# Patient Record
Sex: Female | Born: 1935
Health system: Southern US, Community
[De-identification: ages and names within clinical notes are randomized; demographics above are authoritative.]

## PROBLEM LIST (undated history)

## (undated) DIAGNOSIS — M25569 Pain in unspecified knee: Secondary | ICD-10-CM

## (undated) DIAGNOSIS — K921 Melena: Secondary | ICD-10-CM

## (undated) DIAGNOSIS — R05 Cough: Secondary | ICD-10-CM

## (undated) DIAGNOSIS — I8390 Asymptomatic varicose veins of unspecified lower extremity: Secondary | ICD-10-CM

## (undated) DIAGNOSIS — M549 Dorsalgia, unspecified: Secondary | ICD-10-CM

## (undated) DIAGNOSIS — S32020A Wedge compression fracture of second lumbar vertebra, initial encounter for closed fracture: Secondary | ICD-10-CM

## (undated) DIAGNOSIS — R0602 Shortness of breath: Secondary | ICD-10-CM

## (undated) DIAGNOSIS — I8310 Varicose veins of unspecified lower extremity with inflammation: Secondary | ICD-10-CM

## (undated) DIAGNOSIS — I679 Cerebrovascular disease, unspecified: Secondary | ICD-10-CM

## (undated) DIAGNOSIS — G319 Degenerative disease of nervous system, unspecified: Secondary | ICD-10-CM

## (undated) DIAGNOSIS — R251 Tremor, unspecified: Secondary | ICD-10-CM

## (undated) DIAGNOSIS — R413 Other amnesia: Secondary | ICD-10-CM

## (undated) DIAGNOSIS — R079 Chest pain, unspecified: Secondary | ICD-10-CM

## (undated) DIAGNOSIS — G2581 Restless legs syndrome: Secondary | ICD-10-CM

## (undated) DIAGNOSIS — N179 Acute kidney failure, unspecified: Secondary | ICD-10-CM

## (undated) DIAGNOSIS — S2341XA Sprain of ribs, initial encounter: Secondary | ICD-10-CM

## (undated) DIAGNOSIS — M25519 Pain in unspecified shoulder: Secondary | ICD-10-CM

## (undated) DIAGNOSIS — E559 Vitamin D deficiency, unspecified: Secondary | ICD-10-CM

## (undated) DIAGNOSIS — M25512 Pain in left shoulder: Secondary | ICD-10-CM

## (undated) DIAGNOSIS — R55 Syncope and collapse: Secondary | ICD-10-CM

## (undated) DIAGNOSIS — M545 Low back pain: Secondary | ICD-10-CM

## (undated) DIAGNOSIS — M25559 Pain in unspecified hip: Secondary | ICD-10-CM

## (undated) DIAGNOSIS — N8111 Cystocele, midline: Secondary | ICD-10-CM

## (undated) DIAGNOSIS — R5383 Other fatigue: Secondary | ICD-10-CM

## (undated) DIAGNOSIS — E669 Obesity, unspecified: Secondary | ICD-10-CM

## (undated) DIAGNOSIS — R1031 Right lower quadrant pain: Secondary | ICD-10-CM

## (undated) DIAGNOSIS — R918 Other nonspecific abnormal finding of lung field: Secondary | ICD-10-CM

## (undated) DIAGNOSIS — F32A Depression, unspecified: Secondary | ICD-10-CM

## (undated) DIAGNOSIS — N390 Urinary tract infection, site not specified: Secondary | ICD-10-CM

## (undated) DIAGNOSIS — E785 Hyperlipidemia, unspecified: Secondary | ICD-10-CM

## (undated) DIAGNOSIS — N1330 Unspecified hydronephrosis: Secondary | ICD-10-CM

## (undated) DIAGNOSIS — R3915 Urgency of urination: Secondary | ICD-10-CM

## (undated) DIAGNOSIS — M542 Cervicalgia: Secondary | ICD-10-CM

## (undated) DIAGNOSIS — R159 Full incontinence of feces: Secondary | ICD-10-CM

## (undated) DIAGNOSIS — Z86711 Personal history of pulmonary embolism: Secondary | ICD-10-CM

## (undated) DIAGNOSIS — F419 Anxiety disorder, unspecified: Secondary | ICD-10-CM

## (undated) DIAGNOSIS — F411 Generalized anxiety disorder: Secondary | ICD-10-CM

## (undated) DIAGNOSIS — K602 Anal fissure, unspecified: Secondary | ICD-10-CM

## (undated) DIAGNOSIS — H9190 Unspecified hearing loss, unspecified ear: Secondary | ICD-10-CM

## (undated) DIAGNOSIS — R351 Nocturia: Secondary | ICD-10-CM

## (undated) DIAGNOSIS — N183 Chronic kidney disease, stage 3 (moderate): Secondary | ICD-10-CM

## (undated) DIAGNOSIS — E1129 Type 2 diabetes mellitus with other diabetic kidney complication: Secondary | ICD-10-CM

## (undated) DIAGNOSIS — R0601 Orthopnea: Secondary | ICD-10-CM

## (undated) DIAGNOSIS — M810 Age-related osteoporosis without current pathological fracture: Secondary | ICD-10-CM

## (undated) DIAGNOSIS — F329 Major depressive disorder, single episode, unspecified: Secondary | ICD-10-CM

## (undated) DIAGNOSIS — R5381 Other malaise: Secondary | ICD-10-CM

## (undated) DIAGNOSIS — F423 Hoarding disorder: Secondary | ICD-10-CM

## (undated) DIAGNOSIS — F909 Attention-deficit hyperactivity disorder, unspecified type: Secondary | ICD-10-CM

## (undated) DIAGNOSIS — R1314 Dysphagia, pharyngoesophageal phase: Secondary | ICD-10-CM

## (undated) DIAGNOSIS — R109 Unspecified abdominal pain: Secondary | ICD-10-CM

## (undated) DIAGNOSIS — M25579 Pain in unspecified ankle and joints of unspecified foot: Secondary | ICD-10-CM

## (undated) DIAGNOSIS — G609 Hereditary and idiopathic neuropathy, unspecified: Secondary | ICD-10-CM

## (undated) DIAGNOSIS — K5900 Constipation, unspecified: Secondary | ICD-10-CM

## (undated) DIAGNOSIS — M6281 Muscle weakness (generalized): Secondary | ICD-10-CM

## (undated) DIAGNOSIS — K5730 Diverticulosis of large intestine without perforation or abscess without bleeding: Secondary | ICD-10-CM

## (undated) DIAGNOSIS — I10 Essential (primary) hypertension: Secondary | ICD-10-CM

## (undated) DIAGNOSIS — J189 Pneumonia, unspecified organism: Secondary | ICD-10-CM

## (undated) DIAGNOSIS — N83209 Unspecified ovarian cyst, unspecified side: Secondary | ICD-10-CM

## (undated) DIAGNOSIS — H9319 Tinnitus, unspecified ear: Secondary | ICD-10-CM

## (undated) DIAGNOSIS — M25551 Pain in right hip: Secondary | ICD-10-CM

## (undated) DIAGNOSIS — Z9181 History of falling: Secondary | ICD-10-CM

## (undated) DIAGNOSIS — M5430 Sciatica, unspecified side: Secondary | ICD-10-CM

## (undated) DIAGNOSIS — R269 Unspecified abnormalities of gait and mobility: Secondary | ICD-10-CM

## (undated) DIAGNOSIS — K210 Gastro-esophageal reflux disease with esophagitis: Secondary | ICD-10-CM

## (undated) DIAGNOSIS — Z86718 Personal history of other venous thrombosis and embolism: Secondary | ICD-10-CM

## (undated) DIAGNOSIS — J449 Chronic obstructive pulmonary disease, unspecified: Secondary | ICD-10-CM

## (undated) HISTORY — DX: Personal history of pulmonary embolism: Z86.711

## (undated) HISTORY — DX: Cerebrovascular disease, unspecified: I67.9

## (undated) HISTORY — DX: Sciatica, unspecified side: M54.30

## (undated) HISTORY — DX: Other fatigue: R53.83

## (undated) HISTORY — DX: Urinary tract infection, site not specified: N39.0

## (undated) HISTORY — DX: Hyperlipidemia, unspecified: E78.5

## (undated) HISTORY — PX: APPENDECTOMY: SHX54

## (undated) HISTORY — DX: Cervicalgia: M54.2

## (undated) HISTORY — DX: Chest pain, unspecified: R07.9

## (undated) HISTORY — DX: Hoarding disorder: F42.3

## (undated) HISTORY — DX: Obesity, unspecified: E66.9

## (undated) HISTORY — DX: Pain in unspecified hip: M25.559

## (undated) HISTORY — DX: Other nonspecific abnormal finding of lung field: R91.8

## (undated) HISTORY — DX: Unspecified abdominal pain: R10.9

## (undated) HISTORY — DX: Attention-deficit hyperactivity disorder, unspecified type: F90.9

## (undated) HISTORY — DX: Wedge compression fracture of second lumbar vertebra, initial encounter for closed fracture: S32.020A

## (undated) HISTORY — DX: Gastro-esophageal reflux disease with esophagitis: K21.0

## (undated) HISTORY — DX: Dorsalgia, unspecified: M54.9

## (undated) HISTORY — DX: Chronic obstructive pulmonary disease, unspecified: J44.9

## (undated) HISTORY — DX: Generalized anxiety disorder: F41.1

## (undated) HISTORY — PX: STAPEDECTOMY: SHX2435

## (undated) HISTORY — DX: Other amnesia: R41.3

## (undated) HISTORY — DX: Restless legs syndrome: G25.81

## (undated) HISTORY — DX: Anxiety disorder, unspecified: F41.9

## (undated) HISTORY — DX: Chronic kidney disease, stage 3 (moderate): N18.3

## (undated) HISTORY — DX: Vitamin D deficiency, unspecified: E55.9

## (undated) HISTORY — DX: Tremor, unspecified: R25.1

## (undated) HISTORY — DX: Acute kidney failure, unspecified: N17.9

## (undated) HISTORY — DX: Depression, unspecified: F32.A

## (undated) HISTORY — DX: Other malaise: R53.81

## (undated) HISTORY — DX: Full incontinence of feces: R15.9

## (undated) HISTORY — DX: Unspecified abnormalities of gait and mobility: R26.9

## (undated) HISTORY — DX: Melena: K92.1

## (undated) HISTORY — DX: Shortness of breath: R06.02

## (undated) HISTORY — DX: Muscle weakness (generalized): M62.81

## (undated) HISTORY — DX: Unspecified ovarian cyst, unspecified side: N83.209

## (undated) HISTORY — DX: Cough: R05

## (undated) HISTORY — DX: Diverticulosis of large intestine without perforation or abscess without bleeding: K57.30

## (undated) HISTORY — DX: Age-related osteoporosis without current pathological fracture: M81.0

## (undated) HISTORY — DX: Pain in unspecified knee: M25.569

## (undated) HISTORY — DX: Pain in unspecified ankle and joints of unspecified foot: M25.579

## (undated) HISTORY — DX: Personal history of other venous thrombosis and embolism: Z86.718

## (undated) HISTORY — DX: Unspecified hydronephrosis: N13.30

## (undated) HISTORY — DX: Asymptomatic varicose veins of unspecified lower extremity: I83.90

## (undated) HISTORY — DX: Pain in right hip: M25.551

## (undated) HISTORY — DX: Dysphagia, pharyngoesophageal phase: R13.14

## (undated) HISTORY — DX: Constipation, unspecified: K59.00

## (undated) HISTORY — PX: TONSILLECTOMY AND ADENOIDECTOMY: SUR1326

## (undated) HISTORY — DX: History of falling: Z91.81

## (undated) HISTORY — DX: Anal fissure, unspecified: K60.2

## (undated) HISTORY — DX: Tinnitus, unspecified ear: H93.19

## (undated) HISTORY — DX: Pain in left shoulder: M25.512

## (undated) HISTORY — DX: Type 2 diabetes mellitus with other diabetic kidney complication: E11.29

## (undated) HISTORY — DX: Nocturia: R35.1

## (undated) HISTORY — DX: Degenerative disease of nervous system, unspecified: G31.9

## (undated) HISTORY — DX: Pain in unspecified shoulder: M25.519

## (undated) HISTORY — DX: Cystocele, midline: N81.11

## (undated) HISTORY — DX: Major depressive disorder, single episode, unspecified: F32.9

## (undated) HISTORY — DX: Hereditary and idiopathic neuropathy, unspecified: G60.9

## (undated) HISTORY — DX: Sprain of ribs, initial encounter: S23.41XA

## (undated) HISTORY — DX: Pneumonia, unspecified organism: J18.9

## (undated) HISTORY — DX: Varicose veins of unspecified lower extremity with inflammation: I83.10

## (undated) HISTORY — DX: Low back pain: M54.5

## (undated) HISTORY — DX: Right lower quadrant pain: R10.31

## (undated) HISTORY — DX: Urgency of urination: R39.15

## (undated) HISTORY — DX: Syncope and collapse: R55

## (undated) HISTORY — DX: Orthopnea: R06.01

---

## 1986-03-06 HISTORY — PX: EYE SURGERY: SHX253

## 1998-01-21 ENCOUNTER — Ambulatory Visit (HOSPITAL_COMMUNITY): Admission: RE | Admit: 1998-01-21 | Discharge: 1998-01-21 | Payer: Self-pay | Admitting: Internal Medicine

## 1998-02-10 ENCOUNTER — Ambulatory Visit (HOSPITAL_COMMUNITY): Admission: RE | Admit: 1998-02-10 | Discharge: 1998-02-10 | Payer: Self-pay

## 1998-12-29 ENCOUNTER — Ambulatory Visit (HOSPITAL_COMMUNITY): Admission: RE | Admit: 1998-12-29 | Discharge: 1998-12-29 | Payer: Self-pay | Admitting: Internal Medicine

## 1998-12-29 ENCOUNTER — Encounter: Payer: Self-pay | Admitting: Internal Medicine

## 1999-02-17 ENCOUNTER — Encounter: Payer: Self-pay | Admitting: Internal Medicine

## 1999-02-17 ENCOUNTER — Ambulatory Visit (HOSPITAL_COMMUNITY): Admission: RE | Admit: 1999-02-17 | Discharge: 1999-02-17 | Payer: Self-pay | Admitting: Internal Medicine

## 1999-12-30 ENCOUNTER — Other Ambulatory Visit: Admission: RE | Admit: 1999-12-30 | Discharge: 1999-12-30 | Payer: Self-pay | Admitting: Internal Medicine

## 2001-02-20 DIAGNOSIS — M545 Low back pain, unspecified: Secondary | ICD-10-CM

## 2001-02-20 HISTORY — DX: Low back pain, unspecified: M54.50

## 2001-04-03 DIAGNOSIS — M5430 Sciatica, unspecified side: Secondary | ICD-10-CM

## 2001-04-03 HISTORY — DX: Sciatica, unspecified side: M54.30

## 2001-05-23 ENCOUNTER — Encounter: Payer: Self-pay | Admitting: Specialist

## 2001-05-23 ENCOUNTER — Encounter: Admission: RE | Admit: 2001-05-23 | Discharge: 2001-05-23 | Payer: Self-pay | Admitting: Specialist

## 2001-07-02 ENCOUNTER — Encounter: Payer: Self-pay | Admitting: Specialist

## 2001-07-02 ENCOUNTER — Encounter: Admission: RE | Admit: 2001-07-02 | Discharge: 2001-07-02 | Payer: Self-pay | Admitting: Specialist

## 2001-07-16 ENCOUNTER — Encounter: Admission: RE | Admit: 2001-07-16 | Discharge: 2001-07-16 | Payer: Self-pay | Admitting: Specialist

## 2001-07-16 ENCOUNTER — Encounter: Payer: Self-pay | Admitting: Specialist

## 2001-12-11 DIAGNOSIS — M25569 Pain in unspecified knee: Secondary | ICD-10-CM

## 2001-12-11 HISTORY — DX: Pain in unspecified knee: M25.569

## 2002-12-11 ENCOUNTER — Encounter: Admission: RE | Admit: 2002-12-11 | Discharge: 2002-12-11 | Payer: Self-pay | Admitting: Specialist

## 2002-12-11 ENCOUNTER — Encounter: Payer: Self-pay | Admitting: Specialist

## 2002-12-25 ENCOUNTER — Encounter: Admission: RE | Admit: 2002-12-25 | Discharge: 2002-12-25 | Payer: Self-pay | Admitting: Specialist

## 2002-12-25 ENCOUNTER — Encounter: Payer: Self-pay | Admitting: Specialist

## 2003-01-20 ENCOUNTER — Encounter: Admission: RE | Admit: 2003-01-20 | Discharge: 2003-01-20 | Payer: Self-pay | Admitting: Specialist

## 2003-01-23 DIAGNOSIS — M810 Age-related osteoporosis without current pathological fracture: Secondary | ICD-10-CM

## 2003-01-23 HISTORY — DX: Age-related osteoporosis without current pathological fracture: M81.0

## 2004-01-20 DIAGNOSIS — M542 Cervicalgia: Secondary | ICD-10-CM

## 2004-01-20 HISTORY — DX: Cervicalgia: M54.2

## 2004-09-03 HISTORY — PX: FRACTURE SURGERY: SHX138

## 2004-09-13 ENCOUNTER — Ambulatory Visit (HOSPITAL_BASED_OUTPATIENT_CLINIC_OR_DEPARTMENT_OTHER): Admission: RE | Admit: 2004-09-13 | Discharge: 2004-09-14 | Payer: Self-pay | Admitting: Orthopedic Surgery

## 2004-09-13 ENCOUNTER — Ambulatory Visit (HOSPITAL_COMMUNITY): Admission: RE | Admit: 2004-09-13 | Discharge: 2004-09-13 | Payer: Self-pay | Admitting: Orthopedic Surgery

## 2005-07-07 DIAGNOSIS — R351 Nocturia: Secondary | ICD-10-CM

## 2005-07-07 HISTORY — DX: Nocturia: R35.1

## 2006-01-16 DIAGNOSIS — G2581 Restless legs syndrome: Secondary | ICD-10-CM

## 2006-01-16 HISTORY — DX: Restless legs syndrome: G25.81

## 2006-04-25 DIAGNOSIS — I8310 Varicose veins of unspecified lower extremity with inflammation: Secondary | ICD-10-CM

## 2006-04-25 HISTORY — DX: Varicose veins of unspecified lower extremity with inflammation: I83.10

## 2007-07-19 DIAGNOSIS — R079 Chest pain, unspecified: Secondary | ICD-10-CM

## 2007-07-19 DIAGNOSIS — E559 Vitamin D deficiency, unspecified: Secondary | ICD-10-CM

## 2007-07-19 HISTORY — DX: Vitamin D deficiency, unspecified: E55.9

## 2007-07-19 HISTORY — DX: Chest pain, unspecified: R07.9

## 2007-10-18 DIAGNOSIS — K2100 Gastro-esophageal reflux disease with esophagitis, without bleeding: Secondary | ICD-10-CM

## 2007-10-18 HISTORY — DX: Gastro-esophageal reflux disease with esophagitis, without bleeding: K21.00

## 2008-07-16 DIAGNOSIS — N1330 Unspecified hydronephrosis: Secondary | ICD-10-CM

## 2008-07-16 DIAGNOSIS — E669 Obesity, unspecified: Secondary | ICD-10-CM

## 2008-07-16 HISTORY — DX: Obesity, unspecified: E66.9

## 2008-07-16 HISTORY — DX: Unspecified hydronephrosis: N13.30

## 2008-09-22 ENCOUNTER — Encounter: Admission: RE | Admit: 2008-09-22 | Discharge: 2008-09-22 | Payer: Self-pay | Admitting: Chiropractic Medicine

## 2009-02-15 DIAGNOSIS — R5383 Other fatigue: Secondary | ICD-10-CM

## 2009-02-15 DIAGNOSIS — R5381 Other malaise: Secondary | ICD-10-CM

## 2009-02-15 HISTORY — DX: Other fatigue: R53.83

## 2009-02-15 HISTORY — DX: Other malaise: R53.81

## 2009-07-29 DIAGNOSIS — M25559 Pain in unspecified hip: Secondary | ICD-10-CM

## 2009-07-29 DIAGNOSIS — Z9181 History of falling: Secondary | ICD-10-CM

## 2009-07-29 HISTORY — DX: History of falling: Z91.81

## 2009-07-29 HISTORY — DX: Pain in unspecified hip: M25.559

## 2009-07-30 ENCOUNTER — Encounter: Admission: RE | Admit: 2009-07-30 | Discharge: 2009-07-30 | Payer: Self-pay | Admitting: Internal Medicine

## 2009-10-25 ENCOUNTER — Encounter: Admission: RE | Admit: 2009-10-25 | Discharge: 2009-10-25 | Payer: Self-pay | Admitting: Specialist

## 2009-11-10 ENCOUNTER — Encounter: Admission: RE | Admit: 2009-11-10 | Discharge: 2009-11-10 | Payer: Self-pay | Admitting: Specialist

## 2009-11-15 DIAGNOSIS — M25519 Pain in unspecified shoulder: Secondary | ICD-10-CM

## 2009-11-15 HISTORY — DX: Pain in unspecified shoulder: M25.519

## 2009-12-01 ENCOUNTER — Encounter: Admission: RE | Admit: 2009-12-01 | Discharge: 2009-12-01 | Payer: Self-pay | Admitting: Gastroenterology

## 2009-12-09 ENCOUNTER — Encounter: Admission: RE | Admit: 2009-12-09 | Discharge: 2009-12-09 | Payer: Self-pay | Admitting: Gastroenterology

## 2009-12-10 DIAGNOSIS — N179 Acute kidney failure, unspecified: Secondary | ICD-10-CM

## 2009-12-10 HISTORY — DX: Acute kidney failure, unspecified: N17.9

## 2009-12-14 ENCOUNTER — Encounter: Admission: RE | Admit: 2009-12-14 | Discharge: 2009-12-14 | Payer: Self-pay | Admitting: Internal Medicine

## 2009-12-15 DIAGNOSIS — N183 Chronic kidney disease, stage 3 unspecified: Secondary | ICD-10-CM

## 2009-12-15 HISTORY — DX: Chronic kidney disease, stage 3 unspecified: N18.30

## 2010-01-05 DIAGNOSIS — R0602 Shortness of breath: Secondary | ICD-10-CM

## 2010-01-05 HISTORY — DX: Shortness of breath: R06.02

## 2010-01-06 ENCOUNTER — Encounter: Admission: RE | Admit: 2010-01-06 | Discharge: 2010-01-06 | Payer: Self-pay | Admitting: Gastroenterology

## 2010-02-14 DIAGNOSIS — F411 Generalized anxiety disorder: Secondary | ICD-10-CM

## 2010-02-14 HISTORY — DX: Generalized anxiety disorder: F41.1

## 2010-03-23 ENCOUNTER — Encounter
Admission: RE | Admit: 2010-03-23 | Discharge: 2010-03-23 | Payer: Self-pay | Source: Home / Self Care | Attending: Specialist | Admitting: Specialist

## 2010-05-02 ENCOUNTER — Other Ambulatory Visit: Payer: Self-pay | Admitting: Internal Medicine

## 2010-05-02 ENCOUNTER — Ambulatory Visit
Admission: RE | Admit: 2010-05-02 | Discharge: 2010-05-02 | Disposition: A | Discharge status: Home / Self Care | Payer: Medicare Other | Source: Ambulatory Visit | Attending: Internal Medicine | Admitting: Internal Medicine

## 2010-05-02 ENCOUNTER — Ambulatory Visit
Admission: RE | Admit: 2010-05-02 | Discharge: 2010-05-02 | Disposition: A | Discharge status: Home / Self Care | Payer: Self-pay | Source: Ambulatory Visit | Attending: Internal Medicine | Admitting: Internal Medicine

## 2010-05-02 ENCOUNTER — Ambulatory Visit: Admission: RE | Admit: 2010-05-02 | Payer: Medicare Other | Source: Ambulatory Visit

## 2010-05-02 DIAGNOSIS — K37 Unspecified appendicitis: Secondary | ICD-10-CM

## 2010-05-02 DIAGNOSIS — R1031 Right lower quadrant pain: Secondary | ICD-10-CM

## 2010-05-02 HISTORY — DX: Right lower quadrant pain: R10.31

## 2010-05-04 DIAGNOSIS — J189 Pneumonia, unspecified organism: Secondary | ICD-10-CM

## 2010-05-04 DIAGNOSIS — N83209 Unspecified ovarian cyst, unspecified side: Secondary | ICD-10-CM

## 2010-05-04 HISTORY — DX: Pneumonia, unspecified organism: J18.9

## 2010-05-04 HISTORY — DX: Unspecified ovarian cyst, unspecified side: N83.209

## 2010-07-04 DIAGNOSIS — H9319 Tinnitus, unspecified ear: Secondary | ICD-10-CM

## 2010-07-04 HISTORY — DX: Tinnitus, unspecified ear: H93.19

## 2010-07-22 NOTE — Op Note (Signed)
NAMEMAHI, ZABRISKIE                ACCOUNT NO.:  [id number]   MEDICAL RECORD NO.:  [id number]          PATIENT TYPE:  AMB   LOCATION:  DSC                          FACILITY:  [facility]   PHYSICIAN:  Katy Fitch. Sypher, M.D. DATE OF BIRTH:  09-Oct-1921   DATE OF PROCEDURE:  09/13/2004  DATE OF DISCHARGE:                                 OPERATIVE REPORT   PREOPERATIVE DIAGNOSIS:  Severely comminuted displaced fracture of the right  distal radius with loss of length and more than 45 degrees dorsal  angulation.   POSTOPERATIVE DIAGNOSIS:  Severely comminuted displaced fracture of the  right distal radius with loss of length and more than 45 degrees dorsal  angulation.   OPERATION:  Open reduction internal fixation of right radius utilizing seven  peg DVR plate system with abundant cancellus allograft to buttress the  distal radial endplate.   OPERATING SURGEON:  Katy Fitch. Sypher, M.D.   ASSISTANT:  Marveen Reeks Dasnoit, P.A.-C.   ANESTHESIA:  Infraclavicular block by supplemented by IV sedation,  supervising anesthesiologist Dr. Isidor Holts.   ESTIMATED BLOOD LOSS:  Minimal.   COMPLICATIONS:  None apparent.   TOURNIQUET:  260 mmHg later lowered to 230 mmHg due to declining systolic  hypertension.   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  On September 10, 2004 she fell  sustaining a severely comminuted fracture of right distal radius.   She was initially assessed at [facility] where she was noted  on x-ray to have a profoundly comminuted fracture of the right distal  radius.   The [facility] contacted [facility] and made  arrangements for follow-up with an acute clinic physician there.   Reportedly Dr. Lequita Halt evaluated the films and requested hand surgery  consult due to the severely comminuted nature of the fracture.   Christine Pierce was seen in consultation on  September 12, 2004 at the [facility]  [place] where her complex fracture predicament was assessed and  recommendations provided.   We recommended proceeding with the open reduction internal fixation of her  fracture utilizing a 7-peg DVR plate system and bone graft.   After informed consent, she is brought to the operating room at this time.   PROCEDURE:  Yuma Blucher is brought to the operating room and placed in  supine position on the operating table. Following placement of an  infraclavicular block by Dr. Gelene Mink in the holding area, anesthesia was  complete in the right upper extremity.   Christine Pierce was transferred to room one and placed in supine position on the  operating table. Following sedation, the right arm was prepped with Betadine  soap solution, sterilely draped. A pneumatic tourniquet was applied to the  proximal brachium.   Following exsanguination of the arm with an Esmarch bandage, the arterial  tourniquet on the proximal brachium was initially inflated to 260 mmHg due  to systolic hypertension. During the procedure this was lowered to 230 mmHg  as her pressure gradually settled.   Procedure  commenced with a DVR incision paralleling the path of the flexor  carpi radialis.   The flexor pollicis longus was approached by retraction of the flexor carpi  radialis and incision of the floor of its compartment. The flexor pollicis  longus was retracted in an ulnar direction, followed by elevation of the  pronator quadratus. The fracture site was severely comminuted dorsally.   After periosteal elevation, radially and dorsally, the proximal shaft was  rotated 90 degrees out of phase and a Freer and fine forceps used to remove  all clot and debris.   There was profound comminution of the dorsal cortex.   Approximately 15 cc of cancellus allograft was meticulously packed beneath  the joint surface to support the articular surface and to make up for the   comminution of the dorsal cortex. This required some careful planning and  application of the graft.   We subsequently rotated the metaphysis back into anatomic position and  realigned the volar cortex. Additional bone graft was used to buttress the  radial styloid.   A seven peg DVR plate system was then applied with standard technique  utilizing the gliding hole for proper plate placement, followed by x-ray  control to place seven threaded and smooth pegs capturing the distal  metaphyseal and radial styloid fragments.   An anatomic reduction of the fracture was achieved.   The ulna was stable and the distal radioulnar joint was noted to be stable  after the fracture was reduced.   After placement of the pegs, the cancellus screws were placed with x-ray  control.   The fracture site was then irrigated. Hemostasis achieved followed by repair  the pronator quadratus to cover the distal plate with mattress suture of 0  Vicryl followed by repair of the skin with subdermal suture of 2-0 Vicryl  and repair of the wound with intradermal 3-0 Prolene and Steri-Strips.   There were no apparent complications.   Christine Pierce tolerated surgery and anesthesia well. She was transferred to  recovery with stable vital signs.       RVS/MEDQ  D:  09/13/2004  T:  09/13/2004  Job:  [id number]

## 2010-08-15 DIAGNOSIS — S2341XA Sprain of ribs, initial encounter: Secondary | ICD-10-CM

## 2010-08-15 HISTORY — DX: Sprain of ribs, initial encounter: S23.41XA

## 2010-12-27 DIAGNOSIS — M25579 Pain in unspecified ankle and joints of unspecified foot: Secondary | ICD-10-CM

## 2010-12-27 HISTORY — DX: Pain in unspecified ankle and joints of unspecified foot: M25.579

## 2011-03-14 DIAGNOSIS — M25579 Pain in unspecified ankle and joints of unspecified foot: Secondary | ICD-10-CM | POA: Diagnosis not present

## 2011-03-14 DIAGNOSIS — M1710 Unilateral primary osteoarthritis, unspecified knee: Secondary | ICD-10-CM | POA: Diagnosis not present

## 2011-03-20 DIAGNOSIS — N390 Urinary tract infection, site not specified: Secondary | ICD-10-CM | POA: Diagnosis not present

## 2011-03-20 DIAGNOSIS — I120 Hypertensive chronic kidney disease with stage 5 chronic kidney disease or end stage renal disease: Secondary | ICD-10-CM | POA: Diagnosis not present

## 2011-03-21 DIAGNOSIS — M1710 Unilateral primary osteoarthritis, unspecified knee: Secondary | ICD-10-CM | POA: Diagnosis not present

## 2011-03-28 DIAGNOSIS — M25579 Pain in unspecified ankle and joints of unspecified foot: Secondary | ICD-10-CM | POA: Diagnosis not present

## 2011-03-29 DIAGNOSIS — R55 Syncope and collapse: Secondary | ICD-10-CM

## 2011-03-29 DIAGNOSIS — I10 Essential (primary) hypertension: Secondary | ICD-10-CM | POA: Diagnosis not present

## 2011-03-29 HISTORY — DX: Syncope and collapse: R55

## 2011-03-31 DIAGNOSIS — M25579 Pain in unspecified ankle and joints of unspecified foot: Secondary | ICD-10-CM | POA: Diagnosis not present

## 2011-04-04 DIAGNOSIS — M25579 Pain in unspecified ankle and joints of unspecified foot: Secondary | ICD-10-CM | POA: Diagnosis not present

## 2011-04-07 DIAGNOSIS — M25579 Pain in unspecified ankle and joints of unspecified foot: Secondary | ICD-10-CM | POA: Diagnosis not present

## 2011-04-11 DIAGNOSIS — M25579 Pain in unspecified ankle and joints of unspecified foot: Secondary | ICD-10-CM | POA: Diagnosis not present

## 2011-04-14 DIAGNOSIS — M25579 Pain in unspecified ankle and joints of unspecified foot: Secondary | ICD-10-CM | POA: Diagnosis not present

## 2011-04-17 DIAGNOSIS — M25579 Pain in unspecified ankle and joints of unspecified foot: Secondary | ICD-10-CM | POA: Diagnosis not present

## 2011-04-18 DIAGNOSIS — M25579 Pain in unspecified ankle and joints of unspecified foot: Secondary | ICD-10-CM | POA: Diagnosis not present

## 2011-04-19 DIAGNOSIS — R55 Syncope and collapse: Secondary | ICD-10-CM | POA: Diagnosis not present

## 2011-04-25 DIAGNOSIS — M48061 Spinal stenosis, lumbar region without neurogenic claudication: Secondary | ICD-10-CM | POA: Diagnosis not present

## 2011-05-09 DIAGNOSIS — M48061 Spinal stenosis, lumbar region without neurogenic claudication: Secondary | ICD-10-CM | POA: Diagnosis not present

## 2011-05-10 DIAGNOSIS — M79609 Pain in unspecified limb: Secondary | ICD-10-CM | POA: Diagnosis not present

## 2011-05-10 DIAGNOSIS — B351 Tinea unguium: Secondary | ICD-10-CM | POA: Diagnosis not present

## 2011-05-16 DIAGNOSIS — M25579 Pain in unspecified ankle and joints of unspecified foot: Secondary | ICD-10-CM | POA: Diagnosis not present

## 2011-06-14 DIAGNOSIS — Z1231 Encounter for screening mammogram for malignant neoplasm of breast: Secondary | ICD-10-CM | POA: Diagnosis not present

## 2011-06-27 DIAGNOSIS — M25579 Pain in unspecified ankle and joints of unspecified foot: Secondary | ICD-10-CM | POA: Diagnosis not present

## 2011-08-01 DIAGNOSIS — I10 Essential (primary) hypertension: Secondary | ICD-10-CM | POA: Diagnosis not present

## 2011-08-01 DIAGNOSIS — R7989 Other specified abnormal findings of blood chemistry: Secondary | ICD-10-CM | POA: Diagnosis not present

## 2011-08-01 DIAGNOSIS — E785 Hyperlipidemia, unspecified: Secondary | ICD-10-CM | POA: Diagnosis not present

## 2011-08-01 DIAGNOSIS — E119 Type 2 diabetes mellitus without complications: Secondary | ICD-10-CM | POA: Diagnosis not present

## 2011-08-01 DIAGNOSIS — E559 Vitamin D deficiency, unspecified: Secondary | ICD-10-CM | POA: Diagnosis not present

## 2011-08-01 DIAGNOSIS — Z79899 Other long term (current) drug therapy: Secondary | ICD-10-CM | POA: Diagnosis not present

## 2011-08-09 DIAGNOSIS — M79609 Pain in unspecified limb: Secondary | ICD-10-CM | POA: Diagnosis not present

## 2011-08-09 DIAGNOSIS — B351 Tinea unguium: Secondary | ICD-10-CM | POA: Diagnosis not present

## 2011-08-17 DIAGNOSIS — N2581 Secondary hyperparathyroidism of renal origin: Secondary | ICD-10-CM | POA: Diagnosis not present

## 2011-08-17 DIAGNOSIS — I10 Essential (primary) hypertension: Secondary | ICD-10-CM | POA: Diagnosis not present

## 2011-08-17 DIAGNOSIS — D649 Anemia, unspecified: Secondary | ICD-10-CM | POA: Diagnosis not present

## 2011-08-17 DIAGNOSIS — I129 Hypertensive chronic kidney disease with stage 1 through stage 4 chronic kidney disease, or unspecified chronic kidney disease: Secondary | ICD-10-CM | POA: Diagnosis not present

## 2011-08-17 DIAGNOSIS — N390 Urinary tract infection, site not specified: Secondary | ICD-10-CM | POA: Diagnosis not present

## 2011-08-22 DIAGNOSIS — Z79899 Other long term (current) drug therapy: Secondary | ICD-10-CM | POA: Diagnosis not present

## 2011-08-22 DIAGNOSIS — E119 Type 2 diabetes mellitus without complications: Secondary | ICD-10-CM | POA: Diagnosis not present

## 2011-08-22 DIAGNOSIS — I10 Essential (primary) hypertension: Secondary | ICD-10-CM | POA: Diagnosis not present

## 2011-08-22 DIAGNOSIS — M48061 Spinal stenosis, lumbar region without neurogenic claudication: Secondary | ICD-10-CM | POA: Diagnosis not present

## 2011-08-22 DIAGNOSIS — N390 Urinary tract infection, site not specified: Secondary | ICD-10-CM | POA: Diagnosis not present

## 2011-08-22 DIAGNOSIS — E559 Vitamin D deficiency, unspecified: Secondary | ICD-10-CM | POA: Diagnosis not present

## 2011-08-22 DIAGNOSIS — E785 Hyperlipidemia, unspecified: Secondary | ICD-10-CM | POA: Diagnosis not present

## 2011-09-11 DIAGNOSIS — N390 Urinary tract infection, site not specified: Secondary | ICD-10-CM | POA: Diagnosis not present

## 2011-09-11 DIAGNOSIS — M6281 Muscle weakness (generalized): Secondary | ICD-10-CM | POA: Diagnosis not present

## 2011-09-11 DIAGNOSIS — R0601 Orthopnea: Secondary | ICD-10-CM | POA: Diagnosis not present

## 2011-09-11 DIAGNOSIS — R0602 Shortness of breath: Secondary | ICD-10-CM | POA: Diagnosis not present

## 2011-09-11 DIAGNOSIS — R5381 Other malaise: Secondary | ICD-10-CM | POA: Diagnosis not present

## 2011-09-11 HISTORY — DX: Urinary tract infection, site not specified: N39.0

## 2011-09-11 HISTORY — DX: Muscle weakness (generalized): M62.81

## 2011-09-18 DIAGNOSIS — Z961 Presence of intraocular lens: Secondary | ICD-10-CM | POA: Diagnosis not present

## 2011-09-18 DIAGNOSIS — H52209 Unspecified astigmatism, unspecified eye: Secondary | ICD-10-CM | POA: Diagnosis not present

## 2011-09-18 DIAGNOSIS — H2640 Unspecified secondary cataract: Secondary | ICD-10-CM | POA: Diagnosis not present

## 2011-09-18 DIAGNOSIS — H43819 Vitreous degeneration, unspecified eye: Secondary | ICD-10-CM | POA: Diagnosis not present

## 2011-09-26 DIAGNOSIS — M48061 Spinal stenosis, lumbar region without neurogenic claudication: Secondary | ICD-10-CM | POA: Diagnosis not present

## 2011-10-16 DIAGNOSIS — R5381 Other malaise: Secondary | ICD-10-CM | POA: Diagnosis not present

## 2011-10-16 DIAGNOSIS — N390 Urinary tract infection, site not specified: Secondary | ICD-10-CM | POA: Diagnosis not present

## 2011-10-16 DIAGNOSIS — E119 Type 2 diabetes mellitus without complications: Secondary | ICD-10-CM | POA: Diagnosis not present

## 2011-10-16 DIAGNOSIS — I10 Essential (primary) hypertension: Secondary | ICD-10-CM | POA: Diagnosis not present

## 2011-10-16 DIAGNOSIS — E785 Hyperlipidemia, unspecified: Secondary | ICD-10-CM | POA: Diagnosis not present

## 2011-10-18 DIAGNOSIS — I10 Essential (primary) hypertension: Secondary | ICD-10-CM | POA: Diagnosis not present

## 2011-10-18 DIAGNOSIS — E119 Type 2 diabetes mellitus without complications: Secondary | ICD-10-CM | POA: Diagnosis not present

## 2011-10-18 DIAGNOSIS — M6281 Muscle weakness (generalized): Secondary | ICD-10-CM | POA: Diagnosis not present

## 2011-10-20 DIAGNOSIS — E785 Hyperlipidemia, unspecified: Secondary | ICD-10-CM

## 2011-10-20 HISTORY — DX: Hyperlipidemia, unspecified: E78.5

## 2011-10-23 DIAGNOSIS — I10 Essential (primary) hypertension: Secondary | ICD-10-CM

## 2011-10-23 HISTORY — DX: Essential (primary) hypertension: I10

## 2011-11-12 DIAGNOSIS — Z23 Encounter for immunization: Secondary | ICD-10-CM | POA: Diagnosis not present

## 2011-11-16 DIAGNOSIS — B351 Tinea unguium: Secondary | ICD-10-CM | POA: Diagnosis not present

## 2011-11-16 DIAGNOSIS — M79609 Pain in unspecified limb: Secondary | ICD-10-CM | POA: Diagnosis not present

## 2011-12-01 DIAGNOSIS — R0601 Orthopnea: Secondary | ICD-10-CM

## 2011-12-01 HISTORY — DX: Orthopnea: R06.01

## 2012-04-01 DIAGNOSIS — B351 Tinea unguium: Secondary | ICD-10-CM | POA: Diagnosis not present

## 2012-04-01 DIAGNOSIS — M79609 Pain in unspecified limb: Secondary | ICD-10-CM | POA: Diagnosis not present

## 2012-04-10 DIAGNOSIS — E119 Type 2 diabetes mellitus without complications: Secondary | ICD-10-CM | POA: Diagnosis not present

## 2012-04-10 DIAGNOSIS — R05 Cough: Secondary | ICD-10-CM | POA: Diagnosis not present

## 2012-04-10 DIAGNOSIS — R059 Cough, unspecified: Secondary | ICD-10-CM

## 2012-04-10 DIAGNOSIS — N8111 Cystocele, midline: Secondary | ICD-10-CM

## 2012-04-10 DIAGNOSIS — N390 Urinary tract infection, site not specified: Secondary | ICD-10-CM | POA: Diagnosis not present

## 2012-04-10 DIAGNOSIS — R3915 Urgency of urination: Secondary | ICD-10-CM

## 2012-04-10 DIAGNOSIS — R159 Full incontinence of feces: Secondary | ICD-10-CM | POA: Diagnosis not present

## 2012-04-10 DIAGNOSIS — E559 Vitamin D deficiency, unspecified: Secondary | ICD-10-CM | POA: Diagnosis not present

## 2012-04-10 HISTORY — DX: Cystocele, midline: N81.11

## 2012-04-10 HISTORY — DX: Cough, unspecified: R05.9

## 2012-04-10 HISTORY — DX: Full incontinence of feces: R15.9

## 2012-04-10 HISTORY — DX: Urgency of urination: R39.15

## 2012-04-11 ENCOUNTER — Other Ambulatory Visit: Payer: Self-pay | Admitting: Internal Medicine

## 2012-04-11 ENCOUNTER — Emergency Department (HOSPITAL_COMMUNITY): Payer: Medicare Other

## 2012-04-11 ENCOUNTER — Encounter (HOSPITAL_COMMUNITY): Payer: Self-pay

## 2012-04-11 ENCOUNTER — Emergency Department (HOSPITAL_COMMUNITY)
Admission: EM | Admit: 2012-04-11 | Discharge: 2012-04-11 | Disposition: A | Discharge status: Home / Self Care | Payer: Medicare Other | Attending: Emergency Medicine | Admitting: Emergency Medicine

## 2012-04-11 DIAGNOSIS — I10 Essential (primary) hypertension: Secondary | ICD-10-CM | POA: Insufficient documentation

## 2012-04-11 DIAGNOSIS — R6889 Other general symptoms and signs: Secondary | ICD-10-CM | POA: Diagnosis not present

## 2012-04-11 DIAGNOSIS — M25559 Pain in unspecified hip: Secondary | ICD-10-CM | POA: Diagnosis not present

## 2012-04-11 DIAGNOSIS — Z7982 Long term (current) use of aspirin: Secondary | ICD-10-CM | POA: Insufficient documentation

## 2012-04-11 DIAGNOSIS — S199XXA Unspecified injury of neck, initial encounter: Secondary | ICD-10-CM | POA: Diagnosis not present

## 2012-04-11 DIAGNOSIS — Z79899 Other long term (current) drug therapy: Secondary | ICD-10-CM | POA: Insufficient documentation

## 2012-04-11 DIAGNOSIS — Y939 Activity, unspecified: Secondary | ICD-10-CM | POA: Insufficient documentation

## 2012-04-11 DIAGNOSIS — M25569 Pain in unspecified knee: Secondary | ICD-10-CM | POA: Diagnosis not present

## 2012-04-11 DIAGNOSIS — E785 Hyperlipidemia, unspecified: Secondary | ICD-10-CM | POA: Diagnosis not present

## 2012-04-11 DIAGNOSIS — R51 Headache: Secondary | ICD-10-CM | POA: Diagnosis not present

## 2012-04-11 DIAGNOSIS — Z8739 Personal history of other diseases of the musculoskeletal system and connective tissue: Secondary | ICD-10-CM | POA: Diagnosis not present

## 2012-04-11 DIAGNOSIS — M542 Cervicalgia: Secondary | ICD-10-CM | POA: Diagnosis not present

## 2012-04-11 DIAGNOSIS — E559 Vitamin D deficiency, unspecified: Secondary | ICD-10-CM | POA: Diagnosis not present

## 2012-04-11 DIAGNOSIS — R079 Chest pain, unspecified: Secondary | ICD-10-CM

## 2012-04-11 DIAGNOSIS — R55 Syncope and collapse: Secondary | ICD-10-CM | POA: Diagnosis not present

## 2012-04-11 DIAGNOSIS — R5381 Other malaise: Secondary | ICD-10-CM | POA: Diagnosis not present

## 2012-04-11 DIAGNOSIS — Y9229 Other specified public building as the place of occurrence of the external cause: Secondary | ICD-10-CM | POA: Insufficient documentation

## 2012-04-11 DIAGNOSIS — W1809XA Striking against other object with subsequent fall, initial encounter: Secondary | ICD-10-CM | POA: Insufficient documentation

## 2012-04-11 DIAGNOSIS — W19XXXA Unspecified fall, initial encounter: Secondary | ICD-10-CM

## 2012-04-11 DIAGNOSIS — E119 Type 2 diabetes mellitus without complications: Secondary | ICD-10-CM | POA: Diagnosis not present

## 2012-04-11 DIAGNOSIS — S1093XA Contusion of unspecified part of neck, initial encounter: Secondary | ICD-10-CM | POA: Diagnosis not present

## 2012-04-11 DIAGNOSIS — S0083XA Contusion of other part of head, initial encounter: Secondary | ICD-10-CM | POA: Diagnosis not present

## 2012-04-11 HISTORY — DX: Essential (primary) hypertension: I10

## 2012-04-11 NOTE — Progress Notes (Signed)
During 04/11/12 ed visit confirmed with pt pcp is arthur green epic updated

## 2012-04-11 NOTE — ED Notes (Signed)
Per EMS pt states at the bank standing in line and went to look around and fell backwards hitting the back of her head. Pt denies LOC, states lt pupil is dilated which is abnormal, ice applied to bump back of pts head. No other complaints noted

## 2012-04-11 NOTE — ED Provider Notes (Signed)
History     CSN: [id number]  Arrival date & time 04/11/12  1411   First MD Initiated Contact with Patient 04/11/12 1513      Chief Complaint  Patient presents with  . Fall    (Consider location/radiation/quality/duration/timing/severity/associated sxs/prior treatment) HPI  Patient states she felt fine today and she ate lunch. After eating lunch she went to the bank and was standing at the tellers window. She states she felt fine however she turned her head to look at her banker's office to her left and the next thing she knew she was on the floor. She denies loss of consciousness. She denies feeling dizziness or having any warning that she was going to go down or pass out. States she has mild neck stiffness, but has intact ROM. She denies numbness or weakness in her extremities. She has pain in her right hip and states she hit the  right side of her head and has a "watermelon" on her head. States the swelling is better.  Pt states she had blood work and xrays this morning at her doctors office.   PCP Dr Gordy Levan Nephrology Dr Deterding Orthopedist Dr Jillyn Hidden  Past Medical History  Diagnosis Date  . Hypertension   . Renal disorder   CRI Chronic back pain  History reviewed. No pertinent past surgical history.  No family history on file.  History  Substance Use Topics  . Smoking status: No  . Smokeless tobacco: Not on file  . Alcohol Use: No  Lives at home Lives alone Widowed Retired Programmer, systems  OB History    Grav Para Term Preterm Abortions TAB SAB Ect Mult Living                  Review of Systems  All other systems reviewed and are negative.    Allergies  Review of patient's allergies indicates no known allergies.  Home Medications   Current Outpatient Rx  Name  Route  Sig  Dispense  Refill  . ASPIRIN EC 81 MG PO TBEC   Oral   Take 81 mg by mouth daily.         Marland Kitchen LOSARTAN POTASSIUM-HCTZ 50-12.5 MG PO TABS   Oral   Take 1 tablet by mouth daily.         Marland Kitchen LOVASTATIN 40 MG PO TABS   Oral   Take 40 mg by mouth daily.           BP 132/87  Pulse 76  Temp 97.9 F (36.6 C) (Oral)  Resp 16  SpO2 95%  Vital signs normal    Physical Exam  Nursing note and vitals reviewed. Constitutional: She is oriented to person, place, and time. She appears well-developed and well-nourished.  Non-toxic appearance. She does not appear ill. No distress.  HENT:  Head: Normocephalic.  Right Ear: External ear normal.  Left Ear: External ear normal.  Nose: Nose normal. No mucosal edema or rhinorrhea.  Mouth/Throat: Oropharynx is clear and moist and mucous membranes are normal. No dental abscesses or uvula swelling.       Has swelling on the top of her scalp just to the right of midline posteriorly  Eyes: Conjunctivae normal and EOM are normal. Pupils are equal, round, and reactive to light.       Let pupil slightly larger than right but has a cataract  Neck: Normal range of motion and full passive range of motion without pain. Neck supple.  Cardiovascular: Normal rate, regular rhythm and  normal heart sounds.  Exam reveals no gallop and no friction rub.   No murmur heard. Pulmonary/Chest: Effort normal and breath sounds normal. No respiratory distress. She has no wheezes. She has no rhonchi. She has no rales. She exhibits no tenderness and no crepitus.  Abdominal: Soft. Normal appearance and bowel sounds are normal. She exhibits no distension. There is no tenderness. There is no rebound and no guarding.  Musculoskeletal: Normal range of motion. She exhibits no edema and no tenderness.       Legs:      Moves all extremities well. Tender in the right posterior hip  Neurological: She is alert and oriented to person, place, and time. She has normal strength. No cranial nerve deficit.  Skin: Skin is warm, dry and intact. No rash noted. No erythema. No pallor.  Psychiatric: She has a normal mood and affect. Her speech is normal and behavior is normal. Her mood  appears not anxious.    ED Course  Procedures (including critical care time)  Pt able to ambulate without difficulty. Feels ready to go home. States Dr Darrick Penna has told her the only medication she can take for pain is tylenol    Dg Hip Complete Right  04/11/2012  *RADIOLOGY REPORT*  Clinical Data: Right buttock pain  RIGHT HIP - COMPLETE 2+ VIEW  Comparison: None.  Findings: No acute fracture and no dislocation.  Mild degenerative change of the hip and SI joints.  IUD device projects over the pelvis.  IMPRESSION: No acute bony pathology.   Original Report Authenticated By: Jolaine Click, M.D.    Ct Head Wo Contrast  Ct Cervical Spine Wo Contrast  04/11/2012  *RADIOLOGY REPORT*  Clinical Data:  Status post fall with possible loss of consciousness.  Headache and neck pain.  CT HEAD WITHOUT CONTRAST CT CERVICAL SPINE WITHOUT CONTRAST  Technique:  Multidetector CT imaging of the head and cervical spine was performed following the standard protocol without intravenous contrast.  Multiplanar CT image reconstructions of the cervical spine were also generated.  Comparison:   None  CT HEAD  Findings: There is some cortical atrophy and chronic microvascular ischemic change.  No evidence of acute abnormality including infarct, hemorrhage, mass lesion, mass effect, midline shift or abnormal extra-axial fluid collection is identified.  Scalp contusion near the vertex on the right is identified.  No fracture is seen.  IMPRESSION:  1.  Scalp contusion near the vertex on the right without underlying fracture or acute intracranial abnormality. 2.  Mild appearing atrophy and chronic microvascular ischemic change.  CT CERVICAL SPINE  Findings: No fracture or subluxation of the cervical spine is identified.  The patient has multilevel degenerative disease predominantly involving the facet joints.  Lung apices are clear.  IMPRESSION: No acute finding.  Degenerative disease.   Original Report Authenticated By: Holley Dexter, M.D.      1. Fall   2. Near syncope     Plan discharge  Devoria Albe, MD, FACEP   MDM          Ward Givens, MD 04/11/12 [phone]

## 2012-04-11 NOTE — ED Notes (Signed)
[id number]<GN> Expected date:<BR> Expected time:<BR> Means of arrival:<BR> Comments:<BR> Hold for [employer]

## 2012-04-12 ENCOUNTER — Other Ambulatory Visit: Payer: Medicare Other

## 2012-04-18 ENCOUNTER — Other Ambulatory Visit: Payer: Medicare Other

## 2012-04-23 ENCOUNTER — Other Ambulatory Visit: Payer: Medicare Other

## 2012-04-25 ENCOUNTER — Ambulatory Visit
Admission: RE | Admit: 2012-04-25 | Discharge: 2012-04-25 | Disposition: A | Discharge status: Home / Self Care | Payer: Medicare Other | Source: Ambulatory Visit | Attending: Internal Medicine | Admitting: Internal Medicine

## 2012-04-30 ENCOUNTER — Other Ambulatory Visit (HOSPITAL_COMMUNITY): Payer: Self-pay | Admitting: Internal Medicine

## 2012-05-08 ENCOUNTER — Encounter (HOSPITAL_COMMUNITY): Payer: Medicare Other

## 2012-05-12 DIAGNOSIS — M810 Age-related osteoporosis without current pathological fracture: Secondary | ICD-10-CM | POA: Insufficient documentation

## 2012-05-14 ENCOUNTER — Ambulatory Visit (HOSPITAL_COMMUNITY)
Admission: RE | Admit: 2012-05-14 | Discharge: 2012-05-14 | Disposition: A | Discharge status: Home / Self Care | Payer: Medicare Other | Source: Ambulatory Visit | Attending: Internal Medicine | Admitting: Internal Medicine

## 2012-05-14 DIAGNOSIS — R0602 Shortness of breath: Secondary | ICD-10-CM | POA: Insufficient documentation

## 2012-05-14 MED ORDER — ALBUTEROL SULFATE (5 MG/ML) 0.5% IN NEBU
2.5000 mg | INHALATION_SOLUTION | Freq: Once | RESPIRATORY_TRACT | Status: AC
  Administered 2012-05-14: 2.5 mg via RESPIRATORY_TRACT

## 2012-06-07 DIAGNOSIS — D649 Anemia, unspecified: Secondary | ICD-10-CM | POA: Diagnosis not present

## 2012-06-07 DIAGNOSIS — N390 Urinary tract infection, site not specified: Secondary | ICD-10-CM | POA: Diagnosis not present

## 2012-06-07 DIAGNOSIS — E213 Hyperparathyroidism, unspecified: Secondary | ICD-10-CM | POA: Diagnosis not present

## 2012-06-07 DIAGNOSIS — I10 Essential (primary) hypertension: Secondary | ICD-10-CM | POA: Diagnosis not present

## 2012-06-11 ENCOUNTER — Encounter: Payer: Self-pay | Admitting: Internal Medicine

## 2012-06-11 DIAGNOSIS — N390 Urinary tract infection, site not specified: Secondary | ICD-10-CM | POA: Diagnosis not present

## 2012-06-12 ENCOUNTER — Encounter: Payer: Self-pay | Admitting: Internal Medicine

## 2012-06-12 ENCOUNTER — Ambulatory Visit (INDEPENDENT_AMBULATORY_CARE_PROVIDER_SITE_OTHER): Payer: Medicare Other | Admitting: Internal Medicine

## 2012-06-12 ENCOUNTER — Encounter: Payer: Self-pay | Admitting: Geriatric Medicine

## 2012-06-12 VITALS — BP 112/73 | HR 81 | Temp 97.8°F | Resp 18 | Ht 64.0 in | Wt 199.0 lb

## 2012-06-12 DIAGNOSIS — R159 Full incontinence of feces: Secondary | ICD-10-CM

## 2012-06-12 DIAGNOSIS — E669 Obesity, unspecified: Secondary | ICD-10-CM

## 2012-06-12 DIAGNOSIS — E119 Type 2 diabetes mellitus without complications: Secondary | ICD-10-CM

## 2012-06-12 DIAGNOSIS — I10 Essential (primary) hypertension: Secondary | ICD-10-CM | POA: Diagnosis not present

## 2012-06-12 DIAGNOSIS — R0602 Shortness of breath: Secondary | ICD-10-CM

## 2012-06-12 DIAGNOSIS — M542 Cervicalgia: Secondary | ICD-10-CM | POA: Diagnosis not present

## 2012-06-12 DIAGNOSIS — R109 Unspecified abdominal pain: Secondary | ICD-10-CM

## 2012-06-12 MED ORDER — FLUTICASONE-SALMETEROL 100-50 MCG/DOSE IN AEPB: INHALATION_SPRAY | RESPIRATORY_TRACT | Status: DC

## 2012-06-12 NOTE — Patient Instructions (Signed)
Start the new inhaler.

## 2012-06-12 NOTE — Progress Notes (Signed)
Subjective:     Patient ID: Christine Pierce, female   DOB: 15-Oct-1921, 79 y.o.   MRN: [id number]  HPI Saw Dr. Darrick Pierce last Fri for 6 mo checkup. She was wheezing. He recommended Zyrtec. She gave him a urine specimen for culture.  Saw Dr. Simonne Pierce for pain in the left hip at the greater trochanter. He Gave her 5mg  Prednisone Dose Pak. She has not used it.  Patient Active Problem List  Diagnosis  . Cervicalgia: not doing any exeercises  . Obesity, unspecified: Not following any specific diet   . Unspecified essential hypertension: Using medications appropriately; symptoms controlled   . Type II or unspecified type diabetes mellitus without mention of complication, not stated as uncontrolled: Not following a diet   . Abdominal  pain, other specified site  . Full incontinence of feces  . Shortness of breath: Had PFT to further evaluate persistent complaints of dyspnea. Showed diffusion only 49%. Lung volumes normal. Moderate airflow limitation that improved with bronchodilator.  Philomena Doheny CT chest: Wants to reschedule CT chest scan in Aug 2013.Pain in the left side at the lower rib margin is still present. Prior scan showed a questionable lesion in the left lower lobe.     Review of Systems  Constitutional: Positive for fatigue. Negative for fever, chills, diaphoresis, activity change, appetite change and unexpected weight change.  HENT: Positive for hearing loss.   Eyes: Negative.   Respiratory: Positive for cough and shortness of breath.   Cardiovascular: Negative.   Gastrointestinal:       Occasional fecal incontinence.  Endocrine:       Elevated blood sugars. Currently on "dietary control". She is not following her diet closely.  Genitourinary: Positive for urgency and frequency. Negative for dysuria, decreased urine volume, vaginal bleeding, vaginal discharge, enuresis, genital sores, vaginal pain and pelvic pain.  Musculoskeletal:       Generalized weakness  Allergic/Immunologic:  Negative.   Hematological: Negative.   Psychiatric/Behavioral: Negative.        Objective:   Physical Exam  Constitutional: She is oriented to person, place, and time. No distress.  Overweight  HENT:  Head: Atraumatic.  Partial deafness. Uses hearing aids.  Eyes:  Wears prescription lenses.  Neck: No JVD present. No tracheal deviation present. No thyromegaly present.  Some tenderness with movement laterally to either side.  Cardiovascular: Normal rate, regular rhythm, normal heart sounds and intact distal pulses.  Exam reveals no friction rub.   No murmur heard. Pulmonary/Chest: No respiratory distress. She has no wheezes. She has no rales. She exhibits tenderness.  Tender left flank area lower rib margins laterally and slightly anteriorly.  Abdominal: Soft. Bowel sounds are normal. She exhibits no distension. There is no tenderness. There is no rebound and no guarding.  Musculoskeletal: Normal range of motion. She exhibits no edema and no tenderness.  Lymphadenopathy:    She has no cervical adenopathy.  Neurological: She is alert and oriented to person, place, and time. No cranial nerve deficit. She exhibits normal muscle tone. Coordination normal.  Skin: No rash noted. She is not diaphoretic. No erythema. No pallor.  Psychiatric: She has a normal mood and affect. Her behavior is normal. Judgment and thought content normal.       Assessment:     786.05 dyspnea: add Advair. Because of abnormal oxygen diffusion studies, I recommended that she not go to fail planned trip. I feel the risk of having respiratory decompensation is too high. 723.1 neck pain: Demonstrated stretching exercises  278.00 obesity: Again discussed the importance of losing weight 401.9 hypertension: Under good control 250.00 diabetes mellitus: Under satisfactory control 789.09-like discomfort: Etiology still undetermined 793.19 abnormal CT scan of the chest: Schedule followup CT chest August 2014.    Plan:      Add Advair. Schedule CT chest in Aug 2014.

## 2012-06-13 ENCOUNTER — Telehealth: Payer: Self-pay | Admitting: *Deleted

## 2012-06-13 NOTE — Telephone Encounter (Signed)
error 

## 2012-06-17 DIAGNOSIS — I10 Essential (primary) hypertension: Secondary | ICD-10-CM | POA: Insufficient documentation

## 2012-06-17 DIAGNOSIS — E669 Obesity, unspecified: Secondary | ICD-10-CM | POA: Insufficient documentation

## 2012-06-17 DIAGNOSIS — R159 Full incontinence of feces: Secondary | ICD-10-CM | POA: Insufficient documentation

## 2012-06-17 DIAGNOSIS — M542 Cervicalgia: Secondary | ICD-10-CM | POA: Insufficient documentation

## 2012-06-17 DIAGNOSIS — R1032 Left lower quadrant pain: Secondary | ICD-10-CM | POA: Insufficient documentation

## 2012-06-17 DIAGNOSIS — R0602 Shortness of breath: Secondary | ICD-10-CM | POA: Insufficient documentation

## 2012-06-19 DIAGNOSIS — Z1231 Encounter for screening mammogram for malignant neoplasm of breast: Secondary | ICD-10-CM | POA: Diagnosis not present

## 2012-07-01 DIAGNOSIS — M79609 Pain in unspecified limb: Secondary | ICD-10-CM | POA: Diagnosis not present

## 2012-07-01 DIAGNOSIS — B351 Tinea unguium: Secondary | ICD-10-CM | POA: Diagnosis not present

## 2012-07-02 ENCOUNTER — Encounter: Payer: Self-pay | Admitting: Internal Medicine

## 2012-08-02 DIAGNOSIS — N390 Urinary tract infection, site not specified: Secondary | ICD-10-CM | POA: Diagnosis not present

## 2012-08-07 ENCOUNTER — Ambulatory Visit: Payer: Self-pay | Admitting: Internal Medicine

## 2012-08-07 ENCOUNTER — Encounter: Payer: Self-pay | Admitting: Geriatric Medicine

## 2012-08-30 ENCOUNTER — Encounter: Payer: Self-pay | Admitting: *Deleted

## 2012-09-04 ENCOUNTER — Ambulatory Visit (INDEPENDENT_AMBULATORY_CARE_PROVIDER_SITE_OTHER): Payer: Medicare Other | Admitting: Internal Medicine

## 2012-09-04 ENCOUNTER — Encounter: Payer: Self-pay | Admitting: Internal Medicine

## 2012-09-04 VITALS — BP 152/80 | HR 87 | Temp 97.9°F | Resp 18 | Ht 64.0 in | Wt 202.2 lb

## 2012-09-04 DIAGNOSIS — N390 Urinary tract infection, site not specified: Secondary | ICD-10-CM

## 2012-09-04 DIAGNOSIS — E119 Type 2 diabetes mellitus without complications: Secondary | ICD-10-CM | POA: Diagnosis not present

## 2012-09-04 DIAGNOSIS — R5381 Other malaise: Secondary | ICD-10-CM | POA: Diagnosis not present

## 2012-09-04 DIAGNOSIS — R918 Other nonspecific abnormal finding of lung field: Secondary | ICD-10-CM

## 2012-09-04 DIAGNOSIS — E669 Obesity, unspecified: Secondary | ICD-10-CM

## 2012-09-04 DIAGNOSIS — R0602 Shortness of breath: Secondary | ICD-10-CM

## 2012-09-04 DIAGNOSIS — M542 Cervicalgia: Secondary | ICD-10-CM | POA: Diagnosis not present

## 2012-09-04 DIAGNOSIS — R5383 Other fatigue: Secondary | ICD-10-CM

## 2012-09-04 DIAGNOSIS — I10 Essential (primary) hypertension: Secondary | ICD-10-CM

## 2012-09-04 HISTORY — DX: Urinary tract infection, site not specified: N39.0

## 2012-09-04 HISTORY — DX: Other nonspecific abnormal finding of lung field: R91.8

## 2012-09-04 HISTORY — DX: Other fatigue: R53.83

## 2012-09-04 NOTE — Progress Notes (Signed)
Subjective:    Patient ID: Christine Pierce, female    DOB: 06-Oct-1921, 79 y.o.   MRN: [id number]  HPI Other malaise and fatigue : This continues to be the major complaint infection. Etiology has not been established.  Obesity, unspecified: Unchanged. She asked for a caloric reduction diet and asked for advice about a Mediterranean diet.  Type II or unspecified type diabetes mellitus without mention of complication, not stated as uncontrolled: Controlled  Hypertension: Controlled  Shortness of breath: This may be a little bit better since being on Advair. She is not sure. She would like to continue to stay on this medication for a while longer.  Abnormal CT scan of lung: Abnormal linear findings in the left lung. Followup CT scan was suggested by radiologist in February 2014.  Urinary tract infection, site not specified: Recent urinary tract infection in April 2014 treated by Dr. Joycelyn Rua. Followup urine specimen was done about 2 weeks ago. She has not heard about the results yet.  Patient has had a somewhat troublesome pain in the right temple recently.  She complains of a change in her voice. She describes it as hoarseness.     Current Outpatient Prescriptions on File Prior to Visit  Medication Sig Dispense Refill  . acetaminophen (TYLENOL) 500 MG tablet Take 500 mg by mouth every 6 (six) hours as needed for pain. Take 1 tablet as needed for pain.      Marland Kitchen aspirin EC 81 MG tablet Take 81 mg by mouth daily. Take 1 tablet daily to prevent heart attack, stroke and blood clot.      . cetirizine (ZYRTEC) 5 MG tablet Take 5 mg by mouth daily. For allergies      . Fluticasone-Salmeterol (ADVAIR) 100-50 MCG/DOSE AEPB Inhale once every 12 hours to help breathing  1 each  3  . losartan-hydrochlorothiazide (HYZAAR) 50-12.5 MG per tablet Take 1 tablet by mouth daily. Take one tablet daily for blood pressure.      . lovastatin (MEVACOR) 40 MG tablet Take 40 mg by mouth daily. Take 1 tablet daily for  blood pressure.      . Multiple Vitamins-Minerals (MULTIVITAMIN WITH MINERALS) tablet Take 1 tablet by mouth daily. Take 1 tablet daily for vitamin supplement.      . nitroGLYCERIN (NITROSTAT) 0.4 MG SL tablet Place 0.4 mg under the tongue every 5 (five) minutes as needed for chest pain.      Marland Kitchen triamcinolone cream (KENALOG) 0.1 % Apply 1 application topically 2 (two) times daily. Apply daily to crusty lesions.         Review of Systems  Constitutional: Positive for fatigue. Negative for fever, chills, diaphoresis, activity change, appetite change and unexpected weight change.  HENT: Positive for hearing loss.   Eyes: Negative.   Respiratory: Positive for cough and shortness of breath.        Complains of a hoarse voice for the last few months. There is no pain.  Cardiovascular: Negative.   Gastrointestinal:       Occasional fecal incontinence.  Endocrine:       Elevated blood sugars. Currently on "dietary control". She is not following her diet closely.  Genitourinary: Positive for urgency and frequency. Negative for dysuria, decreased urine volume, vaginal bleeding, vaginal discharge, enuresis, genital sores, vaginal pain and pelvic pain.  Musculoskeletal:       Generalized weakness  Allergic/Immunologic: Negative.   Hematological: Negative.   Psychiatric/Behavioral: Negative.        Objective:BP 152/80  Pulse  87  Temp(Src) 97.9 F (36.6 C) (Oral)  Resp 18  Ht 5\' 4"  (1.626 m)  Wt 202 lb 3.2 oz (91.717 kg)  BMI 34.69 kg/m2  SpO2 96%    Physical Exam  Constitutional: She is oriented to person, place, and time. No distress.  Overweight  HENT:  Head: Atraumatic.  Partial deafness. Uses hearing aids.  Eyes:  Wears prescription lenses.  Neck: No JVD present. No tracheal deviation present. No thyromegaly present.  Some tenderness with movement laterally to either side.  Cardiovascular: Normal rate, regular rhythm, normal heart sounds and intact distal pulses.  Exam reveals no  friction rub.   No murmur heard. Pulmonary/Chest: No respiratory distress. She has no wheezes. She has no rales. She exhibits tenderness.  Tender left flank area lower rib margins laterally and slightly anteriorly. Voice is slightly hoarse.  Abdominal: Soft. Bowel sounds are normal. She exhibits no distension. There is no tenderness. There is no rebound and no guarding.  Musculoskeletal: Normal range of motion. She exhibits no edema and no tenderness.  Lymphadenopathy:    She has no cervical adenopathy.  Neurological: She is alert and oriented to person, place, and time. No cranial nerve deficit. She exhibits normal muscle tone. Coordination normal.  Skin: No rash noted. She is not diaphoretic. No erythema. No pallor.  Psychiatric: She has a normal mood and affect. Her behavior is normal. Judgment and thought content normal.    Radiology results 2642 CT scan of chest: Irregular linear opacity within the superior segment of the left lower lobe. This may represent scarring, but due to the irregular contours, followup CT is recommended in 4-6 months to evaluate stability.    Assessment & Plan:  Other malaise and fatigue: Not able to establish an etiology for this. It could have something to do with her shortness of breath.   - Plan: CBC With differential/Platelet, TSH  Cervicalgia: Chronic neck pains  Obesity, unspecified: Patient was given a 1000-calorie diet plan. We printed a [facility] article about the Mediterranean diet. I advised her that she could combine the 2.  Type II or unspecified type diabetes mellitus without mention of complication, not stated as uncontrolled   - Plan: Comprehensive metabolic panel, Hemoglobin A1c  Hypertension: Controlled  Shortness of breath: Possible improvement. Continue Advair.  Abnormal CT scan of lung: Further followup is warranted   - Plan: CT Chest Wo Contrast  Urinary tract infection, site not specified:   - Plan: Urinalysis with Reflex  Microscopic prior to her next office visit

## 2012-09-04 NOTE — Patient Instructions (Addendum)
continue current medication. Use diet as explained.

## 2012-09-10 ENCOUNTER — Ambulatory Visit
Admission: RE | Admit: 2012-09-10 | Discharge: 2012-09-10 | Disposition: A | Discharge status: Home / Self Care | Payer: Medicare Other | Source: Ambulatory Visit | Attending: Internal Medicine | Admitting: Internal Medicine

## 2012-09-10 DIAGNOSIS — J984 Other disorders of lung: Secondary | ICD-10-CM | POA: Diagnosis not present

## 2012-09-10 DIAGNOSIS — R918 Other nonspecific abnormal finding of lung field: Secondary | ICD-10-CM

## 2012-09-12 NOTE — Progress Notes (Signed)
Patient aware.

## 2012-09-19 DIAGNOSIS — H3530 Unspecified macular degeneration: Secondary | ICD-10-CM | POA: Diagnosis not present

## 2012-09-19 DIAGNOSIS — H2640 Unspecified secondary cataract: Secondary | ICD-10-CM | POA: Diagnosis not present

## 2012-09-19 DIAGNOSIS — H43819 Vitreous degeneration, unspecified eye: Secondary | ICD-10-CM | POA: Diagnosis not present

## 2012-09-24 DIAGNOSIS — H26499 Other secondary cataract, unspecified eye: Secondary | ICD-10-CM | POA: Diagnosis not present

## 2012-09-24 DIAGNOSIS — H2640 Unspecified secondary cataract: Secondary | ICD-10-CM | POA: Diagnosis not present

## 2012-09-30 DIAGNOSIS — M79609 Pain in unspecified limb: Secondary | ICD-10-CM | POA: Diagnosis not present

## 2012-09-30 DIAGNOSIS — B351 Tinea unguium: Secondary | ICD-10-CM | POA: Diagnosis not present

## 2012-10-22 ENCOUNTER — Encounter: Payer: Self-pay | Admitting: Internal Medicine

## 2012-10-22 ENCOUNTER — Ambulatory Visit (INDEPENDENT_AMBULATORY_CARE_PROVIDER_SITE_OTHER): Payer: Medicare Other | Admitting: Internal Medicine

## 2012-10-22 ENCOUNTER — Telehealth: Payer: Self-pay | Admitting: *Deleted

## 2012-10-22 VITALS — BP 140/100 | HR 70 | Temp 98.0°F | Resp 18

## 2012-10-22 DIAGNOSIS — M25559 Pain in unspecified hip: Secondary | ICD-10-CM | POA: Diagnosis not present

## 2012-10-22 DIAGNOSIS — M25551 Pain in right hip: Secondary | ICD-10-CM

## 2012-10-22 HISTORY — DX: Pain in unspecified hip: M25.559

## 2012-10-22 MED ORDER — TRAMADOL HCL 50 MG PO TABS: ORAL_TABLET | ORAL | Status: DC

## 2012-10-22 NOTE — Telephone Encounter (Signed)
Can you prescribe something for Urinary Urgency? Patient states it is a problem. And also should she invest in a walker? Please advise.

## 2012-10-22 NOTE — Progress Notes (Signed)
Subjective:    Patient ID: Christine Pierce, female    DOB: [birth year], 79 y.o.   MRN: [id number]  HPI Right hip pain for 3 days. No falls or unusual movement. Has sciatic pain down the back of the leg. Leg seems weak. Using a cane. Worst pain is in the right ischial area. She had a normal x-ray of this area 04/11/2012.  Her orthopedist is Dr. Jene Every.  Current Outpatient Prescriptions on File Prior to Visit  Medication Sig Dispense Refill  . acetaminophen (TYLENOL) 500 MG tablet Take 500 mg by mouth every 6 (six) hours as needed for pain. Take 1 tablet as needed for pain.      Marland Kitchen aspirin EC 81 MG tablet Take 81 mg by mouth daily. Take 1 tablet daily to prevent heart attack, stroke and blood clot.      . cetirizine (ZYRTEC) 5 MG tablet Take 5 mg by mouth daily. For allergies      . Fluticasone-Salmeterol (ADVAIR) 100-50 MCG/DOSE AEPB Inhale once every 12 hours to help breathing  1 each  3  . losartan-hydrochlorothiazide (HYZAAR) 50-12.5 MG per tablet Take 1 tablet by mouth daily. Take one tablet daily for blood pressure.      . Multiple Vitamins-Minerals (MULTIVITAMIN WITH MINERALS) tablet Take 1 tablet by mouth daily. Take 1 tablet daily for vitamin supplement.      . nitroGLYCERIN (NITROSTAT) 0.4 MG SL tablet Place 0.4 mg under the tongue every 5 (five) minutes as needed for chest pain.      Marland Kitchen triamcinolone cream (KENALOG) 0.1 % Apply 1 application topically 2 (two) times daily. Apply daily to crusty lesions.       No current facility-administered medications on file prior to visit.    Review of Systems  Constitutional: Positive for fatigue. Negative for fever, chills, diaphoresis, activity change, appetite change and unexpected weight change.  HENT: Positive for hearing loss.   Eyes: Negative.   Respiratory: Positive for cough and shortness of breath.        Complains of a hoarse voice for the last few months. There is no pain.  Cardiovascular: Negative.   Gastrointestinal:   Occasional fecal incontinence.  Endocrine:       Elevated blood sugars. Currently on "dietary control". She is not following her diet closely.  Genitourinary: Positive for urgency and frequency. Negative for dysuria, decreased urine volume, vaginal bleeding, vaginal discharge, enuresis, genital sores, vaginal pain and pelvic pain.  Musculoskeletal:       Generalized weakness: Pain in the right hip area which has become quite intense.  Allergic/Immunologic: Negative.   Hematological: Negative.   Psychiatric/Behavioral: Negative.        Objective:BP 140/100  Pulse 70  Temp(Src) 98 F (36.7 C) (Oral)  Resp 18  SpO2 99%    Physical Exam  Constitutional: She is oriented to person, place, and time. No distress.  Overweight  HENT:  Head: Atraumatic.  Partial deafness. Uses hearing aids.  Eyes:  Wears prescription lenses.  Neck: No JVD present. No tracheal deviation present. No thyromegaly present.  Some tenderness with movement laterally to either side.  Cardiovascular: Normal rate, regular rhythm, normal heart sounds and intact distal pulses.  Exam reveals no friction rub.   No murmur heard. Pulmonary/Chest: No respiratory distress. She has no wheezes. She has no rales. She exhibits tenderness.  Tender left flank area lower rib margins laterally and slightly anteriorly. Voice is slightly hoarse.  Abdominal: Soft. Bowel sounds are normal. She exhibits no  distension. There is no tenderness. There is no rebound and no guarding.  Musculoskeletal: Normal range of motion. She exhibits no edema and no tenderness.  Tender the greater trochanter and right groin. She is having great difficulty moving through the hip area when she tries to walk. She is hobbled by this decrease in her stride length.  Lymphadenopathy:    She has no cervical adenopathy.  Neurological: She is alert and oriented to person, place, and time. No cranial nerve deficit. She exhibits normal muscle tone. Coordination  normal.  Skin: No rash noted. She is not diaphoretic. No erythema. No pallor.  Psychiatric: She has a normal mood and affect. Her behavior is normal. Judgment and thought content normal.       Assessment & Plan:  Pain in hip, right: Acute problem that seems to be getting worse over the last several days   - Plan: MR Hip Right Wo Contrast, MR Pelvis Wo Contrast, traMADol (ULTRAM) 50 MG tablet

## 2012-10-22 NOTE — Patient Instructions (Signed)
Continue current medications. I will call Dr Deterding.

## 2012-10-24 ENCOUNTER — Telehealth: Payer: Self-pay | Admitting: Geriatric Medicine

## 2012-10-24 DIAGNOSIS — M25551 Pain in right hip: Secondary | ICD-10-CM

## 2012-10-24 DIAGNOSIS — R531 Weakness: Secondary | ICD-10-CM

## 2012-10-24 DIAGNOSIS — M79609 Pain in unspecified limb: Secondary | ICD-10-CM

## 2012-10-24 MED ORDER — SOLIFENACIN SUCCINATE 5 MG PO TABS: 5.0000 mg | ORAL_TABLET | Freq: Every day | ORAL | Status: DC

## 2012-10-24 NOTE — Telephone Encounter (Signed)
Left message for patient to call me back. 

## 2012-10-24 NOTE — Telephone Encounter (Signed)
I think a walker would be helpful to her she moves around.

## 2012-10-24 NOTE — Telephone Encounter (Signed)
Please call patient. I was able to reach Dr. Darrick Penna today. He says filling the prescription for tramadol for her pain is fine. Also you can send a prescription for VESIcare 5 mg tablets, dispense 30, take one tablet daily for urinary frequency.

## 2012-10-24 NOTE — Telephone Encounter (Signed)
Patient wants to know if you will prescribe something for urinary frequency. Please advise, thanks.

## 2012-10-24 NOTE — Telephone Encounter (Signed)
[facility] called and said that they are going to have to cancel the appointment on 10/25/12 for an MRI of the right hip and pelvis unless we provide them with information about a stapedectomy that the patient had in the 80's. I spoke with patient. It was done in [place]. I don't see anything in our records about that procedure. The patient is calling [facility] to see if she can provide them with the necessary information.

## 2012-10-25 NOTE — Telephone Encounter (Signed)
Please put in orders

## 2012-10-25 NOTE — Telephone Encounter (Signed)
Called [facility] and scheduled an appointment for CT hip and pelvis

## 2012-10-25 NOTE — Telephone Encounter (Signed)
Christine Pierce called and said that both her procedures have been cancelled due to the fact that they do not have the information about her stapedectomy. It was done in the 50's in [place]. She wants to know if there are any other tests or procedures that can be done to help figure out a way to get her more mobile. She says she will take an injection if need be. Please advise, thanks.

## 2012-10-25 NOTE — Addendum Note (Signed)
Addended by: Melina Modena F on: 10/25/2012 04:26 PM   Modules accepted: Orders

## 2012-10-25 NOTE — Telephone Encounter (Signed)
Set up an appointment at [facility]) Appt:  10/29/2012 @ 1:45 LMOM for patient to return call.

## 2012-10-25 NOTE — Telephone Encounter (Signed)
See if we can get  CT scan of the hip and pelvis

## 2012-10-26 ENCOUNTER — Telehealth: Payer: Self-pay | Admitting: Internal Medicine

## 2012-10-26 NOTE — Telephone Encounter (Signed)
Patient called to report lost script for tramadol 50mg  q4h. Called into [facility]

## 2012-10-27 ENCOUNTER — Other Ambulatory Visit: Payer: Medicare Other

## 2012-10-27 ENCOUNTER — Inpatient Hospital Stay
Admission: RE | Admit: 2012-10-27 | Discharge: 2012-10-27 | Disposition: A | Discharge status: Home / Self Care | Payer: Medicare Other | Source: Ambulatory Visit | Attending: Internal Medicine | Admitting: Internal Medicine

## 2012-10-29 ENCOUNTER — Ambulatory Visit
Admission: RE | Admit: 2012-10-29 | Discharge: 2012-10-29 | Disposition: A | Discharge status: Home / Self Care | Payer: Medicare Other | Source: Ambulatory Visit | Attending: Internal Medicine | Admitting: Internal Medicine

## 2012-10-29 ENCOUNTER — Other Ambulatory Visit: Payer: Self-pay | Admitting: *Deleted

## 2012-10-29 DIAGNOSIS — M79609 Pain in unspecified limb: Secondary | ICD-10-CM

## 2012-10-29 DIAGNOSIS — M25559 Pain in unspecified hip: Secondary | ICD-10-CM | POA: Diagnosis not present

## 2012-10-29 DIAGNOSIS — R531 Weakness: Secondary | ICD-10-CM

## 2012-10-29 DIAGNOSIS — R52 Pain, unspecified: Secondary | ICD-10-CM

## 2012-10-29 DIAGNOSIS — M1710 Unilateral primary osteoarthritis, unspecified knee: Secondary | ICD-10-CM | POA: Diagnosis not present

## 2012-10-29 DIAGNOSIS — M25551 Pain in right hip: Secondary | ICD-10-CM

## 2012-10-29 NOTE — Telephone Encounter (Signed)
Patient Notified and got a walker

## 2012-10-31 ENCOUNTER — Telehealth: Payer: Self-pay | Admitting: *Deleted

## 2012-10-31 DIAGNOSIS — M25559 Pain in unspecified hip: Secondary | ICD-10-CM

## 2012-10-31 NOTE — Telephone Encounter (Signed)
Spoke with patient and she stated that she didn't want to call 911 for her chest pains because she will die before they ever get there. She stated that she took a nap and when she got back up she felt better and she was fine. Told patient with her symptoms she should go ahead and call them and she stated no.  Told her about her CT of the Pelvis and that Dr. Chilton Si wanted her to have an Orthopedic appointment set up to be evaluated for her pain and she said she uses [facility] Dr. Jene Every. I called their office and appointment was made for:  [facility]--Dr. Jene Every             #: [phone]                        Appt:  11/01/2012 @ 10:15am Patient Notified and she stated she was going to call someone to take her.

## 2012-10-31 NOTE — Telephone Encounter (Signed)
Orthopedic appointment order placed

## 2012-10-31 NOTE — Addendum Note (Signed)
Addended by: Melina Modena F on: 10/31/2012 02:47 PM   Modules accepted: Orders

## 2012-10-31 NOTE — Telephone Encounter (Signed)
Have tried calling patient for the past 2 days and it rings busy. Patient called this morning and stated that she has been having chest pains since last night with left arm pain and if I call back to leave a message if she wasn't available. Tried calling patient and got the voicemail. Left patient a message that she needed to go to the Urgent Care if she was having chest pains with arm pain. Also told her i needed her to call me back to make sure she got my message and if she was ok. Also told her that Dr. Chilton Si wanted to refer her to an Orthopedic Dr. And wanted to know if she has ever been to one. Waiting for patient's call.Marland KitchenMarland Kitchen

## 2012-11-01 DIAGNOSIS — M48061 Spinal stenosis, lumbar region without neurogenic claudication: Secondary | ICD-10-CM | POA: Diagnosis not present

## 2012-11-06 ENCOUNTER — Other Ambulatory Visit: Payer: Self-pay | Admitting: Specialist

## 2012-11-06 DIAGNOSIS — M48061 Spinal stenosis, lumbar region without neurogenic claudication: Secondary | ICD-10-CM

## 2012-11-08 ENCOUNTER — Telehealth: Payer: Self-pay | Admitting: *Deleted

## 2012-11-08 NOTE — Telephone Encounter (Signed)
Angie with [facility] called and stated that the patient needs an order for PT/OT if we could refer her. Also stated that patient was going to have a back injection next week for back pain. Stated that patient's neighbor called DSS on patient due to patient not eating and Home Nurse stated that this was due because patient could not walk to the refrigerator.   I filled out a face to face for [facility] and had Dr. Renato Gails sign and faxed to them.

## 2012-11-09 DIAGNOSIS — E119 Type 2 diabetes mellitus without complications: Secondary | ICD-10-CM | POA: Diagnosis not present

## 2012-11-09 DIAGNOSIS — Z9181 History of falling: Secondary | ICD-10-CM | POA: Diagnosis not present

## 2012-11-09 DIAGNOSIS — M6281 Muscle weakness (generalized): Secondary | ICD-10-CM | POA: Diagnosis not present

## 2012-11-09 DIAGNOSIS — M542 Cervicalgia: Secondary | ICD-10-CM | POA: Diagnosis not present

## 2012-11-09 DIAGNOSIS — I10 Essential (primary) hypertension: Secondary | ICD-10-CM | POA: Diagnosis not present

## 2012-11-09 DIAGNOSIS — Z87440 Personal history of urinary (tract) infections: Secondary | ICD-10-CM | POA: Diagnosis not present

## 2012-11-09 DIAGNOSIS — L909 Atrophic disorder of skin, unspecified: Secondary | ICD-10-CM | POA: Diagnosis not present

## 2012-11-09 DIAGNOSIS — R262 Difficulty in walking, not elsewhere classified: Secondary | ICD-10-CM

## 2012-11-09 DIAGNOSIS — L919 Hypertrophic disorder of the skin, unspecified: Secondary | ICD-10-CM

## 2012-11-09 DIAGNOSIS — M25559 Pain in unspecified hip: Secondary | ICD-10-CM | POA: Diagnosis not present

## 2012-11-11 DIAGNOSIS — R262 Difficulty in walking, not elsewhere classified: Secondary | ICD-10-CM | POA: Diagnosis not present

## 2012-11-11 DIAGNOSIS — E119 Type 2 diabetes mellitus without complications: Secondary | ICD-10-CM | POA: Diagnosis not present

## 2012-11-11 DIAGNOSIS — M542 Cervicalgia: Secondary | ICD-10-CM | POA: Diagnosis not present

## 2012-11-11 DIAGNOSIS — M6281 Muscle weakness (generalized): Secondary | ICD-10-CM | POA: Diagnosis not present

## 2012-11-11 DIAGNOSIS — M25559 Pain in unspecified hip: Secondary | ICD-10-CM | POA: Diagnosis not present

## 2012-11-11 DIAGNOSIS — I10 Essential (primary) hypertension: Secondary | ICD-10-CM | POA: Diagnosis not present

## 2012-11-12 DIAGNOSIS — E119 Type 2 diabetes mellitus without complications: Secondary | ICD-10-CM | POA: Diagnosis not present

## 2012-11-12 DIAGNOSIS — M542 Cervicalgia: Secondary | ICD-10-CM | POA: Diagnosis not present

## 2012-11-12 DIAGNOSIS — R262 Difficulty in walking, not elsewhere classified: Secondary | ICD-10-CM | POA: Diagnosis not present

## 2012-11-12 DIAGNOSIS — M6281 Muscle weakness (generalized): Secondary | ICD-10-CM | POA: Diagnosis not present

## 2012-11-12 DIAGNOSIS — I10 Essential (primary) hypertension: Secondary | ICD-10-CM | POA: Diagnosis not present

## 2012-11-12 DIAGNOSIS — M25559 Pain in unspecified hip: Secondary | ICD-10-CM | POA: Diagnosis not present

## 2012-11-13 ENCOUNTER — Ambulatory Visit
Admission: RE | Admit: 2012-11-13 | Discharge: 2012-11-13 | Disposition: A | Discharge status: Home / Self Care | Payer: Medicare Other | Source: Ambulatory Visit | Attending: Specialist | Admitting: Specialist

## 2012-11-13 VITALS — BP 174/93 | HR 77

## 2012-11-13 DIAGNOSIS — M47817 Spondylosis without myelopathy or radiculopathy, lumbosacral region: Secondary | ICD-10-CM | POA: Diagnosis not present

## 2012-11-13 DIAGNOSIS — M48061 Spinal stenosis, lumbar region without neurogenic claudication: Secondary | ICD-10-CM

## 2012-11-13 DIAGNOSIS — M545 Low back pain: Secondary | ICD-10-CM | POA: Diagnosis not present

## 2012-11-13 MED ORDER — IOHEXOL 180 MG/ML  SOLN
1.0000 mL | Freq: Once | INTRAMUSCULAR | Status: AC | PRN
  Administered 2012-11-13: 1 mL via EPIDURAL

## 2012-11-13 MED ORDER — METHYLPREDNISOLONE ACETATE 40 MG/ML INJ SUSP (RADIOLOG
120.0000 mg | Freq: Once | INTRAMUSCULAR | Status: AC
  Administered 2012-11-13: 120 mg via EPIDURAL

## 2012-11-14 ENCOUNTER — Other Ambulatory Visit: Payer: Self-pay | Admitting: Specialist

## 2012-11-14 DIAGNOSIS — I10 Essential (primary) hypertension: Secondary | ICD-10-CM | POA: Diagnosis not present

## 2012-11-14 DIAGNOSIS — E119 Type 2 diabetes mellitus without complications: Secondary | ICD-10-CM | POA: Diagnosis not present

## 2012-11-14 DIAGNOSIS — R262 Difficulty in walking, not elsewhere classified: Secondary | ICD-10-CM | POA: Diagnosis not present

## 2012-11-14 DIAGNOSIS — M25559 Pain in unspecified hip: Secondary | ICD-10-CM | POA: Diagnosis not present

## 2012-11-14 DIAGNOSIS — M6281 Muscle weakness (generalized): Secondary | ICD-10-CM | POA: Diagnosis not present

## 2012-11-14 DIAGNOSIS — M4800 Spinal stenosis, site unspecified: Secondary | ICD-10-CM

## 2012-11-14 DIAGNOSIS — M542 Cervicalgia: Secondary | ICD-10-CM | POA: Diagnosis not present

## 2012-11-15 ENCOUNTER — Telehealth: Payer: Self-pay

## 2012-11-15 DIAGNOSIS — M6281 Muscle weakness (generalized): Secondary | ICD-10-CM | POA: Diagnosis not present

## 2012-11-15 DIAGNOSIS — I10 Essential (primary) hypertension: Secondary | ICD-10-CM | POA: Diagnosis not present

## 2012-11-15 DIAGNOSIS — R262 Difficulty in walking, not elsewhere classified: Secondary | ICD-10-CM | POA: Diagnosis not present

## 2012-11-15 DIAGNOSIS — M542 Cervicalgia: Secondary | ICD-10-CM | POA: Diagnosis not present

## 2012-11-15 DIAGNOSIS — E119 Type 2 diabetes mellitus without complications: Secondary | ICD-10-CM | POA: Diagnosis not present

## 2012-11-15 DIAGNOSIS — M25559 Pain in unspecified hip: Secondary | ICD-10-CM | POA: Diagnosis not present

## 2012-11-15 MED ORDER — ROLLER WALKER MISC: Status: DC

## 2012-11-15 NOTE — Telephone Encounter (Signed)
Message left on triage voicemail: Please fax order for rolling walker to [phone]

## 2012-11-18 ENCOUNTER — Other Ambulatory Visit: Payer: Self-pay | Admitting: *Deleted

## 2012-11-18 DIAGNOSIS — M6281 Muscle weakness (generalized): Secondary | ICD-10-CM | POA: Diagnosis not present

## 2012-11-18 DIAGNOSIS — R262 Difficulty in walking, not elsewhere classified: Secondary | ICD-10-CM | POA: Diagnosis not present

## 2012-11-18 DIAGNOSIS — M542 Cervicalgia: Secondary | ICD-10-CM | POA: Diagnosis not present

## 2012-11-18 DIAGNOSIS — I10 Essential (primary) hypertension: Secondary | ICD-10-CM | POA: Diagnosis not present

## 2012-11-18 DIAGNOSIS — E119 Type 2 diabetes mellitus without complications: Secondary | ICD-10-CM | POA: Diagnosis not present

## 2012-11-18 DIAGNOSIS — M25559 Pain in unspecified hip: Secondary | ICD-10-CM | POA: Diagnosis not present

## 2012-11-18 MED ORDER — ROLLER WALKER MISC: Status: DC

## 2012-11-19 DIAGNOSIS — M25559 Pain in unspecified hip: Secondary | ICD-10-CM | POA: Diagnosis not present

## 2012-11-19 DIAGNOSIS — M6281 Muscle weakness (generalized): Secondary | ICD-10-CM | POA: Diagnosis not present

## 2012-11-19 DIAGNOSIS — I10 Essential (primary) hypertension: Secondary | ICD-10-CM | POA: Diagnosis not present

## 2012-11-19 DIAGNOSIS — R262 Difficulty in walking, not elsewhere classified: Secondary | ICD-10-CM | POA: Diagnosis not present

## 2012-11-19 DIAGNOSIS — M542 Cervicalgia: Secondary | ICD-10-CM | POA: Diagnosis not present

## 2012-11-19 DIAGNOSIS — E119 Type 2 diabetes mellitus without complications: Secondary | ICD-10-CM | POA: Diagnosis not present

## 2012-11-21 DIAGNOSIS — R262 Difficulty in walking, not elsewhere classified: Secondary | ICD-10-CM | POA: Diagnosis not present

## 2012-11-21 DIAGNOSIS — M542 Cervicalgia: Secondary | ICD-10-CM | POA: Diagnosis not present

## 2012-11-21 DIAGNOSIS — E119 Type 2 diabetes mellitus without complications: Secondary | ICD-10-CM | POA: Diagnosis not present

## 2012-11-21 DIAGNOSIS — M6281 Muscle weakness (generalized): Secondary | ICD-10-CM | POA: Diagnosis not present

## 2012-11-21 DIAGNOSIS — I10 Essential (primary) hypertension: Secondary | ICD-10-CM | POA: Diagnosis not present

## 2012-11-21 DIAGNOSIS — M25559 Pain in unspecified hip: Secondary | ICD-10-CM | POA: Diagnosis not present

## 2012-11-26 ENCOUNTER — Telehealth: Payer: Self-pay

## 2012-11-26 DIAGNOSIS — R262 Difficulty in walking, not elsewhere classified: Secondary | ICD-10-CM | POA: Diagnosis not present

## 2012-11-26 DIAGNOSIS — M542 Cervicalgia: Secondary | ICD-10-CM | POA: Diagnosis not present

## 2012-11-26 DIAGNOSIS — M6281 Muscle weakness (generalized): Secondary | ICD-10-CM | POA: Diagnosis not present

## 2012-11-26 DIAGNOSIS — E119 Type 2 diabetes mellitus without complications: Secondary | ICD-10-CM | POA: Diagnosis not present

## 2012-11-26 DIAGNOSIS — I10 Essential (primary) hypertension: Secondary | ICD-10-CM | POA: Diagnosis not present

## 2012-11-26 DIAGNOSIS — M25559 Pain in unspecified hip: Secondary | ICD-10-CM | POA: Diagnosis not present

## 2012-11-26 NOTE — Telephone Encounter (Signed)
I called Buzz and gave future orders listed in the system. Buzz indicated he will draw labs and fax results to Dr.Green

## 2012-11-29 DIAGNOSIS — M542 Cervicalgia: Secondary | ICD-10-CM | POA: Diagnosis not present

## 2012-11-29 DIAGNOSIS — R262 Difficulty in walking, not elsewhere classified: Secondary | ICD-10-CM | POA: Diagnosis not present

## 2012-11-29 DIAGNOSIS — M6281 Muscle weakness (generalized): Secondary | ICD-10-CM | POA: Diagnosis not present

## 2012-11-29 DIAGNOSIS — I10 Essential (primary) hypertension: Secondary | ICD-10-CM | POA: Diagnosis not present

## 2012-11-29 DIAGNOSIS — M25559 Pain in unspecified hip: Secondary | ICD-10-CM | POA: Diagnosis not present

## 2012-11-29 DIAGNOSIS — E119 Type 2 diabetes mellitus without complications: Secondary | ICD-10-CM | POA: Diagnosis not present

## 2012-12-02 ENCOUNTER — Other Ambulatory Visit: Payer: Medicare Other

## 2012-12-03 DIAGNOSIS — E119 Type 2 diabetes mellitus without complications: Secondary | ICD-10-CM | POA: Diagnosis not present

## 2012-12-03 DIAGNOSIS — R262 Difficulty in walking, not elsewhere classified: Secondary | ICD-10-CM | POA: Diagnosis not present

## 2012-12-03 DIAGNOSIS — I10 Essential (primary) hypertension: Secondary | ICD-10-CM | POA: Diagnosis not present

## 2012-12-03 DIAGNOSIS — M542 Cervicalgia: Secondary | ICD-10-CM | POA: Diagnosis not present

## 2012-12-03 DIAGNOSIS — M6281 Muscle weakness (generalized): Secondary | ICD-10-CM | POA: Diagnosis not present

## 2012-12-03 DIAGNOSIS — M25559 Pain in unspecified hip: Secondary | ICD-10-CM | POA: Diagnosis not present

## 2012-12-04 ENCOUNTER — Ambulatory Visit: Payer: Medicare Other | Admitting: Internal Medicine

## 2012-12-05 DIAGNOSIS — E119 Type 2 diabetes mellitus without complications: Secondary | ICD-10-CM | POA: Diagnosis not present

## 2012-12-05 DIAGNOSIS — I10 Essential (primary) hypertension: Secondary | ICD-10-CM | POA: Diagnosis not present

## 2012-12-05 DIAGNOSIS — M6281 Muscle weakness (generalized): Secondary | ICD-10-CM | POA: Diagnosis not present

## 2012-12-05 DIAGNOSIS — M542 Cervicalgia: Secondary | ICD-10-CM | POA: Diagnosis not present

## 2012-12-05 DIAGNOSIS — M25559 Pain in unspecified hip: Secondary | ICD-10-CM | POA: Diagnosis not present

## 2012-12-05 DIAGNOSIS — R262 Difficulty in walking, not elsewhere classified: Secondary | ICD-10-CM | POA: Diagnosis not present

## 2012-12-06 ENCOUNTER — Ambulatory Visit
Admission: RE | Admit: 2012-12-06 | Discharge: 2012-12-06 | Disposition: A | Discharge status: Home / Self Care | Payer: Medicare Other | Source: Ambulatory Visit | Attending: Specialist | Admitting: Specialist

## 2012-12-06 DIAGNOSIS — M4800 Spinal stenosis, site unspecified: Secondary | ICD-10-CM

## 2012-12-06 DIAGNOSIS — M47817 Spondylosis without myelopathy or radiculopathy, lumbosacral region: Secondary | ICD-10-CM | POA: Diagnosis not present

## 2012-12-06 MED ORDER — METHYLPREDNISOLONE ACETATE 40 MG/ML INJ SUSP (RADIOLOG
120.0000 mg | Freq: Once | INTRAMUSCULAR | Status: AC
  Administered 2012-12-06: 120 mg via EPIDURAL

## 2012-12-06 MED ORDER — IOHEXOL 180 MG/ML  SOLN
1.0000 mL | Freq: Once | INTRAMUSCULAR | Status: AC | PRN
  Administered 2012-12-06: 1 mL via EPIDURAL

## 2012-12-06 NOTE — Progress Notes (Signed)
Pt doing well post injection., pushed her out in w/c to the car.

## 2012-12-10 ENCOUNTER — Other Ambulatory Visit: Payer: Self-pay | Admitting: Internal Medicine

## 2012-12-10 DIAGNOSIS — Z23 Encounter for immunization: Secondary | ICD-10-CM | POA: Diagnosis not present

## 2012-12-11 DIAGNOSIS — R5381 Other malaise: Secondary | ICD-10-CM | POA: Diagnosis not present

## 2012-12-11 DIAGNOSIS — E119 Type 2 diabetes mellitus without complications: Secondary | ICD-10-CM | POA: Diagnosis not present

## 2012-12-11 DIAGNOSIS — R262 Difficulty in walking, not elsewhere classified: Secondary | ICD-10-CM | POA: Diagnosis not present

## 2012-12-11 DIAGNOSIS — M542 Cervicalgia: Secondary | ICD-10-CM | POA: Diagnosis not present

## 2012-12-11 DIAGNOSIS — M6281 Muscle weakness (generalized): Secondary | ICD-10-CM | POA: Diagnosis not present

## 2012-12-11 DIAGNOSIS — I10 Essential (primary) hypertension: Secondary | ICD-10-CM | POA: Diagnosis not present

## 2012-12-11 DIAGNOSIS — M25559 Pain in unspecified hip: Secondary | ICD-10-CM | POA: Diagnosis not present

## 2012-12-11 DIAGNOSIS — N390 Urinary tract infection, site not specified: Secondary | ICD-10-CM | POA: Diagnosis not present

## 2012-12-17 DIAGNOSIS — R269 Unspecified abnormalities of gait and mobility: Secondary | ICD-10-CM

## 2012-12-17 HISTORY — DX: Unspecified abnormalities of gait and mobility: R26.9

## 2012-12-18 DIAGNOSIS — E119 Type 2 diabetes mellitus without complications: Secondary | ICD-10-CM | POA: Diagnosis not present

## 2012-12-18 DIAGNOSIS — M542 Cervicalgia: Secondary | ICD-10-CM | POA: Diagnosis not present

## 2012-12-18 DIAGNOSIS — M25559 Pain in unspecified hip: Secondary | ICD-10-CM | POA: Diagnosis not present

## 2012-12-18 DIAGNOSIS — I10 Essential (primary) hypertension: Secondary | ICD-10-CM | POA: Diagnosis not present

## 2012-12-18 DIAGNOSIS — R262 Difficulty in walking, not elsewhere classified: Secondary | ICD-10-CM | POA: Diagnosis not present

## 2012-12-18 DIAGNOSIS — M6281 Muscle weakness (generalized): Secondary | ICD-10-CM | POA: Diagnosis not present

## 2012-12-19 ENCOUNTER — Ambulatory Visit: Payer: Medicare Other | Admitting: Internal Medicine

## 2012-12-25 ENCOUNTER — Ambulatory Visit
Admission: RE | Admit: 2012-12-25 | Discharge: 2012-12-25 | Disposition: A | Discharge status: Home / Self Care | Payer: Medicare Other | Source: Ambulatory Visit | Attending: Internal Medicine | Admitting: Internal Medicine

## 2012-12-25 ENCOUNTER — Encounter: Payer: Self-pay | Admitting: Internal Medicine

## 2012-12-25 ENCOUNTER — Ambulatory Visit (INDEPENDENT_AMBULATORY_CARE_PROVIDER_SITE_OTHER): Payer: Medicare Other | Admitting: Internal Medicine

## 2012-12-25 VITALS — BP 128/74 | HR 83 | Temp 96.1°F | Resp 14 | Ht 64.5 in | Wt 183.0 lb

## 2012-12-25 DIAGNOSIS — R109 Unspecified abdominal pain: Secondary | ICD-10-CM

## 2012-12-25 DIAGNOSIS — M25551 Pain in right hip: Secondary | ICD-10-CM

## 2012-12-25 DIAGNOSIS — E119 Type 2 diabetes mellitus without complications: Secondary | ICD-10-CM

## 2012-12-25 DIAGNOSIS — M25559 Pain in unspecified hip: Secondary | ICD-10-CM | POA: Diagnosis not present

## 2012-12-25 DIAGNOSIS — R9389 Abnormal findings on diagnostic imaging of other specified body structures: Secondary | ICD-10-CM | POA: Diagnosis not present

## 2012-12-25 DIAGNOSIS — I10 Essential (primary) hypertension: Secondary | ICD-10-CM | POA: Diagnosis not present

## 2012-12-25 DIAGNOSIS — Z111 Encounter for screening for respiratory tuberculosis: Secondary | ICD-10-CM

## 2012-12-25 NOTE — Patient Instructions (Signed)
Continue current medication. Get chest x-ray.

## 2012-12-25 NOTE — Progress Notes (Signed)
Subjective:    Patient ID: Christine Pierce, female    DOB: August 21, 1921, 79 y.o.   MRN: [id number]  Chief Complaint  Patient presents with  . Form Completion    Complete FL2 form  . Orders    Xray for ppd screening   . Immunizations    pneumonia vaccine. ? if ok to get    HPI Headaches. Frontal. Started in last few weeks when she began to look in to [facility]. She is thinking about [facility] on [place]. She has some NH insurance. She cannot wash clothes because she can't get up and down stairs. She cannot do home chores. She can make a simple meal. She is concerned about her balance. She needs help with tub bath. She can toilet herself. She can dress herself. She can manage her own affairs.  Back, right hip, and right groin are better since she had epidural injections.  Having pain in the back of her left calf. Mild. Does not interfere with walking.  Needs CXR to enter AL. Allergic to PPD.  Current Outpatient Prescriptions on File Prior to Visit  Medication Sig Dispense Refill  . aspirin EC 81 MG tablet Take 81 mg by mouth daily. Take 1 tablet daily to prevent heart attack, stroke and blood clot.      . Fluticasone-Salmeterol (ADVAIR) 100-50 MCG/DOSE AEPB Inhale once every 12 hours to help breathing  1 each  3  . losartan-hydrochlorothiazide (HYZAAR) 50-12.5 MG per tablet Take 1 tablet by mouth daily. Take one tablet daily for blood pressure.      . lovastatin (MEVACOR) 40 MG tablet TAKE 1 TABLET DAILY TO LOWER CHOLESTEROL  30 tablet  2  . Misc. Devices (ROLLER WALKER) MISC Rolling Walker with seat and brakes.  1 each  0  . Multiple Vitamins-Minerals (MULTIVITAMIN WITH MINERALS) tablet Take 1 tablet by mouth daily. Take 1 tablet daily for vitamin supplement.      . nitroGLYCERIN (NITROSTAT) 0.4 MG SL tablet Place 0.4 mg under the tongue every 5 (five) minutes as needed for chest pain.      Marland Kitchen solifenacin (VESICARE) 5 MG tablet Take 1 tablet (5 mg total) by mouth daily.  30  tablet  3  . traMADol (ULTRAM) 50 MG tablet One every 4 hours if needed for pain  50 tablet  4  . triamcinolone cream (KENALOG) 0.1 % Apply 1 application topically 2 (two) times daily. Apply daily to crusty lesions.       No current facility-administered medications on file prior to visit.    Review of Systems  Constitutional: Positive for fatigue. Negative for fever, chills, diaphoresis, activity change, appetite change and unexpected weight change.  HENT: Positive for hearing loss.   Eyes: Negative.   Respiratory: Positive for cough and shortness of breath.        Complains of a hoarse voice for the last few months. There is no pain.  Cardiovascular: Negative.   Gastrointestinal:       Occasional fecal incontinence.  Endocrine:       Elevated blood sugars. Currently on "dietary control". She is not following her diet closely.  Genitourinary: Positive for urgency and frequency. Negative for dysuria, decreased urine volume, vaginal bleeding, vaginal discharge, enuresis, genital sores, vaginal pain and pelvic pain.  Musculoskeletal:       Generalized weakness: Pain in the right hip area which has become quite intense.  Skin: Negative.   Allergic/Immunologic: Negative.   Hematological: Negative.   Psychiatric/Behavioral: Negative.  Objective:BP 128/74  Pulse 83  Temp(Src) 96.1 F (35.6 C) (Oral)  Resp 14  Ht 5' 4.5" (1.638 m)  Wt 183 lb (83.008 kg)  BMI 30.94 kg/m2  SpO2 94%    Physical Exam  Constitutional: She is oriented to person, place, and time. No distress.  Overweight  HENT:  Head: Atraumatic.  Partial deafness. Uses hearing aids.  Eyes:  Wears prescription lenses.  Neck: No JVD present. No tracheal deviation present. No thyromegaly present.  Some tenderness with movement laterally to either side.  Cardiovascular: Normal rate, regular rhythm, normal heart sounds and intact distal pulses.  Exam reveals no friction rub.   No murmur heard. Pulmonary/Chest:  No respiratory distress. She has no wheezes. She has no rales. She exhibits tenderness.  Tender left flank area lower rib margins laterally and slightly anteriorly. Voice is slightly hoarse.  Abdominal: Soft. Bowel sounds are normal. She exhibits no distension. There is no tenderness. There is no rebound and no guarding.  Musculoskeletal: Normal range of motion. She exhibits no edema and no tenderness.  Tender the greater trochanter and right groin. She is having great difficulty moving through the hip area when she tries to walk. She is hobbled by this decrease in her stride length.  Lymphadenopathy:    She has no cervical adenopathy.  Neurological: She is alert and oriented to person, place, and time. No cranial nerve deficit. She exhibits normal muscle tone. Coordination normal.  Skin: No rash noted. She is not diaphoretic. No erythema. No pallor.  Psychiatric: She has a normal mood and affect. Her behavior is normal. Judgment and thought content normal.     12/12/12 CMP: NL except BUN 54, Creat 1.33  CBC: normal  TSH 2.259  A1c 6.1  UA nl 12/25/12 CXR: no active cardiopulmonary disease     Assessment & Plan:  Pain in hip joint, right: still uncomfortable, but improved since I last saw her.  Type II or unspecified type diabetes mellitus without mention of complication: controlled  Abdominal  pain, other specified site: improved  Hypertension: controlled  Screening for tuberculosis: radiology necessary due to hx of reaction to PPD in the past   - Plan: DG Chest 2 View   FL-[facility] completed [facility]

## 2012-12-27 DIAGNOSIS — H16109 Unspecified superficial keratitis, unspecified eye: Secondary | ICD-10-CM | POA: Diagnosis not present

## 2012-12-27 DIAGNOSIS — H04129 Dry eye syndrome of unspecified lacrimal gland: Secondary | ICD-10-CM | POA: Diagnosis not present

## 2012-12-27 DIAGNOSIS — H5710 Ocular pain, unspecified eye: Secondary | ICD-10-CM | POA: Diagnosis not present

## 2012-12-30 ENCOUNTER — Ambulatory Visit: Payer: Medicare Other | Admitting: Podiatry

## 2012-12-30 DIAGNOSIS — Z111 Encounter for screening for respiratory tuberculosis: Secondary | ICD-10-CM | POA: Insufficient documentation

## 2012-12-31 ENCOUNTER — Telehealth: Payer: Self-pay | Admitting: *Deleted

## 2012-12-31 ENCOUNTER — Other Ambulatory Visit: Payer: Self-pay | Admitting: *Deleted

## 2012-12-31 MED ORDER — ALBUTEROL SULFATE HFA 108 (90 BASE) MCG/ACT IN AERS: 2.0000 | INHALATION_SPRAY | Freq: Four times a day (QID) | RESPIRATORY_TRACT | Status: DC | PRN

## 2012-12-31 NOTE — Telephone Encounter (Signed)
Patient called and stated that her breathing is no better and wants to know if her inhaler should be changed to something different. States when she gets active it gets worse.

## 2012-12-31 NOTE — Telephone Encounter (Signed)
Add albuterol HFA inhaler (1) 2 puffs every 6 hours if needed to help breathing

## 2013-01-02 ENCOUNTER — Telehealth: Payer: Self-pay

## 2013-01-02 NOTE — Telephone Encounter (Signed)
Left message on VM with Dr.Green's response. Patient to call next week if symptoms persist or progress and at that time we can consider referral

## 2013-01-02 NOTE — Telephone Encounter (Signed)
Message left on triage voicemail:  1.) Patient was given rx for albuterol, patient now questions if she is to continue with Adviar?  2.) Patient would like to know Dr.Green's thoughts on her seeing a lung specialist?  Dr.Green please advise

## 2013-01-02 NOTE — Telephone Encounter (Signed)
Try albuterol. It should be added to the Advair if she is dyspneic. If this does not help, I would be glad to refer to pulmonologist next week.

## 2013-01-06 NOTE — Telephone Encounter (Signed)
Patient Notified and called in medication

## 2013-01-17 ENCOUNTER — Encounter: Payer: Self-pay | Admitting: Internal Medicine

## 2013-01-22 DIAGNOSIS — I129 Hypertensive chronic kidney disease with stage 1 through stage 4 chronic kidney disease, or unspecified chronic kidney disease: Secondary | ICD-10-CM | POA: Diagnosis not present

## 2013-01-22 DIAGNOSIS — N2581 Secondary hyperparathyroidism of renal origin: Secondary | ICD-10-CM | POA: Diagnosis not present

## 2013-01-22 DIAGNOSIS — N189 Chronic kidney disease, unspecified: Secondary | ICD-10-CM | POA: Diagnosis not present

## 2013-01-22 DIAGNOSIS — N390 Urinary tract infection, site not specified: Secondary | ICD-10-CM | POA: Diagnosis not present

## 2013-01-22 DIAGNOSIS — D649 Anemia, unspecified: Secondary | ICD-10-CM | POA: Diagnosis not present

## 2013-01-22 DIAGNOSIS — M1990 Unspecified osteoarthritis, unspecified site: Secondary | ICD-10-CM | POA: Diagnosis not present

## 2013-02-18 ENCOUNTER — Other Ambulatory Visit: Payer: Self-pay | Admitting: Specialist

## 2013-02-18 DIAGNOSIS — M549 Dorsalgia, unspecified: Secondary | ICD-10-CM

## 2013-02-21 ENCOUNTER — Ambulatory Visit
Admission: RE | Admit: 2013-02-21 | Discharge: 2013-02-21 | Disposition: A | Discharge status: Home / Self Care | Payer: Medicare Other | Source: Ambulatory Visit | Attending: Specialist | Admitting: Specialist

## 2013-02-21 VITALS — BP 167/83 | HR 66

## 2013-02-21 DIAGNOSIS — M48061 Spinal stenosis, lumbar region without neurogenic claudication: Secondary | ICD-10-CM | POA: Diagnosis not present

## 2013-02-21 DIAGNOSIS — M47817 Spondylosis without myelopathy or radiculopathy, lumbosacral region: Secondary | ICD-10-CM | POA: Diagnosis not present

## 2013-02-21 DIAGNOSIS — M549 Dorsalgia, unspecified: Secondary | ICD-10-CM

## 2013-02-21 MED ORDER — METHYLPREDNISOLONE ACETATE 40 MG/ML INJ SUSP (RADIOLOG
120.0000 mg | Freq: Once | INTRAMUSCULAR | Status: AC
  Administered 2013-02-21: 120 mg via EPIDURAL

## 2013-02-21 MED ORDER — IOHEXOL 180 MG/ML  SOLN
1.0000 mL | Freq: Once | INTRAMUSCULAR | Status: AC | PRN
  Administered 2013-02-21: 1 mL via EPIDURAL

## 2013-02-21 NOTE — Progress Notes (Signed)
Discharge instructions explained to [employer] (friend; soon-to-be Health Care POA) per patient's request.  A copy of the discharge instructions will accompany patient at discharge.  jkl

## 2013-02-25 DIAGNOSIS — R279 Unspecified lack of coordination: Secondary | ICD-10-CM | POA: Diagnosis not present

## 2013-02-25 DIAGNOSIS — M47817 Spondylosis without myelopathy or radiculopathy, lumbosacral region: Secondary | ICD-10-CM | POA: Diagnosis not present

## 2013-02-25 DIAGNOSIS — R262 Difficulty in walking, not elsewhere classified: Secondary | ICD-10-CM | POA: Diagnosis not present

## 2013-02-25 DIAGNOSIS — R0609 Other forms of dyspnea: Secondary | ICD-10-CM | POA: Diagnosis not present

## 2013-02-25 DIAGNOSIS — M6281 Muscle weakness (generalized): Secondary | ICD-10-CM | POA: Diagnosis not present

## 2013-02-25 DIAGNOSIS — E119 Type 2 diabetes mellitus without complications: Secondary | ICD-10-CM | POA: Diagnosis not present

## 2013-02-25 DIAGNOSIS — M545 Low back pain: Secondary | ICD-10-CM | POA: Diagnosis not present

## 2013-02-26 DIAGNOSIS — M6281 Muscle weakness (generalized): Secondary | ICD-10-CM | POA: Diagnosis not present

## 2013-02-26 DIAGNOSIS — M47817 Spondylosis without myelopathy or radiculopathy, lumbosacral region: Secondary | ICD-10-CM | POA: Diagnosis not present

## 2013-02-26 DIAGNOSIS — R262 Difficulty in walking, not elsewhere classified: Secondary | ICD-10-CM | POA: Diagnosis not present

## 2013-02-26 DIAGNOSIS — R0609 Other forms of dyspnea: Secondary | ICD-10-CM | POA: Diagnosis not present

## 2013-02-26 DIAGNOSIS — R279 Unspecified lack of coordination: Secondary | ICD-10-CM | POA: Diagnosis not present

## 2013-02-26 DIAGNOSIS — M545 Low back pain: Secondary | ICD-10-CM | POA: Diagnosis not present

## 2013-02-28 DIAGNOSIS — M545 Low back pain: Secondary | ICD-10-CM | POA: Diagnosis not present

## 2013-02-28 DIAGNOSIS — R0609 Other forms of dyspnea: Secondary | ICD-10-CM | POA: Diagnosis not present

## 2013-02-28 DIAGNOSIS — R262 Difficulty in walking, not elsewhere classified: Secondary | ICD-10-CM | POA: Diagnosis not present

## 2013-02-28 DIAGNOSIS — M6281 Muscle weakness (generalized): Secondary | ICD-10-CM | POA: Diagnosis not present

## 2013-02-28 DIAGNOSIS — R279 Unspecified lack of coordination: Secondary | ICD-10-CM | POA: Diagnosis not present

## 2013-02-28 DIAGNOSIS — M47817 Spondylosis without myelopathy or radiculopathy, lumbosacral region: Secondary | ICD-10-CM | POA: Diagnosis not present

## 2013-03-03 DIAGNOSIS — M47817 Spondylosis without myelopathy or radiculopathy, lumbosacral region: Secondary | ICD-10-CM | POA: Diagnosis not present

## 2013-03-03 DIAGNOSIS — R262 Difficulty in walking, not elsewhere classified: Secondary | ICD-10-CM | POA: Diagnosis not present

## 2013-03-03 DIAGNOSIS — M545 Low back pain: Secondary | ICD-10-CM | POA: Diagnosis not present

## 2013-03-03 DIAGNOSIS — M6281 Muscle weakness (generalized): Secondary | ICD-10-CM | POA: Diagnosis not present

## 2013-03-03 DIAGNOSIS — R0609 Other forms of dyspnea: Secondary | ICD-10-CM | POA: Diagnosis not present

## 2013-03-03 DIAGNOSIS — R279 Unspecified lack of coordination: Secondary | ICD-10-CM | POA: Diagnosis not present

## 2013-03-04 DIAGNOSIS — R0609 Other forms of dyspnea: Secondary | ICD-10-CM | POA: Diagnosis not present

## 2013-03-04 DIAGNOSIS — M6281 Muscle weakness (generalized): Secondary | ICD-10-CM | POA: Diagnosis not present

## 2013-03-04 DIAGNOSIS — R262 Difficulty in walking, not elsewhere classified: Secondary | ICD-10-CM | POA: Diagnosis not present

## 2013-03-04 DIAGNOSIS — M47817 Spondylosis without myelopathy or radiculopathy, lumbosacral region: Secondary | ICD-10-CM | POA: Diagnosis not present

## 2013-03-04 DIAGNOSIS — M545 Low back pain: Secondary | ICD-10-CM | POA: Diagnosis not present

## 2013-03-04 DIAGNOSIS — R279 Unspecified lack of coordination: Secondary | ICD-10-CM | POA: Diagnosis not present

## 2013-03-05 DIAGNOSIS — M545 Low back pain: Secondary | ICD-10-CM | POA: Diagnosis not present

## 2013-03-05 DIAGNOSIS — R0609 Other forms of dyspnea: Secondary | ICD-10-CM | POA: Diagnosis not present

## 2013-03-05 DIAGNOSIS — M47817 Spondylosis without myelopathy or radiculopathy, lumbosacral region: Secondary | ICD-10-CM | POA: Diagnosis not present

## 2013-03-05 DIAGNOSIS — R262 Difficulty in walking, not elsewhere classified: Secondary | ICD-10-CM | POA: Diagnosis not present

## 2013-03-05 DIAGNOSIS — M6281 Muscle weakness (generalized): Secondary | ICD-10-CM | POA: Diagnosis not present

## 2013-03-05 DIAGNOSIS — R279 Unspecified lack of coordination: Secondary | ICD-10-CM | POA: Diagnosis not present

## 2013-03-07 DIAGNOSIS — R279 Unspecified lack of coordination: Secondary | ICD-10-CM | POA: Diagnosis not present

## 2013-03-07 DIAGNOSIS — R262 Difficulty in walking, not elsewhere classified: Secondary | ICD-10-CM | POA: Diagnosis not present

## 2013-03-07 DIAGNOSIS — M6281 Muscle weakness (generalized): Secondary | ICD-10-CM | POA: Diagnosis not present

## 2013-03-07 DIAGNOSIS — M47817 Spondylosis without myelopathy or radiculopathy, lumbosacral region: Secondary | ICD-10-CM | POA: Diagnosis not present

## 2013-03-07 DIAGNOSIS — R0609 Other forms of dyspnea: Secondary | ICD-10-CM | POA: Diagnosis not present

## 2013-03-07 DIAGNOSIS — R41841 Cognitive communication deficit: Secondary | ICD-10-CM | POA: Diagnosis not present

## 2013-03-07 DIAGNOSIS — R1310 Dysphagia, unspecified: Secondary | ICD-10-CM | POA: Diagnosis not present

## 2013-03-07 DIAGNOSIS — M545 Low back pain, unspecified: Secondary | ICD-10-CM | POA: Diagnosis not present

## 2013-03-07 DIAGNOSIS — E119 Type 2 diabetes mellitus without complications: Secondary | ICD-10-CM | POA: Diagnosis not present

## 2013-03-10 DIAGNOSIS — M545 Low back pain, unspecified: Secondary | ICD-10-CM | POA: Diagnosis not present

## 2013-03-10 DIAGNOSIS — M6281 Muscle weakness (generalized): Secondary | ICD-10-CM | POA: Diagnosis not present

## 2013-03-10 DIAGNOSIS — R1310 Dysphagia, unspecified: Secondary | ICD-10-CM | POA: Diagnosis not present

## 2013-03-10 DIAGNOSIS — R279 Unspecified lack of coordination: Secondary | ICD-10-CM | POA: Diagnosis not present

## 2013-03-10 DIAGNOSIS — R41841 Cognitive communication deficit: Secondary | ICD-10-CM | POA: Diagnosis not present

## 2013-03-10 DIAGNOSIS — R262 Difficulty in walking, not elsewhere classified: Secondary | ICD-10-CM | POA: Diagnosis not present

## 2013-03-11 DIAGNOSIS — M545 Low back pain, unspecified: Secondary | ICD-10-CM | POA: Diagnosis not present

## 2013-03-11 DIAGNOSIS — R262 Difficulty in walking, not elsewhere classified: Secondary | ICD-10-CM | POA: Diagnosis not present

## 2013-03-11 DIAGNOSIS — M6281 Muscle weakness (generalized): Secondary | ICD-10-CM | POA: Diagnosis not present

## 2013-03-11 DIAGNOSIS — R279 Unspecified lack of coordination: Secondary | ICD-10-CM | POA: Diagnosis not present

## 2013-03-11 DIAGNOSIS — R41841 Cognitive communication deficit: Secondary | ICD-10-CM | POA: Diagnosis not present

## 2013-03-11 DIAGNOSIS — R1310 Dysphagia, unspecified: Secondary | ICD-10-CM | POA: Diagnosis not present

## 2013-03-12 DIAGNOSIS — R1310 Dysphagia, unspecified: Secondary | ICD-10-CM | POA: Diagnosis not present

## 2013-03-12 DIAGNOSIS — R262 Difficulty in walking, not elsewhere classified: Secondary | ICD-10-CM | POA: Diagnosis not present

## 2013-03-12 DIAGNOSIS — R41841 Cognitive communication deficit: Secondary | ICD-10-CM | POA: Diagnosis not present

## 2013-03-12 DIAGNOSIS — M545 Low back pain, unspecified: Secondary | ICD-10-CM | POA: Diagnosis not present

## 2013-03-12 DIAGNOSIS — M6281 Muscle weakness (generalized): Secondary | ICD-10-CM | POA: Diagnosis not present

## 2013-03-12 DIAGNOSIS — R279 Unspecified lack of coordination: Secondary | ICD-10-CM | POA: Diagnosis not present

## 2013-03-13 DIAGNOSIS — R1310 Dysphagia, unspecified: Secondary | ICD-10-CM | POA: Diagnosis not present

## 2013-03-13 DIAGNOSIS — M6281 Muscle weakness (generalized): Secondary | ICD-10-CM | POA: Diagnosis not present

## 2013-03-13 DIAGNOSIS — R262 Difficulty in walking, not elsewhere classified: Secondary | ICD-10-CM | POA: Diagnosis not present

## 2013-03-13 DIAGNOSIS — M545 Low back pain, unspecified: Secondary | ICD-10-CM | POA: Diagnosis not present

## 2013-03-13 DIAGNOSIS — R41841 Cognitive communication deficit: Secondary | ICD-10-CM | POA: Diagnosis not present

## 2013-03-13 DIAGNOSIS — R279 Unspecified lack of coordination: Secondary | ICD-10-CM | POA: Diagnosis not present

## 2013-03-14 DIAGNOSIS — R279 Unspecified lack of coordination: Secondary | ICD-10-CM | POA: Diagnosis not present

## 2013-03-14 DIAGNOSIS — M545 Low back pain, unspecified: Secondary | ICD-10-CM | POA: Diagnosis not present

## 2013-03-14 DIAGNOSIS — R41841 Cognitive communication deficit: Secondary | ICD-10-CM | POA: Diagnosis not present

## 2013-03-14 DIAGNOSIS — R1310 Dysphagia, unspecified: Secondary | ICD-10-CM | POA: Diagnosis not present

## 2013-03-14 DIAGNOSIS — R262 Difficulty in walking, not elsewhere classified: Secondary | ICD-10-CM | POA: Diagnosis not present

## 2013-03-14 DIAGNOSIS — M6281 Muscle weakness (generalized): Secondary | ICD-10-CM | POA: Diagnosis not present

## 2013-03-17 DIAGNOSIS — R262 Difficulty in walking, not elsewhere classified: Secondary | ICD-10-CM | POA: Diagnosis not present

## 2013-03-17 DIAGNOSIS — M6281 Muscle weakness (generalized): Secondary | ICD-10-CM | POA: Diagnosis not present

## 2013-03-17 DIAGNOSIS — M545 Low back pain, unspecified: Secondary | ICD-10-CM | POA: Diagnosis not present

## 2013-03-17 DIAGNOSIS — R41841 Cognitive communication deficit: Secondary | ICD-10-CM | POA: Diagnosis not present

## 2013-03-17 DIAGNOSIS — R279 Unspecified lack of coordination: Secondary | ICD-10-CM | POA: Diagnosis not present

## 2013-03-17 DIAGNOSIS — R1310 Dysphagia, unspecified: Secondary | ICD-10-CM | POA: Diagnosis not present

## 2013-03-18 DIAGNOSIS — R262 Difficulty in walking, not elsewhere classified: Secondary | ICD-10-CM | POA: Diagnosis not present

## 2013-03-18 DIAGNOSIS — R279 Unspecified lack of coordination: Secondary | ICD-10-CM | POA: Diagnosis not present

## 2013-03-18 DIAGNOSIS — R1310 Dysphagia, unspecified: Secondary | ICD-10-CM | POA: Diagnosis not present

## 2013-03-18 DIAGNOSIS — R41841 Cognitive communication deficit: Secondary | ICD-10-CM | POA: Diagnosis not present

## 2013-03-18 DIAGNOSIS — M6281 Muscle weakness (generalized): Secondary | ICD-10-CM | POA: Diagnosis not present

## 2013-03-18 DIAGNOSIS — M545 Low back pain, unspecified: Secondary | ICD-10-CM | POA: Diagnosis not present

## 2013-03-19 DIAGNOSIS — M545 Low back pain, unspecified: Secondary | ICD-10-CM | POA: Diagnosis not present

## 2013-03-19 DIAGNOSIS — R279 Unspecified lack of coordination: Secondary | ICD-10-CM | POA: Diagnosis not present

## 2013-03-19 DIAGNOSIS — R41841 Cognitive communication deficit: Secondary | ICD-10-CM | POA: Diagnosis not present

## 2013-03-19 DIAGNOSIS — M6281 Muscle weakness (generalized): Secondary | ICD-10-CM | POA: Diagnosis not present

## 2013-03-19 DIAGNOSIS — R262 Difficulty in walking, not elsewhere classified: Secondary | ICD-10-CM | POA: Diagnosis not present

## 2013-03-19 DIAGNOSIS — R1310 Dysphagia, unspecified: Secondary | ICD-10-CM | POA: Diagnosis not present

## 2013-03-20 DIAGNOSIS — R1310 Dysphagia, unspecified: Secondary | ICD-10-CM | POA: Diagnosis not present

## 2013-03-20 DIAGNOSIS — R41841 Cognitive communication deficit: Secondary | ICD-10-CM | POA: Diagnosis not present

## 2013-03-20 DIAGNOSIS — M6281 Muscle weakness (generalized): Secondary | ICD-10-CM | POA: Diagnosis not present

## 2013-03-20 DIAGNOSIS — M545 Low back pain, unspecified: Secondary | ICD-10-CM | POA: Diagnosis not present

## 2013-03-20 DIAGNOSIS — R279 Unspecified lack of coordination: Secondary | ICD-10-CM | POA: Diagnosis not present

## 2013-03-20 DIAGNOSIS — R262 Difficulty in walking, not elsewhere classified: Secondary | ICD-10-CM | POA: Diagnosis not present

## 2013-03-21 ENCOUNTER — Telehealth: Payer: Self-pay | Admitting: *Deleted

## 2013-03-21 DIAGNOSIS — R262 Difficulty in walking, not elsewhere classified: Secondary | ICD-10-CM | POA: Diagnosis not present

## 2013-03-21 DIAGNOSIS — M6281 Muscle weakness (generalized): Secondary | ICD-10-CM | POA: Diagnosis not present

## 2013-03-21 DIAGNOSIS — R41841 Cognitive communication deficit: Secondary | ICD-10-CM | POA: Diagnosis not present

## 2013-03-21 DIAGNOSIS — R1310 Dysphagia, unspecified: Secondary | ICD-10-CM | POA: Diagnosis not present

## 2013-03-21 DIAGNOSIS — M545 Low back pain, unspecified: Secondary | ICD-10-CM | POA: Diagnosis not present

## 2013-03-21 DIAGNOSIS — R279 Unspecified lack of coordination: Secondary | ICD-10-CM | POA: Diagnosis not present

## 2013-03-21 NOTE — Telephone Encounter (Signed)
Spoke to pt's daughter regarding getting a order to be able to an order to be able to take Coricidin HB for her cold symptoms.  Since it is 4:55 on Friday, advised her that it wouldn't be done today and would get a message to Dr Glade LloydPandey.   Is it okay for he to take the Coricidin HB for cold symptoms that has been there for 3 weeks.  She has an appt on 1/21. Please advise. Thanks  Pt's daughter # is [phone]

## 2013-03-24 ENCOUNTER — Other Ambulatory Visit: Payer: Self-pay | Admitting: *Deleted

## 2013-03-24 DIAGNOSIS — R279 Unspecified lack of coordination: Secondary | ICD-10-CM | POA: Diagnosis not present

## 2013-03-24 DIAGNOSIS — R262 Difficulty in walking, not elsewhere classified: Secondary | ICD-10-CM | POA: Diagnosis not present

## 2013-03-24 DIAGNOSIS — R41841 Cognitive communication deficit: Secondary | ICD-10-CM | POA: Diagnosis not present

## 2013-03-24 DIAGNOSIS — M545 Low back pain, unspecified: Secondary | ICD-10-CM | POA: Diagnosis not present

## 2013-03-24 DIAGNOSIS — R1310 Dysphagia, unspecified: Secondary | ICD-10-CM | POA: Diagnosis not present

## 2013-03-24 DIAGNOSIS — M6281 Muscle weakness (generalized): Secondary | ICD-10-CM | POA: Diagnosis not present

## 2013-03-24 NOTE — Telephone Encounter (Signed)
rx for Coricidin HPB [facility] @ [phone].

## 2013-03-24 NOTE — Telephone Encounter (Signed)
It should be ok. pls call coicidin hbp 2 tab every 6 hours as needed until seen in office 03/26/13

## 2013-03-25 DIAGNOSIS — R279 Unspecified lack of coordination: Secondary | ICD-10-CM | POA: Diagnosis not present

## 2013-03-25 DIAGNOSIS — M545 Low back pain, unspecified: Secondary | ICD-10-CM | POA: Diagnosis not present

## 2013-03-25 DIAGNOSIS — R262 Difficulty in walking, not elsewhere classified: Secondary | ICD-10-CM | POA: Diagnosis not present

## 2013-03-25 DIAGNOSIS — R41841 Cognitive communication deficit: Secondary | ICD-10-CM | POA: Diagnosis not present

## 2013-03-25 DIAGNOSIS — R1310 Dysphagia, unspecified: Secondary | ICD-10-CM | POA: Diagnosis not present

## 2013-03-25 DIAGNOSIS — M6281 Muscle weakness (generalized): Secondary | ICD-10-CM | POA: Diagnosis not present

## 2013-03-26 ENCOUNTER — Ambulatory Visit (INDEPENDENT_AMBULATORY_CARE_PROVIDER_SITE_OTHER): Payer: Medicare Other | Admitting: Nurse Practitioner

## 2013-03-26 ENCOUNTER — Encounter: Payer: Self-pay | Admitting: Nurse Practitioner

## 2013-03-26 VITALS — BP 122/76 | HR 74 | Temp 98.1°F | Resp 14 | Wt 191.0 lb

## 2013-03-26 DIAGNOSIS — R279 Unspecified lack of coordination: Secondary | ICD-10-CM | POA: Diagnosis not present

## 2013-03-26 DIAGNOSIS — R5383 Other fatigue: Secondary | ICD-10-CM

## 2013-03-26 DIAGNOSIS — M545 Low back pain, unspecified: Secondary | ICD-10-CM | POA: Diagnosis not present

## 2013-03-26 DIAGNOSIS — J4489 Other specified chronic obstructive pulmonary disease: Secondary | ICD-10-CM

## 2013-03-26 DIAGNOSIS — J449 Chronic obstructive pulmonary disease, unspecified: Secondary | ICD-10-CM | POA: Diagnosis not present

## 2013-03-26 DIAGNOSIS — E119 Type 2 diabetes mellitus without complications: Secondary | ICD-10-CM

## 2013-03-26 DIAGNOSIS — N390 Urinary tract infection, site not specified: Secondary | ICD-10-CM | POA: Diagnosis not present

## 2013-03-26 DIAGNOSIS — R41841 Cognitive communication deficit: Secondary | ICD-10-CM | POA: Diagnosis not present

## 2013-03-26 DIAGNOSIS — M6281 Muscle weakness (generalized): Secondary | ICD-10-CM | POA: Diagnosis not present

## 2013-03-26 DIAGNOSIS — R5381 Other malaise: Secondary | ICD-10-CM | POA: Diagnosis not present

## 2013-03-26 DIAGNOSIS — R1310 Dysphagia, unspecified: Secondary | ICD-10-CM | POA: Diagnosis not present

## 2013-03-26 DIAGNOSIS — R262 Difficulty in walking, not elsewhere classified: Secondary | ICD-10-CM | POA: Diagnosis not present

## 2013-03-26 HISTORY — DX: Chronic obstructive pulmonary disease, unspecified: J44.9

## 2013-03-26 MED ORDER — TIOTROPIUM BROMIDE MONOHYDRATE 18 MCG IN CAPS: 18.0000 ug | ORAL_CAPSULE | Freq: Every day | RESPIRATORY_TRACT | Status: DC

## 2013-03-26 NOTE — Patient Instructions (Signed)
Blood work done today Coricidin HPB to be scheduled for twice daily for 14 days Spiriva inhaler 18 mcg to be inhaled daily Pt can have albuterol with her if she meets facility standards for this  Keep follow up with Dr Chilton SiGreen

## 2013-03-26 NOTE — Progress Notes (Signed)
Patient ID: Christine Pierce, female   DOB: 13-May-1921, 80 y.o.   MRN: [id number]    Allergies  Allergen Reactions  . Macrodantin [Nitrofurantoin Macrocrystal]     Chief Complaint  Patient presents with  . Acute Visit    cough/chest congestion, SOB with exertion, and weakness x 2 weeks  . other    having trouble with getting her PRN medication when it is needed due to not getting her Mucinex for several days as well as not getting any Pro Air but once in the last month.  . other    c/o LT ankle looking different from RT ankle; also fell at Christmas time, now having pain in RT knee , using a stimulator for pain and PT at facility    HPI: Patient is a 80 y.o. female seen in the office today for cough congestion and weakness for 2 weeks; pt is not taking Coricidin HPB because she has to ask for it; needs a order for this to be scheduled; overall cough and congestion is better; no fevers, has shortness of breath and chest congestion has made it worse -would like to have albuterol on hand, it is at the nursing station-- needs something else added for her shortness of breath   Review of Systems:  Review of Systems  Constitutional: Positive for malaise/fatigue. Negative for fever and chills.  HENT: Negative for congestion and sore throat.   Respiratory: Positive for cough and shortness of breath. Negative for sputum production and wheezing.   Cardiovascular: Negative for chest pain and palpitations.  Gastrointestinal: Negative for heartburn, diarrhea and constipation.  Genitourinary: Negative for dysuria.  Neurological: Negative for weakness and headaches.     Past Medical History  Diagnosis Date  . Hypertension 10/23/2011  . Renal disorder   . Shortness of breath 04/30/2012  . Other nonspecific abnormal finding of lung field   . Contact with or exposure to unspecified communicable disease   . Cystocele, midline 04/10/2012  . Cough 04/10/2012  . Full incontinence of feces 04/10/2012    . Urgency of urination 04/10/2012  . Abdominal pain, other specified site   . Orthopnea 12/01/2011  . Type II or unspecified type diabetes mellitus without mention of complication, not stated as uncontrolled 11/16/2009  . Other and unspecified hyperlipidemia 10/20/2011  . Urinary tract infection, site not specified 09/11/2011  . Muscle weakness (generalized) 09/11/2011  . Syncope and collapse 03/29/2011  . Pain in joint, ankle and foot 12/27/2010  . Sprain of ribs 08/15/2010  . Unspecified tinnitus 07/04/2010  . Pneumonia, organism unspecified 05/04/2010  . Diverticulosis of colon (without mention of hemorrhage)   . Other and unspecified ovarian cyst 05/04/2010  . Abdominal pain, right lower quadrant 05/02/2010  . Anxiety state, unspecified 02/14/2010  . Shortness of breath 01/05/2010  . Chronic kidney disease, stage III (moderate) 12/15/09  . Nausea alone 12/13/2009  . Dehydration   . Acute kidney failure, unspecified 12/10/2009  . Pain in joint, shoulder region 11/15/2009  . Other abnormal blood chemistry   . Pain in joint, pelvic region and thigh 07/29/2009  . Personal history of fall 07/29/2009  . Other malaise and fatigue 02/15/2009  . Obesity, unspecified 07/16/2008  . Hydronephrosis 07/16/2008  . Unspecified hereditary and idiopathic peripheral neuropathy   . Reflux esophagitis 10/18/2007  . Unspecified vitamin D deficiency 07/19/2007  . Chest pain, unspecified 07/19/2007  . Varicose veins of lower extremities with inflammation 04/25/2006  . Restless legs syndrome (RLS) 01/16/2006  . Nocturia 07/07/2005  .  Encounter for long-term (current) use of other medications   . Cervicalgia 01/20/2004  . Osteoporosis, unspecified 01/23/2003  . Sciatica 04/03/2001  . Routine general medical examination at a health care facility 11/15/2009  . Pain in joint, lower leg 12/11/2001  . Lumbago 02/20/2001   Past Surgical History  Procedure Laterality Date  . Tonsillectomy and  adenoidectomy    . Appendectomy    . Stapedectomy Bilateral [phone]    middle ear surgery  . Eye surgery Left 1988    cataract w IOL implant, Dr. Vic Ripper  . Fracture surgery Right 09/2004    distal radius/Dr. Teressa Senter   Social History:   reports that she has never smoked. She does not have any smokeless tobacco history on file. She reports that she does not drink alcohol or use illicit drugs.  Family History  Problem Relation Age of Onset  . Cancer Mother     abdomen  . Cancer Father     brain, lung  . Heart disease Son     Medications: Patient's Medications  New Prescriptions   No medications on file  Previous Medications   ACETAMINOPHEN (TYLENOL) 325 MG TABLET    Take 650 mg by mouth every 4 (four) hours as needed for pain.   ALBUTEROL (PROVENTIL HFA;VENTOLIN HFA) 108 (90 BASE) MCG/ACT INHALER    Inhale 2 puffs into the lungs every 6 (six) hours as needed for wheezing.   ASPIRIN EC 81 MG TABLET    Take 81 mg by mouth daily. Take 1 tablet daily to prevent heart attack, stroke and blood clot.   FLUTICASONE-SALMETEROL (ADVAIR) 100-50 MCG/DOSE AEPB    Inhale once every 12 hours to help breathing   LOSARTAN-HYDROCHLOROTHIAZIDE (HYZAAR) 50-12.5 MG PER TABLET    Take 1 tablet by mouth daily. Take one tablet daily for blood pressure.   LOVASTATIN (MEVACOR) 40 MG TABLET    TAKE 1 TABLET DAILY TO LOWER CHOLESTEROL   MISC. DEVICES (ROLLER WALKER) MISC    Rolling Walker with seat and brakes.   MULTIPLE VITAMINS-MINERALS (MULTIVITAMIN WITH MINERALS) TABLET    Take 1 tablet by mouth daily. Take 1 tablet daily for vitamin supplement.   NITROGLYCERIN (NITROSTAT) 0.4 MG SL TABLET    Place 0.4 mg under the tongue every 5 (five) minutes as needed for chest pain.   SOLIFENACIN (VESICARE) 5 MG TABLET    Take 1 tablet (5 mg total) by mouth daily.   TRAMADOL (ULTRAM) 50 MG TABLET    One every 4 hours if needed for pain   TRIAMCINOLONE CREAM (KENALOG) 0.1 %    Apply 1 application topically 2 (two)  times daily. Apply daily to crusty lesions.  Modified Medications   No medications on file  Discontinued Medications   No medications on file     Physical Exam:  Filed Vitals:   03/26/13 1606  BP: 122/76  Pulse: 74  Temp: 98.1 F (36.7 C)  TempSrc: Oral  Resp: 14  Weight: 191 lb (86.637 kg)  SpO2: 96%    GENERAL APPEARANCE: Alert, conversant. Appropriately groomed. No acute distress.  SKIN: No diaphoresis rash, or wounds HEAD: Normocephalic, atraumatic  EYES: Conjunctiva/lids clear. Pupils round, reactive. EOMs intact.  NOSE: No deformity or discharge.  MOUTH/THROAT: Lips w/o lesions. Mouth and throat normal. Tongue moist, w/o lesion.  NECK: No thyroid tenderness, enlargement or nodule  RESPIRATORY: Breathing is even, unlabored. Lung sounds are clear   CARDIOVASCULAR: Heart RRR no murmurs, rubs or gallops. No peripheral edema.  ARTERIAL: radial pulse  2+ VENOUS: has varicosities. GASTROINTESTINAL: Abdomen is soft, non-tender, not distended w/ normal bowel sounds. GENITOURINARY: Bladder non tender, not distended  MUSCULOSKELETAL: No abnormal joints or musculature PSYCHIATRIC: Mood and affect appropriate to situation   Assessment/Plan 1. COPD (chronic obstructive pulmonary disease) -ongoing cough and congestion with worsening shortness of breath -conts advair; and PRN albuterol -Coricidin HPB BID for 14 days - will start tiotropium (SPIRIVA HANDIHALER) 18 MCG inhalation capsule; Place 1 capsule (18 mcg total) into inhaler and inhale daily.  Dispense: 30 capsule; Refill: 3  2. Other malaise and fatigue -ongoing -encouraged proper nutrition; 3 meal a day, good hydration - CBC With differential/Platelet - TSH  3. Type II or unspecified type diabetes mellitus without mention of complication, not stated as uncontrolled - Comprehensive metabolic panel - Hemoglobin A1c

## 2013-03-27 DIAGNOSIS — R279 Unspecified lack of coordination: Secondary | ICD-10-CM | POA: Diagnosis not present

## 2013-03-27 DIAGNOSIS — R1310 Dysphagia, unspecified: Secondary | ICD-10-CM | POA: Diagnosis not present

## 2013-03-27 DIAGNOSIS — M6281 Muscle weakness (generalized): Secondary | ICD-10-CM | POA: Diagnosis not present

## 2013-03-27 DIAGNOSIS — R41841 Cognitive communication deficit: Secondary | ICD-10-CM | POA: Diagnosis not present

## 2013-03-27 DIAGNOSIS — M545 Low back pain, unspecified: Secondary | ICD-10-CM | POA: Diagnosis not present

## 2013-03-27 DIAGNOSIS — R262 Difficulty in walking, not elsewhere classified: Secondary | ICD-10-CM | POA: Diagnosis not present

## 2013-03-27 LAB — CBC WITH DIFFERENTIAL
Basophils Absolute: 0 10*3/uL (ref 0.0–0.2)
Basos: 1 %
EOS ABS: 0.1 10*3/uL (ref 0.0–0.4)
Eos: 2 %
HCT: 34.4 % (ref 34.0–46.6)
Hemoglobin: 11.3 g/dL (ref 11.1–15.9)
IMMATURE GRANS (ABS): 0 10*3/uL (ref 0.0–0.1)
IMMATURE GRANULOCYTES: 0 %
LYMPHS: 26 %
Lymphocytes Absolute: 1.2 10*3/uL (ref 0.7–3.1)
MCH: 30.1 pg (ref 26.6–33.0)
MCHC: 32.8 g/dL (ref 31.5–35.7)
MCV: 92 fL (ref 79–97)
Monocytes Absolute: 0.6 10*3/uL (ref 0.1–0.9)
Monocytes: 14 %
NEUTROS PCT: 57 %
Neutrophils Absolute: 2.5 10*3/uL (ref 1.4–7.0)
PLATELETS: 256 10*3/uL (ref 150–379)
RBC: 3.76 x10E6/uL — ABNORMAL LOW (ref 3.77–5.28)
RDW: 14.6 % (ref 12.3–15.4)
WBC: 4.4 10*3/uL (ref 3.4–10.8)

## 2013-03-27 LAB — COMPREHENSIVE METABOLIC PANEL
ALT: 8 IU/L (ref 0–32)
AST: 17 IU/L (ref 0–40)
Albumin/Globulin Ratio: 2.4 (ref 1.1–2.5)
Albumin: 4.3 g/dL (ref 3.5–4.7)
Alkaline Phosphatase: 73 IU/L (ref 39–117)
BUN/Creatinine Ratio: 22 (ref 11–26)
BUN: 29 mg/dL — ABNORMAL HIGH (ref 8–27)
CALCIUM: 9.8 mg/dL (ref 8.7–10.3)
CHLORIDE: 104 mmol/L (ref 97–108)
CO2: 22 mmol/L (ref 18–29)
Creatinine, Ser: 1.31 mg/dL — ABNORMAL HIGH (ref 0.57–1.00)
GFR calc Af Amer: 42 mL/min/{1.73_m2} — ABNORMAL LOW (ref >59)
GFR calc non Af Amer: 36 mL/min/{1.73_m2} — ABNORMAL LOW (ref >59)
GLUCOSE: 99 mg/dL (ref 65–99)
Globulin, Total: 1.8 g/dL (ref 1.5–4.5)
POTASSIUM: 4.9 mmol/L (ref 3.5–5.2)
SODIUM: 142 mmol/L (ref 134–144)
Total Bilirubin: 0.2 mg/dL (ref 0.0–1.2)
Total Protein: 6.1 g/dL (ref 6.0–8.5)

## 2013-03-27 LAB — HEMOGLOBIN A1C
ESTIMATED AVERAGE GLUCOSE: 126 mg/dL
HEMOGLOBIN A1C: 6 % — AB (ref 4.8–5.6)

## 2013-03-27 LAB — TSH: TSH: 1.15 u[IU]/mL (ref 0.450–4.500)

## 2013-03-28 DIAGNOSIS — M6281 Muscle weakness (generalized): Secondary | ICD-10-CM | POA: Diagnosis not present

## 2013-03-28 DIAGNOSIS — R262 Difficulty in walking, not elsewhere classified: Secondary | ICD-10-CM | POA: Diagnosis not present

## 2013-03-28 DIAGNOSIS — R1310 Dysphagia, unspecified: Secondary | ICD-10-CM | POA: Diagnosis not present

## 2013-03-28 DIAGNOSIS — R279 Unspecified lack of coordination: Secondary | ICD-10-CM | POA: Diagnosis not present

## 2013-03-28 DIAGNOSIS — R41841 Cognitive communication deficit: Secondary | ICD-10-CM | POA: Diagnosis not present

## 2013-03-28 DIAGNOSIS — M545 Low back pain, unspecified: Secondary | ICD-10-CM | POA: Diagnosis not present

## 2013-04-02 DIAGNOSIS — M6281 Muscle weakness (generalized): Secondary | ICD-10-CM | POA: Diagnosis not present

## 2013-04-02 DIAGNOSIS — M545 Low back pain, unspecified: Secondary | ICD-10-CM | POA: Diagnosis not present

## 2013-04-02 DIAGNOSIS — R262 Difficulty in walking, not elsewhere classified: Secondary | ICD-10-CM | POA: Diagnosis not present

## 2013-04-02 DIAGNOSIS — R279 Unspecified lack of coordination: Secondary | ICD-10-CM | POA: Diagnosis not present

## 2013-04-02 DIAGNOSIS — R1310 Dysphagia, unspecified: Secondary | ICD-10-CM | POA: Diagnosis not present

## 2013-04-02 DIAGNOSIS — R41841 Cognitive communication deficit: Secondary | ICD-10-CM | POA: Diagnosis not present

## 2013-04-03 DIAGNOSIS — R279 Unspecified lack of coordination: Secondary | ICD-10-CM | POA: Diagnosis not present

## 2013-04-03 DIAGNOSIS — J449 Chronic obstructive pulmonary disease, unspecified: Secondary | ICD-10-CM | POA: Diagnosis not present

## 2013-04-03 DIAGNOSIS — N318 Other neuromuscular dysfunction of bladder: Secondary | ICD-10-CM | POA: Diagnosis not present

## 2013-04-03 DIAGNOSIS — M545 Low back pain, unspecified: Secondary | ICD-10-CM | POA: Diagnosis not present

## 2013-04-03 DIAGNOSIS — R262 Difficulty in walking, not elsewhere classified: Secondary | ICD-10-CM | POA: Diagnosis not present

## 2013-04-03 DIAGNOSIS — R1310 Dysphagia, unspecified: Secondary | ICD-10-CM | POA: Diagnosis not present

## 2013-04-03 DIAGNOSIS — H6120 Impacted cerumen, unspecified ear: Secondary | ICD-10-CM | POA: Diagnosis not present

## 2013-04-03 DIAGNOSIS — M6281 Muscle weakness (generalized): Secondary | ICD-10-CM | POA: Diagnosis not present

## 2013-04-03 DIAGNOSIS — R41841 Cognitive communication deficit: Secondary | ICD-10-CM | POA: Diagnosis not present

## 2013-04-03 DIAGNOSIS — K219 Gastro-esophageal reflux disease without esophagitis: Secondary | ICD-10-CM | POA: Diagnosis not present

## 2013-04-04 DIAGNOSIS — R279 Unspecified lack of coordination: Secondary | ICD-10-CM | POA: Diagnosis not present

## 2013-04-04 DIAGNOSIS — M6281 Muscle weakness (generalized): Secondary | ICD-10-CM | POA: Diagnosis not present

## 2013-04-04 DIAGNOSIS — M545 Low back pain, unspecified: Secondary | ICD-10-CM | POA: Diagnosis not present

## 2013-04-04 DIAGNOSIS — R41841 Cognitive communication deficit: Secondary | ICD-10-CM | POA: Diagnosis not present

## 2013-04-04 DIAGNOSIS — R262 Difficulty in walking, not elsewhere classified: Secondary | ICD-10-CM | POA: Diagnosis not present

## 2013-04-04 DIAGNOSIS — R1310 Dysphagia, unspecified: Secondary | ICD-10-CM | POA: Diagnosis not present

## 2013-04-07 DIAGNOSIS — M545 Low back pain, unspecified: Secondary | ICD-10-CM | POA: Diagnosis not present

## 2013-04-07 DIAGNOSIS — R262 Difficulty in walking, not elsewhere classified: Secondary | ICD-10-CM | POA: Diagnosis not present

## 2013-04-07 DIAGNOSIS — R1310 Dysphagia, unspecified: Secondary | ICD-10-CM | POA: Diagnosis not present

## 2013-04-07 DIAGNOSIS — R279 Unspecified lack of coordination: Secondary | ICD-10-CM | POA: Diagnosis not present

## 2013-04-07 DIAGNOSIS — M47817 Spondylosis without myelopathy or radiculopathy, lumbosacral region: Secondary | ICD-10-CM | POA: Diagnosis not present

## 2013-04-07 DIAGNOSIS — E119 Type 2 diabetes mellitus without complications: Secondary | ICD-10-CM | POA: Diagnosis not present

## 2013-04-07 DIAGNOSIS — M6281 Muscle weakness (generalized): Secondary | ICD-10-CM | POA: Diagnosis not present

## 2013-04-07 DIAGNOSIS — R41841 Cognitive communication deficit: Secondary | ICD-10-CM | POA: Diagnosis not present

## 2013-04-07 DIAGNOSIS — R0609 Other forms of dyspnea: Secondary | ICD-10-CM | POA: Diagnosis not present

## 2013-04-08 ENCOUNTER — Telehealth: Payer: Self-pay | Admitting: *Deleted

## 2013-04-08 DIAGNOSIS — R41841 Cognitive communication deficit: Secondary | ICD-10-CM | POA: Diagnosis not present

## 2013-04-08 DIAGNOSIS — R1310 Dysphagia, unspecified: Secondary | ICD-10-CM | POA: Diagnosis not present

## 2013-04-08 DIAGNOSIS — R262 Difficulty in walking, not elsewhere classified: Secondary | ICD-10-CM | POA: Diagnosis not present

## 2013-04-08 DIAGNOSIS — M6281 Muscle weakness (generalized): Secondary | ICD-10-CM | POA: Diagnosis not present

## 2013-04-08 DIAGNOSIS — R279 Unspecified lack of coordination: Secondary | ICD-10-CM | POA: Diagnosis not present

## 2013-04-08 DIAGNOSIS — M545 Low back pain, unspecified: Secondary | ICD-10-CM | POA: Diagnosis not present

## 2013-04-08 NOTE — Telephone Encounter (Signed)
Son called and wanted to know the status on the [employer] Forms for AL for his mother. Called the son and no answer---left message for him to return call.

## 2013-04-09 ENCOUNTER — Encounter: Payer: Self-pay | Admitting: Internal Medicine

## 2013-04-09 DIAGNOSIS — R262 Difficulty in walking, not elsewhere classified: Secondary | ICD-10-CM | POA: Diagnosis not present

## 2013-04-09 DIAGNOSIS — R41841 Cognitive communication deficit: Secondary | ICD-10-CM | POA: Diagnosis not present

## 2013-04-09 DIAGNOSIS — M6281 Muscle weakness (generalized): Secondary | ICD-10-CM | POA: Diagnosis not present

## 2013-04-09 DIAGNOSIS — R1310 Dysphagia, unspecified: Secondary | ICD-10-CM | POA: Diagnosis not present

## 2013-04-09 DIAGNOSIS — M545 Low back pain, unspecified: Secondary | ICD-10-CM | POA: Diagnosis not present

## 2013-04-09 DIAGNOSIS — R279 Unspecified lack of coordination: Secondary | ICD-10-CM | POA: Diagnosis not present

## 2013-04-10 ENCOUNTER — Ambulatory Visit (INDEPENDENT_AMBULATORY_CARE_PROVIDER_SITE_OTHER): Payer: Medicare Other | Admitting: Nurse Practitioner

## 2013-04-10 ENCOUNTER — Encounter: Payer: Self-pay | Admitting: Nurse Practitioner

## 2013-04-10 VITALS — BP 118/70 | HR 89 | Temp 97.0°F | Wt 191.0 lb

## 2013-04-10 DIAGNOSIS — N318 Other neuromuscular dysfunction of bladder: Secondary | ICD-10-CM | POA: Diagnosis not present

## 2013-04-10 DIAGNOSIS — K219 Gastro-esophageal reflux disease without esophagitis: Secondary | ICD-10-CM

## 2013-04-10 DIAGNOSIS — R279 Unspecified lack of coordination: Secondary | ICD-10-CM | POA: Diagnosis not present

## 2013-04-10 DIAGNOSIS — N3281 Overactive bladder: Secondary | ICD-10-CM

## 2013-04-10 DIAGNOSIS — J449 Chronic obstructive pulmonary disease, unspecified: Secondary | ICD-10-CM | POA: Diagnosis not present

## 2013-04-10 DIAGNOSIS — R262 Difficulty in walking, not elsewhere classified: Secondary | ICD-10-CM | POA: Diagnosis not present

## 2013-04-10 DIAGNOSIS — H6120 Impacted cerumen, unspecified ear: Secondary | ICD-10-CM | POA: Diagnosis not present

## 2013-04-10 DIAGNOSIS — R1310 Dysphagia, unspecified: Secondary | ICD-10-CM | POA: Diagnosis not present

## 2013-04-10 DIAGNOSIS — R41841 Cognitive communication deficit: Secondary | ICD-10-CM | POA: Diagnosis not present

## 2013-04-10 DIAGNOSIS — M6281 Muscle weakness (generalized): Secondary | ICD-10-CM | POA: Diagnosis not present

## 2013-04-10 DIAGNOSIS — M545 Low back pain, unspecified: Secondary | ICD-10-CM | POA: Diagnosis not present

## 2013-04-10 MED ORDER — ESOMEPRAZOLE MAGNESIUM 20 MG PO CPDR: 20.0000 mg | DELAYED_RELEASE_CAPSULE | Freq: Every day | ORAL | Status: DC

## 2013-04-10 NOTE — Progress Notes (Signed)
Patient ID: Christine Pierce, female   DOB: Jun 12, 1921, 80 y.o.   MRN: [id number]    Allergies  Allergen Reactions  . Macrodantin [Nitrofurantoin Macrocrystal]     Chief Complaint  Patient presents with  . Cerumen Impaction    Patient c/o bilateral ear fullness    HPI: Patient is a 80 y.o. female seen in the office today for ears feeling clogs and fullness; question about cerumen impaction; was getting a high pitched tone and went to see about hearing aids and they looked in her ears and said they were impacted  Also more hoarse; was on nexium with Hx of GERD and questions if this is the cause; reports GERD worse   Has been on vesicare and reports this is not working; still having urinary urgency in the morning; takes medication in the morning  Review of Systems:  Review of Systems  Constitutional: Negative for fever, chills and malaise/fatigue.  HENT: Positive for hearing loss. Negative for ear discharge and ear pain.        Hoarse  Respiratory: Negative for cough and shortness of breath.   Cardiovascular: Negative for chest pain, palpitations and leg swelling.  Gastrointestinal: Positive for heartburn. Negative for abdominal pain, diarrhea and constipation.  Genitourinary: Positive for urgency and frequency. Negative for dysuria.  Neurological: Negative for weakness.     Past Medical History  Diagnosis Date  . Hypertension 10/23/2011  . Renal disorder   . Shortness of breath 04/30/2012  . Other nonspecific abnormal finding of lung field   . Contact with or exposure to unspecified communicable disease   . Cystocele, midline 04/10/2012  . Cough 04/10/2012  . Full incontinence of feces 04/10/2012  . Urgency of urination 04/10/2012  . Abdominal pain, other specified site   . Orthopnea 12/01/2011  . Type II or unspecified type diabetes mellitus without mention of complication, not stated as uncontrolled 11/16/2009  . Other and unspecified hyperlipidemia 10/20/2011  . Urinary  tract infection, site not specified 09/11/2011  . Muscle weakness (generalized) 09/11/2011  . Syncope and collapse 03/29/2011  . Pain in joint, ankle and foot 12/27/2010  . Sprain of ribs 08/15/2010  . Unspecified tinnitus 07/04/2010  . Pneumonia, organism unspecified 05/04/2010  . Diverticulosis of colon (without mention of hemorrhage)   . Other and unspecified ovarian cyst 05/04/2010  . Abdominal pain, right lower quadrant 05/02/2010  . Anxiety state, unspecified 02/14/2010  . Shortness of breath 01/05/2010  . Chronic kidney disease, stage III (moderate) 12/15/09  . Nausea alone 12/13/2009  . Dehydration   . Acute kidney failure, unspecified 12/10/2009  . Pain in joint, shoulder region 11/15/2009  . Other abnormal blood chemistry   . Pain in joint, pelvic region and thigh 07/29/2009  . Personal history of fall 07/29/2009  . Other malaise and fatigue 02/15/2009  . Obesity, unspecified 07/16/2008  . Hydronephrosis 07/16/2008  . Unspecified hereditary and idiopathic peripheral neuropathy   . Reflux esophagitis 10/18/2007  . Unspecified vitamin D deficiency 07/19/2007  . Chest pain, unspecified 07/19/2007  . Varicose veins of lower extremities with inflammation 04/25/2006  . Restless legs syndrome (RLS) 01/16/2006  . Nocturia 07/07/2005  . Encounter for long-term (current) use of other medications   . Cervicalgia 01/20/2004  . Osteoporosis, unspecified 01/23/2003  . Sciatica 04/03/2001  . Routine general medical examination at a health care facility 11/15/2009  . Pain in joint, lower leg 12/11/2001  . Lumbago 02/20/2001   Past Surgical History  Procedure Laterality Date  . Tonsillectomy  and adenoidectomy    . Appendectomy    . Stapedectomy Bilateral [phone]    middle ear surgery  . Eye surgery Left 1988    cataract w IOL implant, Dr. Vic Ripper  . Fracture surgery Right 09/2004    distal radius/Dr. Teressa Senter   Social History:   reports that she has never smoked. She does  not have any smokeless tobacco history on file. She reports that she does not drink alcohol or use illicit drugs.  Family History  Problem Relation Age of Onset  . Cancer Mother     abdomen  . Cancer Father     brain, lung  . Heart disease Son     Medications: Patient's Medications  New Prescriptions   No medications on file  Previous Medications   ACETAMINOPHEN (TYLENOL) 325 MG TABLET    Take 650 mg by mouth every 4 (four) hours as needed for pain.   ALBUTEROL (PROVENTIL HFA;VENTOLIN HFA) 108 (90 BASE) MCG/ACT INHALER    Inhale 2 puffs into the lungs every 6 (six) hours as needed for wheezing.   ASPIRIN EC 81 MG TABLET    Take 81 mg by mouth daily. Take 1 tablet daily to prevent heart attack, stroke and blood clot.   FLUTICASONE-SALMETEROL (ADVAIR) 100-50 MCG/DOSE AEPB    Inhale once every 12 hours to help breathing   LOSARTAN-HYDROCHLOROTHIAZIDE (HYZAAR) 50-12.5 MG PER TABLET    Take 1 tablet by mouth daily. Take one tablet daily for blood pressure.   LOVASTATIN (MEVACOR) 40 MG TABLET    TAKE 1 TABLET DAILY TO LOWER CHOLESTEROL   MISC. DEVICES (ROLLER WALKER) MISC    Rolling Walker with seat and brakes.   MULTIPLE VITAMINS-MINERALS (MULTIVITAMIN WITH MINERALS) TABLET    Take 1 tablet by mouth daily. Take 1 tablet daily for vitamin supplement.   NITROGLYCERIN (NITROSTAT) 0.4 MG SL TABLET    Place 0.4 mg under the tongue every 5 (five) minutes as needed for chest pain.   SOLIFENACIN (VESICARE) 5 MG TABLET    Take 1 tablet (5 mg total) by mouth daily.   TIOTROPIUM (SPIRIVA HANDIHALER) 18 MCG INHALATION CAPSULE    Place 1 capsule (18 mcg total) into inhaler and inhale daily.   TRAMADOL (ULTRAM) 50 MG TABLET    One every 4 hours if needed for pain   TRIAMCINOLONE CREAM (KENALOG) 0.1 %    Apply 1 application topically 2 (two) times daily. Apply daily to crusty lesions.  Modified Medications   No medications on file  Discontinued Medications   No medications on file     Physical  Exam:  Filed Vitals:   04/10/13 1431  BP: 118/70  Pulse: 89  Temp: 97 F (36.1 C)  TempSrc: Oral  Weight: 191 lb (86.637 kg)  SpO2: 96%    Physical Exam  Constitutional: She is well-developed, well-nourished, and in no distress.  HENT:  Head: Normocephalic and atraumatic.  Right Ear: External ear normal.  Left Ear: External ear normal.  Nose: Nose normal.  Mouth/Throat: Oropharynx is clear and moist. No oropharyngeal exudate.  Cerumen impaction bilaterally  Eyes: Conjunctivae and EOM are normal. Pupils are equal, round, and reactive to light.  Neck: Normal range of motion. Neck supple. No thyromegaly present.  Cardiovascular: Normal rate, regular rhythm and normal heart sounds.   Pulmonary/Chest: Effort normal and breath sounds normal. No respiratory distress.  Abdominal: Soft. Bowel sounds are normal. She exhibits no distension.  Musculoskeletal: She exhibits no edema and no tenderness.  Neurological: She  is alert.  Skin: Skin is warm and dry. No erythema.  Psychiatric: Affect normal.     Labs reviewed: Basic Metabolic Panel:  Recent Labs  95/62/1301/21/15 1706  NA 142  K 4.9  CL 104  CO2 22  GLUCOSE 99  BUN 29*  CREATININE 1.31*  CALCIUM 9.8  TSH 1.150   Liver Function Tests:  Recent Labs  03/26/13 1706  AST 17  ALT 8  ALKPHOS 73  BILITOT 0.2  PROT 6.1   No results found for this basename: LIPASE, AMYLASE,  in the last 8760 hours No results found for this basename: AMMONIA,  in the last 8760 hours CBC:  Recent Labs  03/26/13 1706  WBC 4.4  NEUTROABS 2.5  HGB 11.3  HCT 34.4  MCV 92  PLT 256   Lipid Panel: No results found for this basename: CHOL, HDL, LDLCALC, TRIG, CHOLHDL, LDLDIRECT,  in the last 8760 hours TSH:  Recent Labs  03/26/13 1706  TSH 1.150   A1C: Lab Results  Component Value Date   HGBA1C 6.0* 03/26/2013     Assessment/Plan  1. COPD (chronic obstructive pulmonary disease) -improved on spiriva, conts on advair -reports  activity tolerance is better  2. Cerumen impaction -cerumen removed; pt tolerated well   3. GERD (gastroesophageal reflux disease) -with hoariness -will restart - esomeprazole (NEXIUM) 20 MG capsule; Take 1 capsule (20 mg total) by mouth daily at 12 noon.  Dispense: 30 capsule; Refill: 3  4. Overactive bladder -will have facility change the time of vesicare to night time to see if this helps  To keep follow up with Dr Chilton SiGreen

## 2013-04-10 NOTE — Patient Instructions (Signed)
Increase water intake  CHANGE time of Vesicare to bedtime  Will START nexium

## 2013-04-14 DIAGNOSIS — M545 Low back pain, unspecified: Secondary | ICD-10-CM | POA: Diagnosis not present

## 2013-04-14 DIAGNOSIS — R262 Difficulty in walking, not elsewhere classified: Secondary | ICD-10-CM | POA: Diagnosis not present

## 2013-04-14 DIAGNOSIS — R1310 Dysphagia, unspecified: Secondary | ICD-10-CM | POA: Diagnosis not present

## 2013-04-14 DIAGNOSIS — R41841 Cognitive communication deficit: Secondary | ICD-10-CM | POA: Diagnosis not present

## 2013-04-14 DIAGNOSIS — M6281 Muscle weakness (generalized): Secondary | ICD-10-CM | POA: Diagnosis not present

## 2013-04-14 DIAGNOSIS — R279 Unspecified lack of coordination: Secondary | ICD-10-CM | POA: Diagnosis not present

## 2013-04-15 DIAGNOSIS — M545 Low back pain, unspecified: Secondary | ICD-10-CM | POA: Diagnosis not present

## 2013-04-15 DIAGNOSIS — R1310 Dysphagia, unspecified: Secondary | ICD-10-CM | POA: Diagnosis not present

## 2013-04-15 DIAGNOSIS — M6281 Muscle weakness (generalized): Secondary | ICD-10-CM | POA: Diagnosis not present

## 2013-04-15 DIAGNOSIS — R262 Difficulty in walking, not elsewhere classified: Secondary | ICD-10-CM | POA: Diagnosis not present

## 2013-04-15 DIAGNOSIS — R41841 Cognitive communication deficit: Secondary | ICD-10-CM | POA: Diagnosis not present

## 2013-04-15 DIAGNOSIS — R279 Unspecified lack of coordination: Secondary | ICD-10-CM | POA: Diagnosis not present

## 2013-04-17 ENCOUNTER — Ambulatory Visit (INDEPENDENT_AMBULATORY_CARE_PROVIDER_SITE_OTHER): Payer: Medicare Other | Admitting: Podiatry

## 2013-04-17 ENCOUNTER — Encounter: Payer: Self-pay | Admitting: Podiatry

## 2013-04-17 DIAGNOSIS — M6281 Muscle weakness (generalized): Secondary | ICD-10-CM | POA: Diagnosis not present

## 2013-04-17 DIAGNOSIS — R1310 Dysphagia, unspecified: Secondary | ICD-10-CM | POA: Diagnosis not present

## 2013-04-17 DIAGNOSIS — R279 Unspecified lack of coordination: Secondary | ICD-10-CM | POA: Diagnosis not present

## 2013-04-17 DIAGNOSIS — M545 Low back pain, unspecified: Secondary | ICD-10-CM | POA: Diagnosis not present

## 2013-04-17 DIAGNOSIS — R262 Difficulty in walking, not elsewhere classified: Secondary | ICD-10-CM | POA: Diagnosis not present

## 2013-04-17 DIAGNOSIS — R41841 Cognitive communication deficit: Secondary | ICD-10-CM | POA: Diagnosis not present

## 2013-04-17 DIAGNOSIS — M79609 Pain in unspecified limb: Secondary | ICD-10-CM

## 2013-04-17 DIAGNOSIS — B351 Tinea unguium: Secondary | ICD-10-CM | POA: Diagnosis not present

## 2013-04-17 NOTE — Patient Instructions (Signed)
Diabetes and Foot Care Diabetes may cause you to have problems because of poor blood supply (circulation) to your feet and legs. This may cause the skin on your feet to become thinner, break easier, and heal more slowly. Your skin may become dry, and the skin may peel and crack. You may also have nerve damage in your legs and feet causing decreased feeling in them. You may not notice minor injuries to your feet that could lead to infections or more serious problems. Taking care of your feet is one of the most important things you can do for yourself.  HOME CARE INSTRUCTIONS  Wear shoes at all times, even in the house. Do not go barefoot. Bare feet are easily injured.  Check your feet daily for blisters, cuts, and redness. If you cannot see the bottom of your feet, use a mirror or ask someone for help.  Wash your feet with warm water (do not use hot water) and mild soap. Then pat your feet and the areas between your toes until they are completely dry. Do not soak your feet as this can dry your skin.  Apply a moisturizing lotion or petroleum jelly (that does not contain alcohol and is unscented) to the skin on your feet and to dry, brittle toenails. Do not apply lotion between your toes.  Trim your toenails straight across. Do not dig under them or around the cuticle. File the edges of your nails with an emery board or nail file.  Do not cut corns or calluses or try to remove them with medicine.  Wear clean socks or stockings every day. Make sure they are not too tight. Do not wear knee-high stockings since they may decrease blood flow to your legs.  Wear shoes that fit properly and have enough cushioning. To break in new shoes, wear them for just a few hours a day. This prevents you from injuring your feet. Always look in your shoes before you put them on to be sure there are no objects inside.  Do not cross your legs. This may decrease the blood flow to your feet.  If you find a minor scrape,  cut, or break in the skin on your feet, keep it and the skin around it clean and dry. These areas may be cleansed with mild soap and water. Do not cleanse the area with peroxide, alcohol, or iodine.  When you remove an adhesive bandage, be sure not to damage the skin around it.  If you have a wound, look at it several times a day to make sure it is healing.  Do not use heating pads or hot water bottles. They may burn your skin. If you have lost feeling in your feet or legs, you may not know it is happening until it is too late.  Make sure your health care provider performs a complete foot exam at least annually or more often if you have foot problems. Report any cuts, sores, or bruises to your health care provider immediately. SEEK MEDICAL CARE IF:   You have an injury that is not healing.  You have cuts or breaks in the skin.  You have an ingrown nail.  You notice redness on your legs or feet.  You feel burning or tingling in your legs or feet.  You have pain or cramps in your legs and feet.  Your legs or feet are numb.  Your feet always feel cold. SEEK IMMEDIATE MEDICAL CARE IF:   There is increasing redness,   swelling, or pain in or around a wound.  There is a red line that goes up your leg.  Pus is coming from a wound.  You develop a fever or as directed by your health care provider.  You notice a bad smell coming from an ulcer or wound. Document Released: 02/18/2000 Document Revised: 10/23/2012 Document Reviewed: 07/30/2012 [employer] Patient Information 2014 [employer]  

## 2013-04-18 DIAGNOSIS — M545 Low back pain, unspecified: Secondary | ICD-10-CM | POA: Diagnosis not present

## 2013-04-18 DIAGNOSIS — R1310 Dysphagia, unspecified: Secondary | ICD-10-CM | POA: Diagnosis not present

## 2013-04-18 DIAGNOSIS — R41841 Cognitive communication deficit: Secondary | ICD-10-CM | POA: Diagnosis not present

## 2013-04-18 DIAGNOSIS — M6281 Muscle weakness (generalized): Secondary | ICD-10-CM | POA: Diagnosis not present

## 2013-04-18 DIAGNOSIS — R262 Difficulty in walking, not elsewhere classified: Secondary | ICD-10-CM | POA: Diagnosis not present

## 2013-04-18 DIAGNOSIS — R279 Unspecified lack of coordination: Secondary | ICD-10-CM | POA: Diagnosis not present

## 2013-04-18 NOTE — Progress Notes (Signed)
Subjective:     Patient ID: Christine Pierce, female   DOB: 07-05-1921, 80 y.o.   MRN: [id number]  HPI patient presents with painful nailbeds 1-5 both feet that are thick and impossible for her to cut   Review of Systems     Objective:   Physical Exam Neurovascular status unchanged well oriented x3 with nail disease and thickness 1-5 both feet    Assessment:     Mycotic nail infection with pain 1-5 both feet    Plan:     Debridement of painful nail bed 1-5 both feet with no iatrogenic bleeding noted

## 2013-04-21 DIAGNOSIS — R1310 Dysphagia, unspecified: Secondary | ICD-10-CM | POA: Diagnosis not present

## 2013-04-21 DIAGNOSIS — R262 Difficulty in walking, not elsewhere classified: Secondary | ICD-10-CM | POA: Diagnosis not present

## 2013-04-21 DIAGNOSIS — M545 Low back pain, unspecified: Secondary | ICD-10-CM | POA: Diagnosis not present

## 2013-04-21 DIAGNOSIS — M6281 Muscle weakness (generalized): Secondary | ICD-10-CM | POA: Diagnosis not present

## 2013-04-21 DIAGNOSIS — R279 Unspecified lack of coordination: Secondary | ICD-10-CM | POA: Diagnosis not present

## 2013-04-21 DIAGNOSIS — R41841 Cognitive communication deficit: Secondary | ICD-10-CM | POA: Diagnosis not present

## 2013-04-22 ENCOUNTER — Ambulatory Visit: Payer: Medicare Other | Admitting: Internal Medicine

## 2013-04-23 DIAGNOSIS — R279 Unspecified lack of coordination: Secondary | ICD-10-CM | POA: Diagnosis not present

## 2013-04-23 DIAGNOSIS — R262 Difficulty in walking, not elsewhere classified: Secondary | ICD-10-CM | POA: Diagnosis not present

## 2013-04-23 DIAGNOSIS — M545 Low back pain, unspecified: Secondary | ICD-10-CM | POA: Diagnosis not present

## 2013-04-23 DIAGNOSIS — R41841 Cognitive communication deficit: Secondary | ICD-10-CM | POA: Diagnosis not present

## 2013-04-23 DIAGNOSIS — M6281 Muscle weakness (generalized): Secondary | ICD-10-CM | POA: Diagnosis not present

## 2013-04-23 DIAGNOSIS — R1310 Dysphagia, unspecified: Secondary | ICD-10-CM | POA: Diagnosis not present

## 2013-04-24 DIAGNOSIS — M6281 Muscle weakness (generalized): Secondary | ICD-10-CM | POA: Diagnosis not present

## 2013-04-24 DIAGNOSIS — R279 Unspecified lack of coordination: Secondary | ICD-10-CM | POA: Diagnosis not present

## 2013-04-24 DIAGNOSIS — R262 Difficulty in walking, not elsewhere classified: Secondary | ICD-10-CM | POA: Diagnosis not present

## 2013-04-24 DIAGNOSIS — R1310 Dysphagia, unspecified: Secondary | ICD-10-CM | POA: Diagnosis not present

## 2013-04-24 DIAGNOSIS — R41841 Cognitive communication deficit: Secondary | ICD-10-CM | POA: Diagnosis not present

## 2013-04-24 DIAGNOSIS — M545 Low back pain, unspecified: Secondary | ICD-10-CM | POA: Diagnosis not present

## 2013-04-25 ENCOUNTER — Telehealth: Payer: Self-pay | Admitting: *Deleted

## 2013-04-25 DIAGNOSIS — R279 Unspecified lack of coordination: Secondary | ICD-10-CM | POA: Diagnosis not present

## 2013-04-25 DIAGNOSIS — M545 Low back pain, unspecified: Secondary | ICD-10-CM | POA: Diagnosis not present

## 2013-04-25 DIAGNOSIS — R1310 Dysphagia, unspecified: Secondary | ICD-10-CM | POA: Diagnosis not present

## 2013-04-25 DIAGNOSIS — R262 Difficulty in walking, not elsewhere classified: Secondary | ICD-10-CM | POA: Diagnosis not present

## 2013-04-25 DIAGNOSIS — M6281 Muscle weakness (generalized): Secondary | ICD-10-CM | POA: Diagnosis not present

## 2013-04-25 DIAGNOSIS — R41841 Cognitive communication deficit: Secondary | ICD-10-CM | POA: Diagnosis not present

## 2013-04-25 NOTE — Telephone Encounter (Signed)
Patient POA, Mordecai Maesherese, called and stated that she wanted to come in and talk with Dr. Chilton SiGreen before patient's appointment. Called and told her that our protocol is to type up patients concerns and we will give to Dr. Chilton SiGreen before he goes into the room with patient. She agreed and will type up.

## 2013-04-29 DIAGNOSIS — M545 Low back pain, unspecified: Secondary | ICD-10-CM | POA: Diagnosis not present

## 2013-04-29 DIAGNOSIS — R41841 Cognitive communication deficit: Secondary | ICD-10-CM | POA: Diagnosis not present

## 2013-04-29 DIAGNOSIS — R1310 Dysphagia, unspecified: Secondary | ICD-10-CM | POA: Diagnosis not present

## 2013-04-29 DIAGNOSIS — R279 Unspecified lack of coordination: Secondary | ICD-10-CM | POA: Diagnosis not present

## 2013-04-29 DIAGNOSIS — R262 Difficulty in walking, not elsewhere classified: Secondary | ICD-10-CM | POA: Diagnosis not present

## 2013-04-29 DIAGNOSIS — M6281 Muscle weakness (generalized): Secondary | ICD-10-CM | POA: Diagnosis not present

## 2013-04-30 ENCOUNTER — Ambulatory Visit (INDEPENDENT_AMBULATORY_CARE_PROVIDER_SITE_OTHER): Payer: Medicare Other | Admitting: Internal Medicine

## 2013-04-30 ENCOUNTER — Encounter: Payer: Self-pay | Admitting: Internal Medicine

## 2013-04-30 VITALS — BP 160/86 | HR 87 | Temp 97.4°F | Resp 20 | Ht 64.5 in | Wt 193.6 lb

## 2013-04-30 DIAGNOSIS — I10 Essential (primary) hypertension: Secondary | ICD-10-CM

## 2013-04-30 DIAGNOSIS — R413 Other amnesia: Secondary | ICD-10-CM

## 2013-04-30 DIAGNOSIS — M545 Low back pain, unspecified: Secondary | ICD-10-CM | POA: Diagnosis not present

## 2013-04-30 DIAGNOSIS — R279 Unspecified lack of coordination: Secondary | ICD-10-CM | POA: Diagnosis not present

## 2013-04-30 DIAGNOSIS — M25559 Pain in unspecified hip: Secondary | ICD-10-CM

## 2013-04-30 DIAGNOSIS — R0602 Shortness of breath: Secondary | ICD-10-CM

## 2013-04-30 DIAGNOSIS — R262 Difficulty in walking, not elsewhere classified: Secondary | ICD-10-CM | POA: Diagnosis not present

## 2013-04-30 DIAGNOSIS — F429 Obsessive-compulsive disorder, unspecified: Secondary | ICD-10-CM

## 2013-04-30 DIAGNOSIS — R269 Unspecified abnormalities of gait and mobility: Secondary | ICD-10-CM

## 2013-04-30 DIAGNOSIS — J449 Chronic obstructive pulmonary disease, unspecified: Secondary | ICD-10-CM | POA: Diagnosis not present

## 2013-04-30 DIAGNOSIS — R1310 Dysphagia, unspecified: Secondary | ICD-10-CM | POA: Diagnosis not present

## 2013-04-30 DIAGNOSIS — E119 Type 2 diabetes mellitus without complications: Secondary | ICD-10-CM | POA: Diagnosis not present

## 2013-04-30 DIAGNOSIS — M6281 Muscle weakness (generalized): Secondary | ICD-10-CM | POA: Diagnosis not present

## 2013-04-30 DIAGNOSIS — R41841 Cognitive communication deficit: Secondary | ICD-10-CM | POA: Diagnosis not present

## 2013-04-30 DIAGNOSIS — E785 Hyperlipidemia, unspecified: Secondary | ICD-10-CM

## 2013-04-30 DIAGNOSIS — R32 Unspecified urinary incontinence: Secondary | ICD-10-CM

## 2013-04-30 DIAGNOSIS — F423 Hoarding disorder: Secondary | ICD-10-CM

## 2013-04-30 MED ORDER — SOLIFENACIN SUCCINATE 10 MG PO TABS: ORAL_TABLET | ORAL | Status: DC

## 2013-04-30 NOTE — Progress Notes (Signed)
Passed clock drawing 

## 2013-04-30 NOTE — Patient Instructions (Signed)
Continue medication as listed below.
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
## 2013-05-01 DIAGNOSIS — M6281 Muscle weakness (generalized): Secondary | ICD-10-CM | POA: Diagnosis not present

## 2013-05-01 DIAGNOSIS — R1310 Dysphagia, unspecified: Secondary | ICD-10-CM | POA: Diagnosis not present

## 2013-05-01 DIAGNOSIS — M545 Low back pain, unspecified: Secondary | ICD-10-CM | POA: Diagnosis not present

## 2013-05-01 DIAGNOSIS — R262 Difficulty in walking, not elsewhere classified: Secondary | ICD-10-CM | POA: Diagnosis not present

## 2013-05-01 DIAGNOSIS — R41841 Cognitive communication deficit: Secondary | ICD-10-CM | POA: Diagnosis not present

## 2013-05-01 DIAGNOSIS — R279 Unspecified lack of coordination: Secondary | ICD-10-CM | POA: Diagnosis not present

## 2013-05-02 DIAGNOSIS — R279 Unspecified lack of coordination: Secondary | ICD-10-CM | POA: Diagnosis not present

## 2013-05-02 DIAGNOSIS — M545 Low back pain, unspecified: Secondary | ICD-10-CM | POA: Diagnosis not present

## 2013-05-02 DIAGNOSIS — R1310 Dysphagia, unspecified: Secondary | ICD-10-CM | POA: Diagnosis not present

## 2013-05-02 DIAGNOSIS — M6281 Muscle weakness (generalized): Secondary | ICD-10-CM | POA: Diagnosis not present

## 2013-05-02 DIAGNOSIS — R262 Difficulty in walking, not elsewhere classified: Secondary | ICD-10-CM | POA: Diagnosis not present

## 2013-05-02 DIAGNOSIS — R41841 Cognitive communication deficit: Secondary | ICD-10-CM | POA: Diagnosis not present

## 2013-05-07 DIAGNOSIS — R1310 Dysphagia, unspecified: Secondary | ICD-10-CM | POA: Diagnosis not present

## 2013-05-07 DIAGNOSIS — R0609 Other forms of dyspnea: Secondary | ICD-10-CM | POA: Diagnosis not present

## 2013-05-07 DIAGNOSIS — E119 Type 2 diabetes mellitus without complications: Secondary | ICD-10-CM | POA: Diagnosis not present

## 2013-05-07 DIAGNOSIS — R279 Unspecified lack of coordination: Secondary | ICD-10-CM | POA: Diagnosis not present

## 2013-05-07 DIAGNOSIS — M6281 Muscle weakness (generalized): Secondary | ICD-10-CM | POA: Diagnosis not present

## 2013-05-07 DIAGNOSIS — M47817 Spondylosis without myelopathy or radiculopathy, lumbosacral region: Secondary | ICD-10-CM | POA: Diagnosis not present

## 2013-05-07 DIAGNOSIS — R262 Difficulty in walking, not elsewhere classified: Secondary | ICD-10-CM | POA: Diagnosis not present

## 2013-05-07 DIAGNOSIS — M545 Low back pain, unspecified: Secondary | ICD-10-CM | POA: Diagnosis not present

## 2013-05-07 DIAGNOSIS — R41841 Cognitive communication deficit: Secondary | ICD-10-CM | POA: Diagnosis not present

## 2013-05-08 DIAGNOSIS — R279 Unspecified lack of coordination: Secondary | ICD-10-CM | POA: Diagnosis not present

## 2013-05-08 DIAGNOSIS — M545 Low back pain, unspecified: Secondary | ICD-10-CM | POA: Diagnosis not present

## 2013-05-08 DIAGNOSIS — R41841 Cognitive communication deficit: Secondary | ICD-10-CM | POA: Diagnosis not present

## 2013-05-08 DIAGNOSIS — F423 Hoarding disorder: Secondary | ICD-10-CM

## 2013-05-08 DIAGNOSIS — R1310 Dysphagia, unspecified: Secondary | ICD-10-CM | POA: Diagnosis not present

## 2013-05-08 DIAGNOSIS — M6281 Muscle weakness (generalized): Secondary | ICD-10-CM | POA: Diagnosis not present

## 2013-05-08 DIAGNOSIS — R413 Other amnesia: Secondary | ICD-10-CM

## 2013-05-08 DIAGNOSIS — R262 Difficulty in walking, not elsewhere classified: Secondary | ICD-10-CM | POA: Diagnosis not present

## 2013-05-08 HISTORY — DX: Hoarding disorder: F42.3

## 2013-05-08 HISTORY — DX: Other amnesia: R41.3

## 2013-05-09 DIAGNOSIS — R41841 Cognitive communication deficit: Secondary | ICD-10-CM | POA: Diagnosis not present

## 2013-05-09 DIAGNOSIS — R279 Unspecified lack of coordination: Secondary | ICD-10-CM | POA: Diagnosis not present

## 2013-05-09 DIAGNOSIS — M6281 Muscle weakness (generalized): Secondary | ICD-10-CM | POA: Diagnosis not present

## 2013-05-09 DIAGNOSIS — R1310 Dysphagia, unspecified: Secondary | ICD-10-CM | POA: Diagnosis not present

## 2013-05-09 DIAGNOSIS — M545 Low back pain, unspecified: Secondary | ICD-10-CM | POA: Diagnosis not present

## 2013-05-09 DIAGNOSIS — R262 Difficulty in walking, not elsewhere classified: Secondary | ICD-10-CM | POA: Diagnosis not present

## 2013-05-12 DIAGNOSIS — R279 Unspecified lack of coordination: Secondary | ICD-10-CM | POA: Diagnosis not present

## 2013-05-12 DIAGNOSIS — M545 Low back pain, unspecified: Secondary | ICD-10-CM | POA: Diagnosis not present

## 2013-05-12 DIAGNOSIS — R41841 Cognitive communication deficit: Secondary | ICD-10-CM | POA: Diagnosis not present

## 2013-05-12 DIAGNOSIS — R262 Difficulty in walking, not elsewhere classified: Secondary | ICD-10-CM | POA: Diagnosis not present

## 2013-05-12 DIAGNOSIS — R1310 Dysphagia, unspecified: Secondary | ICD-10-CM | POA: Diagnosis not present

## 2013-05-12 DIAGNOSIS — M6281 Muscle weakness (generalized): Secondary | ICD-10-CM | POA: Diagnosis not present

## 2013-05-13 DIAGNOSIS — R262 Difficulty in walking, not elsewhere classified: Secondary | ICD-10-CM | POA: Diagnosis not present

## 2013-05-13 DIAGNOSIS — R41841 Cognitive communication deficit: Secondary | ICD-10-CM | POA: Diagnosis not present

## 2013-05-13 DIAGNOSIS — M6281 Muscle weakness (generalized): Secondary | ICD-10-CM | POA: Diagnosis not present

## 2013-05-13 DIAGNOSIS — M545 Low back pain, unspecified: Secondary | ICD-10-CM | POA: Diagnosis not present

## 2013-05-13 DIAGNOSIS — R279 Unspecified lack of coordination: Secondary | ICD-10-CM | POA: Diagnosis not present

## 2013-05-13 DIAGNOSIS — R1310 Dysphagia, unspecified: Secondary | ICD-10-CM | POA: Diagnosis not present

## 2013-05-14 DIAGNOSIS — R279 Unspecified lack of coordination: Secondary | ICD-10-CM | POA: Diagnosis not present

## 2013-05-14 DIAGNOSIS — R1310 Dysphagia, unspecified: Secondary | ICD-10-CM | POA: Diagnosis not present

## 2013-05-14 DIAGNOSIS — M6281 Muscle weakness (generalized): Secondary | ICD-10-CM | POA: Diagnosis not present

## 2013-05-14 DIAGNOSIS — M545 Low back pain, unspecified: Secondary | ICD-10-CM | POA: Diagnosis not present

## 2013-05-14 DIAGNOSIS — R41841 Cognitive communication deficit: Secondary | ICD-10-CM | POA: Diagnosis not present

## 2013-05-14 DIAGNOSIS — R262 Difficulty in walking, not elsewhere classified: Secondary | ICD-10-CM | POA: Diagnosis not present

## 2013-05-15 DIAGNOSIS — R41841 Cognitive communication deficit: Secondary | ICD-10-CM | POA: Diagnosis not present

## 2013-05-15 DIAGNOSIS — R1310 Dysphagia, unspecified: Secondary | ICD-10-CM | POA: Diagnosis not present

## 2013-05-15 DIAGNOSIS — M545 Low back pain, unspecified: Secondary | ICD-10-CM | POA: Diagnosis not present

## 2013-05-15 DIAGNOSIS — M6281 Muscle weakness (generalized): Secondary | ICD-10-CM | POA: Diagnosis not present

## 2013-05-15 DIAGNOSIS — R262 Difficulty in walking, not elsewhere classified: Secondary | ICD-10-CM | POA: Diagnosis not present

## 2013-05-15 DIAGNOSIS — R279 Unspecified lack of coordination: Secondary | ICD-10-CM | POA: Diagnosis not present

## 2013-05-16 DIAGNOSIS — N390 Urinary tract infection, site not specified: Secondary | ICD-10-CM | POA: Diagnosis not present

## 2013-05-16 DIAGNOSIS — N183 Chronic kidney disease, stage 3 unspecified: Secondary | ICD-10-CM | POA: Diagnosis not present

## 2013-05-16 DIAGNOSIS — I129 Hypertensive chronic kidney disease with stage 1 through stage 4 chronic kidney disease, or unspecified chronic kidney disease: Secondary | ICD-10-CM | POA: Diagnosis not present

## 2013-05-16 DIAGNOSIS — D631 Anemia in chronic kidney disease: Secondary | ICD-10-CM | POA: Diagnosis not present

## 2013-05-16 DIAGNOSIS — N2581 Secondary hyperparathyroidism of renal origin: Secondary | ICD-10-CM | POA: Diagnosis not present

## 2013-05-16 DIAGNOSIS — N039 Chronic nephritic syndrome with unspecified morphologic changes: Secondary | ICD-10-CM | POA: Diagnosis not present

## 2013-05-20 DIAGNOSIS — M545 Low back pain, unspecified: Secondary | ICD-10-CM | POA: Diagnosis not present

## 2013-05-20 DIAGNOSIS — R279 Unspecified lack of coordination: Secondary | ICD-10-CM | POA: Diagnosis not present

## 2013-05-20 DIAGNOSIS — R262 Difficulty in walking, not elsewhere classified: Secondary | ICD-10-CM | POA: Diagnosis not present

## 2013-05-20 DIAGNOSIS — M6281 Muscle weakness (generalized): Secondary | ICD-10-CM | POA: Diagnosis not present

## 2013-05-20 DIAGNOSIS — R41841 Cognitive communication deficit: Secondary | ICD-10-CM | POA: Diagnosis not present

## 2013-05-20 DIAGNOSIS — R1310 Dysphagia, unspecified: Secondary | ICD-10-CM | POA: Diagnosis not present

## 2013-05-21 DIAGNOSIS — R1310 Dysphagia, unspecified: Secondary | ICD-10-CM | POA: Diagnosis not present

## 2013-05-21 DIAGNOSIS — R41841 Cognitive communication deficit: Secondary | ICD-10-CM | POA: Diagnosis not present

## 2013-05-21 DIAGNOSIS — R262 Difficulty in walking, not elsewhere classified: Secondary | ICD-10-CM | POA: Diagnosis not present

## 2013-05-21 DIAGNOSIS — M545 Low back pain, unspecified: Secondary | ICD-10-CM | POA: Diagnosis not present

## 2013-05-21 DIAGNOSIS — M6281 Muscle weakness (generalized): Secondary | ICD-10-CM | POA: Diagnosis not present

## 2013-05-21 DIAGNOSIS — R279 Unspecified lack of coordination: Secondary | ICD-10-CM | POA: Diagnosis not present

## 2013-05-22 DIAGNOSIS — R41841 Cognitive communication deficit: Secondary | ICD-10-CM | POA: Diagnosis not present

## 2013-05-22 DIAGNOSIS — R279 Unspecified lack of coordination: Secondary | ICD-10-CM | POA: Diagnosis not present

## 2013-05-22 DIAGNOSIS — R262 Difficulty in walking, not elsewhere classified: Secondary | ICD-10-CM | POA: Diagnosis not present

## 2013-05-22 DIAGNOSIS — M545 Low back pain, unspecified: Secondary | ICD-10-CM | POA: Diagnosis not present

## 2013-05-22 DIAGNOSIS — M6281 Muscle weakness (generalized): Secondary | ICD-10-CM | POA: Diagnosis not present

## 2013-05-22 DIAGNOSIS — R1310 Dysphagia, unspecified: Secondary | ICD-10-CM | POA: Diagnosis not present

## 2013-05-27 DIAGNOSIS — R1310 Dysphagia, unspecified: Secondary | ICD-10-CM | POA: Diagnosis not present

## 2013-05-27 DIAGNOSIS — R41841 Cognitive communication deficit: Secondary | ICD-10-CM | POA: Diagnosis not present

## 2013-05-27 DIAGNOSIS — M545 Low back pain, unspecified: Secondary | ICD-10-CM | POA: Diagnosis not present

## 2013-05-27 DIAGNOSIS — M6281 Muscle weakness (generalized): Secondary | ICD-10-CM | POA: Diagnosis not present

## 2013-05-27 DIAGNOSIS — R262 Difficulty in walking, not elsewhere classified: Secondary | ICD-10-CM | POA: Diagnosis not present

## 2013-05-27 DIAGNOSIS — R279 Unspecified lack of coordination: Secondary | ICD-10-CM | POA: Diagnosis not present

## 2013-05-28 DIAGNOSIS — M6281 Muscle weakness (generalized): Secondary | ICD-10-CM | POA: Diagnosis not present

## 2013-05-28 DIAGNOSIS — M545 Low back pain, unspecified: Secondary | ICD-10-CM | POA: Diagnosis not present

## 2013-05-28 DIAGNOSIS — R262 Difficulty in walking, not elsewhere classified: Secondary | ICD-10-CM | POA: Diagnosis not present

## 2013-05-28 DIAGNOSIS — R1310 Dysphagia, unspecified: Secondary | ICD-10-CM | POA: Diagnosis not present

## 2013-05-28 DIAGNOSIS — R41841 Cognitive communication deficit: Secondary | ICD-10-CM | POA: Diagnosis not present

## 2013-05-28 DIAGNOSIS — R279 Unspecified lack of coordination: Secondary | ICD-10-CM | POA: Diagnosis not present

## 2013-06-03 DIAGNOSIS — M545 Low back pain, unspecified: Secondary | ICD-10-CM | POA: Diagnosis not present

## 2013-06-03 DIAGNOSIS — R279 Unspecified lack of coordination: Secondary | ICD-10-CM | POA: Diagnosis not present

## 2013-06-03 DIAGNOSIS — M6281 Muscle weakness (generalized): Secondary | ICD-10-CM | POA: Diagnosis not present

## 2013-06-03 DIAGNOSIS — R262 Difficulty in walking, not elsewhere classified: Secondary | ICD-10-CM | POA: Diagnosis not present

## 2013-06-03 DIAGNOSIS — R1310 Dysphagia, unspecified: Secondary | ICD-10-CM | POA: Diagnosis not present

## 2013-06-03 DIAGNOSIS — R41841 Cognitive communication deficit: Secondary | ICD-10-CM | POA: Diagnosis not present

## 2013-06-05 DIAGNOSIS — M47817 Spondylosis without myelopathy or radiculopathy, lumbosacral region: Secondary | ICD-10-CM | POA: Diagnosis not present

## 2013-06-05 DIAGNOSIS — R262 Difficulty in walking, not elsewhere classified: Secondary | ICD-10-CM | POA: Diagnosis not present

## 2013-06-05 DIAGNOSIS — R279 Unspecified lack of coordination: Secondary | ICD-10-CM | POA: Diagnosis not present

## 2013-06-05 DIAGNOSIS — M545 Low back pain, unspecified: Secondary | ICD-10-CM | POA: Diagnosis not present

## 2013-06-05 DIAGNOSIS — R0609 Other forms of dyspnea: Secondary | ICD-10-CM | POA: Diagnosis not present

## 2013-06-05 DIAGNOSIS — R41841 Cognitive communication deficit: Secondary | ICD-10-CM | POA: Diagnosis not present

## 2013-06-05 DIAGNOSIS — R1310 Dysphagia, unspecified: Secondary | ICD-10-CM | POA: Diagnosis not present

## 2013-06-05 DIAGNOSIS — E119 Type 2 diabetes mellitus without complications: Secondary | ICD-10-CM | POA: Diagnosis not present

## 2013-06-10 DIAGNOSIS — M545 Low back pain, unspecified: Secondary | ICD-10-CM | POA: Diagnosis not present

## 2013-06-10 DIAGNOSIS — R41841 Cognitive communication deficit: Secondary | ICD-10-CM | POA: Diagnosis not present

## 2013-06-10 DIAGNOSIS — R1310 Dysphagia, unspecified: Secondary | ICD-10-CM | POA: Diagnosis not present

## 2013-06-10 DIAGNOSIS — R262 Difficulty in walking, not elsewhere classified: Secondary | ICD-10-CM | POA: Diagnosis not present

## 2013-06-10 DIAGNOSIS — E119 Type 2 diabetes mellitus without complications: Secondary | ICD-10-CM | POA: Diagnosis not present

## 2013-06-10 DIAGNOSIS — R279 Unspecified lack of coordination: Secondary | ICD-10-CM | POA: Diagnosis not present

## 2013-06-11 DIAGNOSIS — M545 Low back pain, unspecified: Secondary | ICD-10-CM | POA: Diagnosis not present

## 2013-06-11 DIAGNOSIS — R1310 Dysphagia, unspecified: Secondary | ICD-10-CM | POA: Diagnosis not present

## 2013-06-11 DIAGNOSIS — E119 Type 2 diabetes mellitus without complications: Secondary | ICD-10-CM | POA: Diagnosis not present

## 2013-06-11 DIAGNOSIS — R262 Difficulty in walking, not elsewhere classified: Secondary | ICD-10-CM | POA: Diagnosis not present

## 2013-06-11 DIAGNOSIS — R41841 Cognitive communication deficit: Secondary | ICD-10-CM | POA: Diagnosis not present

## 2013-06-11 DIAGNOSIS — R279 Unspecified lack of coordination: Secondary | ICD-10-CM | POA: Diagnosis not present

## 2013-06-18 DIAGNOSIS — R41841 Cognitive communication deficit: Secondary | ICD-10-CM | POA: Diagnosis not present

## 2013-06-18 DIAGNOSIS — R262 Difficulty in walking, not elsewhere classified: Secondary | ICD-10-CM | POA: Diagnosis not present

## 2013-06-18 DIAGNOSIS — M545 Low back pain, unspecified: Secondary | ICD-10-CM | POA: Diagnosis not present

## 2013-06-18 DIAGNOSIS — R1310 Dysphagia, unspecified: Secondary | ICD-10-CM | POA: Diagnosis not present

## 2013-06-18 DIAGNOSIS — E119 Type 2 diabetes mellitus without complications: Secondary | ICD-10-CM | POA: Diagnosis not present

## 2013-06-18 DIAGNOSIS — R279 Unspecified lack of coordination: Secondary | ICD-10-CM | POA: Diagnosis not present

## 2013-06-19 DIAGNOSIS — R262 Difficulty in walking, not elsewhere classified: Secondary | ICD-10-CM | POA: Diagnosis not present

## 2013-06-19 DIAGNOSIS — R279 Unspecified lack of coordination: Secondary | ICD-10-CM | POA: Diagnosis not present

## 2013-06-19 DIAGNOSIS — R41841 Cognitive communication deficit: Secondary | ICD-10-CM | POA: Diagnosis not present

## 2013-06-19 DIAGNOSIS — R1310 Dysphagia, unspecified: Secondary | ICD-10-CM | POA: Diagnosis not present

## 2013-06-19 DIAGNOSIS — E119 Type 2 diabetes mellitus without complications: Secondary | ICD-10-CM | POA: Diagnosis not present

## 2013-06-19 DIAGNOSIS — M545 Low back pain, unspecified: Secondary | ICD-10-CM | POA: Diagnosis not present

## 2013-06-23 DIAGNOSIS — Z1231 Encounter for screening mammogram for malignant neoplasm of breast: Secondary | ICD-10-CM | POA: Diagnosis not present

## 2013-06-25 DIAGNOSIS — R279 Unspecified lack of coordination: Secondary | ICD-10-CM | POA: Diagnosis not present

## 2013-06-25 DIAGNOSIS — E119 Type 2 diabetes mellitus without complications: Secondary | ICD-10-CM | POA: Diagnosis not present

## 2013-06-25 DIAGNOSIS — R1310 Dysphagia, unspecified: Secondary | ICD-10-CM | POA: Diagnosis not present

## 2013-06-25 DIAGNOSIS — R262 Difficulty in walking, not elsewhere classified: Secondary | ICD-10-CM | POA: Diagnosis not present

## 2013-06-25 DIAGNOSIS — R41841 Cognitive communication deficit: Secondary | ICD-10-CM | POA: Diagnosis not present

## 2013-06-25 DIAGNOSIS — M545 Low back pain, unspecified: Secondary | ICD-10-CM | POA: Diagnosis not present

## 2013-06-26 DIAGNOSIS — M545 Low back pain, unspecified: Secondary | ICD-10-CM | POA: Diagnosis not present

## 2013-06-26 DIAGNOSIS — R41841 Cognitive communication deficit: Secondary | ICD-10-CM | POA: Diagnosis not present

## 2013-06-26 DIAGNOSIS — R1310 Dysphagia, unspecified: Secondary | ICD-10-CM | POA: Diagnosis not present

## 2013-06-26 DIAGNOSIS — E119 Type 2 diabetes mellitus without complications: Secondary | ICD-10-CM | POA: Diagnosis not present

## 2013-06-26 DIAGNOSIS — R262 Difficulty in walking, not elsewhere classified: Secondary | ICD-10-CM | POA: Diagnosis not present

## 2013-06-26 DIAGNOSIS — R279 Unspecified lack of coordination: Secondary | ICD-10-CM | POA: Diagnosis not present

## 2013-06-27 DIAGNOSIS — M545 Low back pain, unspecified: Secondary | ICD-10-CM | POA: Diagnosis not present

## 2013-06-27 DIAGNOSIS — R262 Difficulty in walking, not elsewhere classified: Secondary | ICD-10-CM | POA: Diagnosis not present

## 2013-06-27 DIAGNOSIS — E119 Type 2 diabetes mellitus without complications: Secondary | ICD-10-CM | POA: Diagnosis not present

## 2013-06-27 DIAGNOSIS — R279 Unspecified lack of coordination: Secondary | ICD-10-CM | POA: Diagnosis not present

## 2013-06-27 DIAGNOSIS — R41841 Cognitive communication deficit: Secondary | ICD-10-CM | POA: Diagnosis not present

## 2013-06-27 DIAGNOSIS — R1310 Dysphagia, unspecified: Secondary | ICD-10-CM | POA: Diagnosis not present

## 2013-06-30 DIAGNOSIS — M545 Low back pain, unspecified: Secondary | ICD-10-CM | POA: Diagnosis not present

## 2013-06-30 DIAGNOSIS — R1310 Dysphagia, unspecified: Secondary | ICD-10-CM | POA: Diagnosis not present

## 2013-06-30 DIAGNOSIS — R279 Unspecified lack of coordination: Secondary | ICD-10-CM | POA: Diagnosis not present

## 2013-06-30 DIAGNOSIS — E119 Type 2 diabetes mellitus without complications: Secondary | ICD-10-CM | POA: Diagnosis not present

## 2013-06-30 DIAGNOSIS — R41841 Cognitive communication deficit: Secondary | ICD-10-CM | POA: Diagnosis not present

## 2013-06-30 DIAGNOSIS — R262 Difficulty in walking, not elsewhere classified: Secondary | ICD-10-CM | POA: Diagnosis not present

## 2013-07-01 DIAGNOSIS — R1310 Dysphagia, unspecified: Secondary | ICD-10-CM | POA: Diagnosis not present

## 2013-07-01 DIAGNOSIS — R279 Unspecified lack of coordination: Secondary | ICD-10-CM | POA: Diagnosis not present

## 2013-07-01 DIAGNOSIS — M545 Low back pain, unspecified: Secondary | ICD-10-CM | POA: Diagnosis not present

## 2013-07-01 DIAGNOSIS — R41841 Cognitive communication deficit: Secondary | ICD-10-CM | POA: Diagnosis not present

## 2013-07-01 DIAGNOSIS — E119 Type 2 diabetes mellitus without complications: Secondary | ICD-10-CM | POA: Diagnosis not present

## 2013-07-01 DIAGNOSIS — R262 Difficulty in walking, not elsewhere classified: Secondary | ICD-10-CM | POA: Diagnosis not present

## 2013-07-02 DIAGNOSIS — M545 Low back pain, unspecified: Secondary | ICD-10-CM | POA: Diagnosis not present

## 2013-07-02 DIAGNOSIS — E119 Type 2 diabetes mellitus without complications: Secondary | ICD-10-CM | POA: Diagnosis not present

## 2013-07-02 DIAGNOSIS — R41841 Cognitive communication deficit: Secondary | ICD-10-CM | POA: Diagnosis not present

## 2013-07-02 DIAGNOSIS — R262 Difficulty in walking, not elsewhere classified: Secondary | ICD-10-CM | POA: Diagnosis not present

## 2013-07-02 DIAGNOSIS — R279 Unspecified lack of coordination: Secondary | ICD-10-CM | POA: Diagnosis not present

## 2013-07-02 DIAGNOSIS — R1310 Dysphagia, unspecified: Secondary | ICD-10-CM | POA: Diagnosis not present

## 2013-07-03 DIAGNOSIS — R41841 Cognitive communication deficit: Secondary | ICD-10-CM | POA: Diagnosis not present

## 2013-07-03 DIAGNOSIS — R1310 Dysphagia, unspecified: Secondary | ICD-10-CM | POA: Diagnosis not present

## 2013-07-03 DIAGNOSIS — M545 Low back pain, unspecified: Secondary | ICD-10-CM | POA: Diagnosis not present

## 2013-07-03 DIAGNOSIS — R279 Unspecified lack of coordination: Secondary | ICD-10-CM | POA: Diagnosis not present

## 2013-07-03 DIAGNOSIS — E119 Type 2 diabetes mellitus without complications: Secondary | ICD-10-CM | POA: Diagnosis not present

## 2013-07-03 DIAGNOSIS — R262 Difficulty in walking, not elsewhere classified: Secondary | ICD-10-CM | POA: Diagnosis not present

## 2013-07-07 DIAGNOSIS — R1310 Dysphagia, unspecified: Secondary | ICD-10-CM | POA: Diagnosis not present

## 2013-07-07 DIAGNOSIS — R41841 Cognitive communication deficit: Secondary | ICD-10-CM | POA: Diagnosis not present

## 2013-07-07 DIAGNOSIS — R0609 Other forms of dyspnea: Secondary | ICD-10-CM | POA: Diagnosis not present

## 2013-07-07 DIAGNOSIS — E119 Type 2 diabetes mellitus without complications: Secondary | ICD-10-CM | POA: Diagnosis not present

## 2013-07-08 DIAGNOSIS — M79609 Pain in unspecified limb: Secondary | ICD-10-CM | POA: Diagnosis not present

## 2013-07-08 DIAGNOSIS — R1310 Dysphagia, unspecified: Secondary | ICD-10-CM | POA: Diagnosis not present

## 2013-07-08 DIAGNOSIS — M2010 Hallux valgus (acquired), unspecified foot: Secondary | ICD-10-CM | POA: Diagnosis not present

## 2013-07-08 DIAGNOSIS — L600 Ingrowing nail: Secondary | ICD-10-CM | POA: Diagnosis not present

## 2013-07-08 DIAGNOSIS — E119 Type 2 diabetes mellitus without complications: Secondary | ICD-10-CM | POA: Diagnosis not present

## 2013-07-08 DIAGNOSIS — B351 Tinea unguium: Secondary | ICD-10-CM | POA: Diagnosis not present

## 2013-07-08 DIAGNOSIS — R0609 Other forms of dyspnea: Secondary | ICD-10-CM | POA: Diagnosis not present

## 2013-07-08 DIAGNOSIS — R41841 Cognitive communication deficit: Secondary | ICD-10-CM | POA: Diagnosis not present

## 2013-07-10 DIAGNOSIS — R41841 Cognitive communication deficit: Secondary | ICD-10-CM | POA: Diagnosis not present

## 2013-07-10 DIAGNOSIS — E119 Type 2 diabetes mellitus without complications: Secondary | ICD-10-CM | POA: Diagnosis not present

## 2013-07-10 DIAGNOSIS — R1310 Dysphagia, unspecified: Secondary | ICD-10-CM | POA: Diagnosis not present

## 2013-07-10 DIAGNOSIS — R0609 Other forms of dyspnea: Secondary | ICD-10-CM | POA: Diagnosis not present

## 2013-07-15 ENCOUNTER — Encounter: Payer: Self-pay | Admitting: Internal Medicine

## 2013-07-15 DIAGNOSIS — R41841 Cognitive communication deficit: Secondary | ICD-10-CM | POA: Diagnosis not present

## 2013-07-15 DIAGNOSIS — R0609 Other forms of dyspnea: Secondary | ICD-10-CM | POA: Diagnosis not present

## 2013-07-15 DIAGNOSIS — E119 Type 2 diabetes mellitus without complications: Secondary | ICD-10-CM | POA: Diagnosis not present

## 2013-07-15 DIAGNOSIS — R1310 Dysphagia, unspecified: Secondary | ICD-10-CM | POA: Diagnosis not present

## 2013-07-17 ENCOUNTER — Ambulatory Visit: Payer: Medicare Other | Admitting: Podiatry

## 2013-07-17 DIAGNOSIS — R41841 Cognitive communication deficit: Secondary | ICD-10-CM | POA: Diagnosis not present

## 2013-07-17 DIAGNOSIS — R0609 Other forms of dyspnea: Secondary | ICD-10-CM | POA: Diagnosis not present

## 2013-07-17 DIAGNOSIS — E119 Type 2 diabetes mellitus without complications: Secondary | ICD-10-CM | POA: Diagnosis not present

## 2013-07-17 DIAGNOSIS — R1310 Dysphagia, unspecified: Secondary | ICD-10-CM | POA: Diagnosis not present

## 2013-07-18 ENCOUNTER — Other Ambulatory Visit: Payer: Self-pay | Admitting: Internal Medicine

## 2013-07-18 ENCOUNTER — Telehealth: Payer: Self-pay

## 2013-07-18 ENCOUNTER — Other Ambulatory Visit: Payer: Medicare Other

## 2013-07-18 DIAGNOSIS — E119 Type 2 diabetes mellitus without complications: Secondary | ICD-10-CM

## 2013-07-18 DIAGNOSIS — R41841 Cognitive communication deficit: Secondary | ICD-10-CM | POA: Diagnosis not present

## 2013-07-18 DIAGNOSIS — R0989 Other specified symptoms and signs involving the circulatory and respiratory systems: Secondary | ICD-10-CM | POA: Diagnosis not present

## 2013-07-18 DIAGNOSIS — E785 Hyperlipidemia, unspecified: Secondary | ICD-10-CM

## 2013-07-18 DIAGNOSIS — R0609 Other forms of dyspnea: Secondary | ICD-10-CM | POA: Diagnosis not present

## 2013-07-18 DIAGNOSIS — R1310 Dysphagia, unspecified: Secondary | ICD-10-CM | POA: Diagnosis not present

## 2013-07-18 NOTE — Telephone Encounter (Signed)
I called patient after reviewing the scheduled for Tuesday. Patient is scheduled for Chest Pains, patient states her last episode of CP was 2 days ago and it was very bad. Patient was instructed that she should go to the emergency room to be evaluated and not wait until Tuesday. Patient states she does not wish to go to ER. Patient states " I will wait to see Dr.Green on Tuesday" I informed patient of her risk factors- HTN and DM. Patient indicated she was on her way here to get labs and will keep her pending appointment. Patient had to rush off the phone for she had to get ready for pick-up from the facility, patient on her way here. I will speak with patient again when she gets here

## 2013-07-18 NOTE — Telephone Encounter (Signed)
When patient came into office for labs I reiterated the importance of seeking medical help for Chest Pain, patient again refused to go to ER and states if her symptoms return she will inform staff at assisted living facility.   I will send message to Dr.Green as a FYI.

## 2013-07-19 LAB — COMPREHENSIVE METABOLIC PANEL
ALBUMIN: 4.2 g/dL (ref 3.5–4.7)
ALT: 7 IU/L (ref 0–32)
AST: 17 IU/L (ref 0–40)
Albumin/Globulin Ratio: 1.9 (ref 1.1–2.5)
Alkaline Phosphatase: 73 IU/L (ref 39–117)
BUN/Creatinine Ratio: 25 (ref 11–26)
BUN: 28 mg/dL — AB (ref 8–27)
CO2: 20 mmol/L (ref 18–29)
Calcium: 10.1 mg/dL (ref 8.7–10.3)
Chloride: 102 mmol/L (ref 97–108)
Creatinine, Ser: 1.14 mg/dL — ABNORMAL HIGH (ref 0.57–1.00)
GFR calc non Af Amer: 43 mL/min/{1.73_m2} — ABNORMAL LOW (ref >59)
GFR, EST AFRICAN AMERICAN: 49 mL/min/{1.73_m2} — AB (ref >59)
Globulin, Total: 2.2 g/dL (ref 1.5–4.5)
Glucose: 96 mg/dL (ref 65–99)
POTASSIUM: 4.8 mmol/L (ref 3.5–5.2)
Sodium: 144 mmol/L (ref 134–144)
Total Bilirubin: 0.5 mg/dL (ref 0.0–1.2)
Total Protein: 6.4 g/dL (ref 6.0–8.5)

## 2013-07-19 LAB — LIPID PANEL
CHOLESTEROL TOTAL: 148 mg/dL (ref 100–199)
Chol/HDL Ratio: 2.6 ratio units (ref 0.0–4.4)
HDL: 58 mg/dL (ref >39)
LDL CALC: 77 mg/dL (ref 0–99)
Triglycerides: 64 mg/dL (ref 0–149)
VLDL Cholesterol Cal: 13 mg/dL (ref 5–40)

## 2013-07-19 LAB — HEMOGLOBIN A1C
Est. average glucose Bld gHb Est-mCnc: 128 mg/dL
Hgb A1c MFr Bld: 6.1 % — ABNORMAL HIGH (ref 4.8–5.6)

## 2013-07-19 NOTE — Telephone Encounter (Signed)
Contents of msg noted. Christine CelesteJessica Karam, NP also received a call from this patient. I consulted with her by phone and I agree with the recommendation to go to ER for evaluation.

## 2013-07-22 ENCOUNTER — Ambulatory Visit (INDEPENDENT_AMBULATORY_CARE_PROVIDER_SITE_OTHER): Payer: Medicare Other | Admitting: Internal Medicine

## 2013-07-22 ENCOUNTER — Encounter: Payer: Self-pay | Admitting: Internal Medicine

## 2013-07-22 VITALS — BP 124/72 | HR 86 | Temp 97.9°F | Wt 188.6 lb

## 2013-07-22 DIAGNOSIS — R079 Chest pain, unspecified: Secondary | ICD-10-CM

## 2013-07-22 DIAGNOSIS — J449 Chronic obstructive pulmonary disease, unspecified: Secondary | ICD-10-CM

## 2013-07-22 DIAGNOSIS — E119 Type 2 diabetes mellitus without complications: Secondary | ICD-10-CM

## 2013-07-22 DIAGNOSIS — I8390 Asymptomatic varicose veins of unspecified lower extremity: Secondary | ICD-10-CM

## 2013-07-22 DIAGNOSIS — R1310 Dysphagia, unspecified: Secondary | ICD-10-CM

## 2013-07-22 DIAGNOSIS — R0602 Shortness of breath: Secondary | ICD-10-CM

## 2013-07-22 DIAGNOSIS — R109 Unspecified abdominal pain: Secondary | ICD-10-CM

## 2013-07-22 DIAGNOSIS — L299 Pruritus, unspecified: Secondary | ICD-10-CM

## 2013-07-22 DIAGNOSIS — I10 Essential (primary) hypertension: Secondary | ICD-10-CM

## 2013-07-22 DIAGNOSIS — R1314 Dysphagia, pharyngoesophageal phase: Secondary | ICD-10-CM | POA: Insufficient documentation

## 2013-07-22 HISTORY — DX: Dysphagia, pharyngoesophageal phase: R13.14

## 2013-07-22 HISTORY — DX: Asymptomatic varicose veins of unspecified lower extremity: I83.90

## 2013-07-22 NOTE — Patient Instructions (Signed)
Continue current medications. 

## 2013-07-23 ENCOUNTER — Encounter: Payer: Self-pay | Admitting: *Deleted

## 2013-07-23 DIAGNOSIS — E119 Type 2 diabetes mellitus without complications: Secondary | ICD-10-CM | POA: Diagnosis not present

## 2013-07-23 DIAGNOSIS — R0609 Other forms of dyspnea: Secondary | ICD-10-CM | POA: Diagnosis not present

## 2013-07-23 DIAGNOSIS — R41841 Cognitive communication deficit: Secondary | ICD-10-CM | POA: Diagnosis not present

## 2013-07-23 DIAGNOSIS — R1310 Dysphagia, unspecified: Secondary | ICD-10-CM | POA: Diagnosis not present

## 2013-07-23 LAB — MICROALBUMIN / CREATININE URINE RATIO
Creatinine, Ur: 66.3 mg/dL (ref 15.0–278.0)
MICROALB/CREAT RATIO: 5 mg/g creat (ref 0.0–30.0)
MICROALBUM., U, RANDOM: 3.3 ug/mL (ref 0.0–17.0)

## 2013-07-23 MED ORDER — HYDROCORTISONE 1 % EX CREA: TOPICAL_CREAM | CUTANEOUS | Status: DC

## 2013-07-24 ENCOUNTER — Telehealth: Payer: Self-pay | Admitting: *Deleted

## 2013-07-24 ENCOUNTER — Ambulatory Visit: Payer: Medicare Other | Admitting: Podiatry

## 2013-07-24 NOTE — Telephone Encounter (Signed)
Patient called and stated that she would like something for Stress to be called into [facility]. States that she is very stressful and has decided she needed something. Patient also wants to know what the EKG results were. Please Advise

## 2013-07-25 ENCOUNTER — Other Ambulatory Visit: Payer: Medicare Other

## 2013-07-25 DIAGNOSIS — R41841 Cognitive communication deficit: Secondary | ICD-10-CM | POA: Diagnosis not present

## 2013-07-25 DIAGNOSIS — E119 Type 2 diabetes mellitus without complications: Secondary | ICD-10-CM | POA: Diagnosis not present

## 2013-07-25 DIAGNOSIS — R079 Chest pain, unspecified: Secondary | ICD-10-CM | POA: Insufficient documentation

## 2013-07-25 DIAGNOSIS — R1310 Dysphagia, unspecified: Secondary | ICD-10-CM | POA: Diagnosis not present

## 2013-07-25 DIAGNOSIS — R0609 Other forms of dyspnea: Secondary | ICD-10-CM | POA: Diagnosis not present

## 2013-07-25 NOTE — Progress Notes (Signed)
Patient ID: Christine Pierce, female   DOB: [birth year], 80 y.o.   MRN: [id number]    Location:  [facility]  Place of Service: OFFICE   Allergies  Allergen Reactions  . Macrodantin [Nitrofurantoin Macrocrystal]     Chief Complaint  Patient presents with  . Acute Visit    having elevated BP issues    HPI:  Chest pain, unspecified - patient recently had an episode of chest discomfort. At about that time she had significant elevations in her blood pressure. Although the chest pain quickly subsided and was not accompanied by diaphoresis or dyspnea, she was advised to go to the emergency room. She refused this when discussing a problem with my office staff, my nurse practitioner on call, and again with staff at her rest home facility. The pains have not recurred. She is particularly worried today about the elevations in blood pressure which read sinus 234/100 at the time of her chest discomfort. Additionally she is quite upset that there was not a more reliable follow through on the part of the staff at her assisted living facility in regards to repeating blood pressure checks. She is clear that she found the whole incident rather frustrating. It is also clear that she felt that the facility had more ability to handle this problem than was offered.  Type II or unspecified type diabetes mellitus without mention of complication, not stated as uncontrolled : controlled  Varicose veins: Chronic leg edema related to this problem  Hypertension: Controlled as of today's blood pressure. I find it difficult to explain the extreme elevations were recorded Friday prior to this visit. Perhaps an anxiety and chest discomfort are enough of an explanation  Shortness of breath: Chronic, mild, and slightly improved since last visit. She expresses anxiety about why she is short of breath and would like a referral to a pulmonologist.  COPD (chronic obstructive pulmonary disease) -requests referral to Pulmonology  consultants.  Abdominal  pain, other specified site: Intermittent but persistent in the left upper quadrant and lower chest area.  Dysphagia, unspecified(787.20) - intermittent and persistent. She is requesting referral to Gastroenterology.    Medications: Patient's Medications  New Prescriptions   HYDROCORTISONE CREAM 1 %    Apply to pruritic or irritated areas of legs twice daily as needed  Previous Medications   ACETAMINOPHEN (TYLENOL) 325 MG TABLET    Take 650 mg by mouth every 4 (four) hours as needed for pain.   ALBUTEROL (PROVENTIL HFA;VENTOLIN HFA) 108 (90 BASE) MCG/ACT INHALER    Inhale 2 puffs into the lungs every 6 (six) hours as needed for wheezing.   ASPIRIN EC 81 MG TABLET    Take 81 mg by mouth daily. Take 1 tablet daily to prevent heart attack, stroke and blood clot.   ESOMEPRAZOLE (NEXIUM) 20 MG CAPSULE    Take 1 capsule (20 mg total) by mouth daily at 12 noon.   FLUTICASONE-SALMETEROL (ADVAIR) 100-50 MCG/DOSE AEPB    Inhale once every 12 hours to help breathing   LOSARTAN-HYDROCHLOROTHIAZIDE (HYZAAR) 50-12.5 MG PER TABLET    Take 1 tablet by mouth daily. Take one tablet daily for blood pressure.   LOVASTATIN (MEVACOR) 40 MG TABLET    TAKE 1 TABLET DAILY TO LOWER CHOLESTEROL   MISC. DEVICES (ROLLER WALKER) MISC    Rolling Walker with seat and brakes.   MULTIPLE VITAMINS-MINERALS (MULTIVITAMIN WITH MINERALS) TABLET    Take 1 tablet by mouth daily. Take 1 tablet daily for vitamin supplement.   NITROGLYCERIN (NITROSTAT)  0.4 MG SL TABLET    Place 0.4 mg under the tongue every 5 (five) minutes as needed for chest pain.   SOLIFENACIN (VESICARE) 10 MG TABLET    One at bed to help control bladder   TIOTROPIUM (SPIRIVA HANDIHALER) 18 MCG INHALATION CAPSULE    Place 1 capsule (18 mcg total) into inhaler and inhale daily.   TRAMADOL (ULTRAM) 50 MG TABLET    One every 4 hours if needed for pain   TRIAMCINOLONE CREAM (KENALOG) 0.1 %    Apply 1 application topically as needed. Apply  daily to crusty lesions.  Modified Medications   No medications on file  Discontinued Medications   No medications on file     Review of Systems  Constitutional: Positive for fatigue. Negative for fever, chills, diaphoresis, activity change, appetite change and unexpected weight change.  HENT: Positive for hearing loss.   Eyes: Negative.   Respiratory: Positive for cough and shortness of breath.        Complains of a hoarse voice for the last few months. There is no pain.  Cardiovascular: Positive for chest pain (Recent chest discomfort resolved within less than one half hour and has not recurred.) and leg swelling. Negative for palpitations.  Gastrointestinal:       Occasional fecal incontinence. Sometimes has difficulty swallowing. Solids and liquids seem to be involved. There is no odynophagia.  Endocrine:       Elevated blood sugars. Currently on "dietary control". She is not following her diet closely.  Genitourinary: Positive for urgency and frequency. Negative for dysuria, decreased urine volume, vaginal bleeding, vaginal discharge, enuresis, genital sores, vaginal pain and pelvic pain.  Musculoskeletal:       Generalized weakness: Pain in the right hip area. Requires assistance in getting up and down. At risk for falls.  Skin: Negative.   Allergic/Immunologic: Negative.   Neurological: Positive for weakness.       Memory loss.  Hematological: Negative.   Psychiatric/Behavioral: Positive for confusion. The patient is nervous/anxious.        History of hoarding behavior. Impulsive buying of products on television. Some agitation about recent episode of chest discomfort and elevated blood pressure, which she feels may not have been handled appropriately.    Filed Vitals:   07/22/13 1357  BP: 124/72  Pulse: 86  Temp: 97.9 F (36.6 C)  TempSrc: Oral  Weight: 188 lb 9.6 oz (85.548 kg)  SpO2: 97%   Body mass index is 31.88 kg/(m^2).  Physical Exam  Constitutional: She  is oriented to person, place, and time. No distress.  Overweight  HENT:  Head: Atraumatic.  Partial deafness. Uses hearing aids.  Eyes:  Wears prescription lenses.  Neck: No JVD present. No tracheal deviation present. No thyromegaly present.  Some tenderness with movement laterally to either side.  Cardiovascular: Normal rate, regular rhythm, normal heart sounds and intact distal pulses.  Exam reveals no friction rub.   No murmur heard. Pulmonary/Chest: No respiratory distress. She has no wheezes. She has no rales. She exhibits no tenderness.  Tender left flank area lower rib margins laterally and slightly anteriorly. Voice is slightly hoarse.  Abdominal: Soft. Bowel sounds are normal. She exhibits no distension and no mass. There is no tenderness.  Musculoskeletal: Normal range of motion. She exhibits no edema and no tenderness.  Tender the greater trochanter and right groin. She is having great difficulty moving through the hip area when she tries to walk. She is hobbled by this decrease in  her stride length.  Lymphadenopathy:    She has no cervical adenopathy.  Neurological: She is alert and oriented to person, place, and time. No cranial nerve deficit. She exhibits normal muscle tone. Coordination normal.  04/30/12 MMSE 27/30. Passed clock drawing. Having some trouble in sequencing events and in telling a coherent story.   Skin: No rash noted. She is not diaphoretic. No erythema. No pallor.  Psychiatric: She has a normal mood and affect. Her behavior is normal. Judgment and thought content normal.     Labs reviewed: Office Visit on 07/22/2013  Component Date Value Ref Range Status  . Creatinine, Ur 07/22/2013 66.3  15.0 - 278.0 mg/dL Final  . Microalbum.,U,Random 07/22/2013 3.3  0.0 - 17.0 ug/mL Final   **Verified by repeat analysis**  . MICROALB/CREAT RATIO 07/22/2013 5.0  0.0 - 30.0 mg/g creat Final  Appointment on 07/18/2013  Component Date Value Ref Range Status  .  Hemoglobin A1C 07/18/2013 6.1* 4.8 - 5.6 % Final   Comment:          Increased risk for diabetes: 5.7 - 6.4                                   Diabetes: >6.4                                   Glycemic control for adults with diabetes: <7.0  . Estimated average glucose 07/18/2013 128   Final  . Glucose 07/18/2013 96  65 - 99 mg/dL Final  . BUN 81/19/1478 28* 8 - 27 mg/dL Final  . Creatinine, Ser 07/18/2013 1.14* 0.57 - 1.00 mg/dL Final  . GFR calc non Af Amer 07/18/2013 43* >59 mL/min/1.73 Final  . GFR calc Af Amer 07/18/2013 49* >59 mL/min/1.73 Final  . BUN/Creatinine Ratio 07/18/2013 25  11 - 26 Final  . Sodium 07/18/2013 144  134 - 144 mmol/L Final  . Potassium 07/18/2013 4.8  3.5 - 5.2 mmol/L Final  . Chloride 07/18/2013 102  97 - 108 mmol/L Final  . CO2 07/18/2013 20  18 - 29 mmol/L Final  . Calcium 07/18/2013 10.1  8.7 - 10.3 mg/dL Final  . Total Protein 07/18/2013 6.4  6.0 - 8.5 g/dL Final  . Albumin 29/56/2130 4.2  3.5 - 4.7 g/dL Final  . Globulin, Total 07/18/2013 2.2  1.5 - 4.5 g/dL Final  . Albumin/Globulin Ratio 07/18/2013 1.9  1.1 - 2.5 Final  . Total Bilirubin 07/18/2013 0.5  0.0 - 1.2 mg/dL Final  . Alkaline Phosphatase 07/18/2013 73  39 - 117 IU/L Final  . AST 07/18/2013 17  0 - 40 IU/L Final  . ALT 07/18/2013 7  0 - 32 IU/L Final  . Cholesterol, Total 07/18/2013 148  100 - 199 mg/dL Final  . Triglycerides 07/18/2013 64  0 - 149 mg/dL Final  . HDL 86/57/8469 58  >39 mg/dL Final   Comment: According to ATP-III Guidelines, HDL-C >59 mg/dL is considered a                          negative risk factor for CHD.  Marland Kitchen VLDL Cholesterol Cal 07/18/2013 13  5 - 40 mg/dL Final  . LDL Calculated 07/18/2013 77  0 - 99 mg/dL Final  . Chol/HDL Ratio 07/18/2013 2.6  0.0 - 4.4 ratio units  Final   Comment:                                   T. Chol/HDL Ratio                                                                      Men  Women                                                         1/2 Avg.Risk  3.4    3.3                                                            Avg.Risk  5.0    4.4                                                         2X Avg.Risk  9.6    7.1                                                         3X Avg.Risk 23.4   11.0   07/22/13 EKG: Rate 81. Normal sinus rhythm left axis deviation. No other abnormalities.   Assessment/Plan  1. Chest pain, unspecified Appears to be resolved. Electrocardiogram was normal. Patient requested that she be allowed to keep nitroglycerin at her bedside. I think this is reasonable. - EKG 12-Lead  2. Type II or unspecified type diabetes mellitus without mention of complication, not stated as uncontrolled Controlled - Microalbumin/Creatinine Ratio, Urine  3. Pruritus Mainly involves the legs. I recommended 1% hydrocortisone cream available over-the-counter to the legs as needed for pruritus.  4. Varicose veins Chronic mild anemia related to this and possibly the pruritus on the legs.  5. Hypertension Controlled at this time. Recent elevations have not been sustained.  6. Shortness of breath Persistent.  7. COPD (chronic obstructive pulmonary disease) Chronic. - Ambulatory referral to Pulmonology  8. Abdominal  pain, other specified site Chronic. Mainly involving the left upper quadrant and lower flank area  9. Dysphagia, unspecified(787.20) Speech and language therapist has recommended further treatment by her. I do not think that we have an adequate diagnosis for her dysphagia. Prior to ordering additional speech and language therapy, I think she should see a gastroenterologist. The patient prefers Dr. Matthias Hughs. - Ambulatory referral to Gastroenterology

## 2013-07-29 ENCOUNTER — Ambulatory Visit: Payer: Medicare Other | Admitting: Internal Medicine

## 2013-07-29 NOTE — Telephone Encounter (Signed)
EKG was normal. : Prescription for alprazolam 0.5 mg (#30) one up to twice daily if needed for anxiety. Be sure her rest home knows about this.

## 2013-07-30 ENCOUNTER — Other Ambulatory Visit: Payer: Self-pay | Admitting: *Deleted

## 2013-07-30 ENCOUNTER — Ambulatory Visit: Payer: Medicare Other | Admitting: Internal Medicine

## 2013-07-30 DIAGNOSIS — R41841 Cognitive communication deficit: Secondary | ICD-10-CM | POA: Diagnosis not present

## 2013-07-30 DIAGNOSIS — R0609 Other forms of dyspnea: Secondary | ICD-10-CM | POA: Diagnosis not present

## 2013-07-30 DIAGNOSIS — R1310 Dysphagia, unspecified: Secondary | ICD-10-CM | POA: Diagnosis not present

## 2013-07-30 DIAGNOSIS — E119 Type 2 diabetes mellitus without complications: Secondary | ICD-10-CM | POA: Diagnosis not present

## 2013-07-30 MED ORDER — ALPRAZOLAM 0.5 MG PO TABS: ORAL_TABLET | ORAL | Status: DC

## 2013-07-30 MED ORDER — HYDROCORTISONE 1 % EX CREA: TOPICAL_CREAM | CUTANEOUS | Status: DC

## 2013-07-30 NOTE — Telephone Encounter (Signed)
Patient Notified and called in rx into pharmacy

## 2013-08-01 DIAGNOSIS — R0609 Other forms of dyspnea: Secondary | ICD-10-CM | POA: Diagnosis not present

## 2013-08-01 DIAGNOSIS — R1310 Dysphagia, unspecified: Secondary | ICD-10-CM | POA: Diagnosis not present

## 2013-08-01 DIAGNOSIS — R41841 Cognitive communication deficit: Secondary | ICD-10-CM | POA: Diagnosis not present

## 2013-08-01 DIAGNOSIS — E119 Type 2 diabetes mellitus without complications: Secondary | ICD-10-CM | POA: Diagnosis not present

## 2013-08-05 DIAGNOSIS — E119 Type 2 diabetes mellitus without complications: Secondary | ICD-10-CM | POA: Diagnosis not present

## 2013-08-05 DIAGNOSIS — R41841 Cognitive communication deficit: Secondary | ICD-10-CM | POA: Diagnosis not present

## 2013-08-05 DIAGNOSIS — R0609 Other forms of dyspnea: Secondary | ICD-10-CM | POA: Diagnosis not present

## 2013-08-05 DIAGNOSIS — R0989 Other specified symptoms and signs involving the circulatory and respiratory systems: Secondary | ICD-10-CM | POA: Diagnosis not present

## 2013-08-05 DIAGNOSIS — R1310 Dysphagia, unspecified: Secondary | ICD-10-CM | POA: Diagnosis not present

## 2013-08-07 DIAGNOSIS — R0989 Other specified symptoms and signs involving the circulatory and respiratory systems: Secondary | ICD-10-CM | POA: Diagnosis not present

## 2013-08-07 DIAGNOSIS — R1310 Dysphagia, unspecified: Secondary | ICD-10-CM | POA: Diagnosis not present

## 2013-08-07 DIAGNOSIS — E119 Type 2 diabetes mellitus without complications: Secondary | ICD-10-CM | POA: Diagnosis not present

## 2013-08-07 DIAGNOSIS — R0609 Other forms of dyspnea: Secondary | ICD-10-CM | POA: Diagnosis not present

## 2013-08-07 DIAGNOSIS — R41841 Cognitive communication deficit: Secondary | ICD-10-CM | POA: Diagnosis not present

## 2013-08-13 ENCOUNTER — Institutional Professional Consult (permissible substitution): Payer: Medicare Other | Admitting: Emergency Medicine

## 2013-08-13 DIAGNOSIS — R41841 Cognitive communication deficit: Secondary | ICD-10-CM | POA: Diagnosis not present

## 2013-08-13 DIAGNOSIS — R1310 Dysphagia, unspecified: Secondary | ICD-10-CM | POA: Diagnosis not present

## 2013-08-13 DIAGNOSIS — E119 Type 2 diabetes mellitus without complications: Secondary | ICD-10-CM | POA: Diagnosis not present

## 2013-08-13 DIAGNOSIS — R0609 Other forms of dyspnea: Secondary | ICD-10-CM | POA: Diagnosis not present

## 2013-08-14 ENCOUNTER — Other Ambulatory Visit: Payer: Self-pay | Admitting: Gastroenterology

## 2013-08-14 DIAGNOSIS — R1310 Dysphagia, unspecified: Secondary | ICD-10-CM

## 2013-08-14 DIAGNOSIS — K219 Gastro-esophageal reflux disease without esophagitis: Secondary | ICD-10-CM | POA: Diagnosis not present

## 2013-08-19 ENCOUNTER — Ambulatory Visit
Admission: RE | Admit: 2013-08-19 | Discharge: 2013-08-19 | Disposition: A | Discharge status: Home / Self Care | Payer: Medicare Other | Source: Ambulatory Visit | Attending: Gastroenterology | Admitting: Gastroenterology

## 2013-08-19 DIAGNOSIS — R1310 Dysphagia, unspecified: Secondary | ICD-10-CM | POA: Diagnosis not present

## 2013-08-22 DIAGNOSIS — R1310 Dysphagia, unspecified: Secondary | ICD-10-CM | POA: Diagnosis not present

## 2013-08-25 ENCOUNTER — Encounter (HOSPITAL_COMMUNITY): Payer: Self-pay | Admitting: Pharmacy Technician

## 2013-08-26 ENCOUNTER — Encounter (HOSPITAL_COMMUNITY): Payer: Self-pay | Admitting: *Deleted

## 2013-09-01 ENCOUNTER — Other Ambulatory Visit: Payer: Self-pay | Admitting: Gastroenterology

## 2013-09-01 NOTE — Addendum Note (Signed)
Addended by: OUTLAW, WILLIAM on: 09/01/2013 05:19 PM   Modules accepted: Orders  

## 2013-09-03 ENCOUNTER — Encounter (HOSPITAL_COMMUNITY)
Admission: RE | Disposition: A | Discharge status: Nursing Home (Includes ICF) | Payer: Self-pay | Source: Ambulatory Visit | Attending: Gastroenterology

## 2013-09-03 ENCOUNTER — Encounter (HOSPITAL_COMMUNITY): Payer: Medicare Other | Admitting: Registered Nurse

## 2013-09-03 ENCOUNTER — Encounter (HOSPITAL_COMMUNITY): Payer: Self-pay

## 2013-09-03 ENCOUNTER — Ambulatory Visit (HOSPITAL_COMMUNITY): Payer: Medicare Other | Admitting: Registered Nurse

## 2013-09-03 ENCOUNTER — Ambulatory Visit (HOSPITAL_COMMUNITY)
Admission: RE | Admit: 2013-09-03 | Discharge: 2013-09-03 | Disposition: A | Discharge status: Other Acute Inpatient Hospital | Payer: Medicare Other | Source: Ambulatory Visit | Attending: Gastroenterology | Admitting: Gastroenterology

## 2013-09-03 DIAGNOSIS — R1310 Dysphagia, unspecified: Secondary | ICD-10-CM | POA: Diagnosis not present

## 2013-09-03 DIAGNOSIS — R4789 Other speech disturbances: Secondary | ICD-10-CM | POA: Diagnosis not present

## 2013-09-03 DIAGNOSIS — K219 Gastro-esophageal reflux disease without esophagitis: Secondary | ICD-10-CM | POA: Diagnosis not present

## 2013-09-03 DIAGNOSIS — J449 Chronic obstructive pulmonary disease, unspecified: Secondary | ICD-10-CM | POA: Insufficient documentation

## 2013-09-03 DIAGNOSIS — I10 Essential (primary) hypertension: Secondary | ICD-10-CM | POA: Diagnosis not present

## 2013-09-03 DIAGNOSIS — K449 Diaphragmatic hernia without obstruction or gangrene: Secondary | ICD-10-CM | POA: Diagnosis not present

## 2013-09-03 DIAGNOSIS — R933 Abnormal findings on diagnostic imaging of other parts of digestive tract: Secondary | ICD-10-CM | POA: Diagnosis not present

## 2013-09-03 DIAGNOSIS — E119 Type 2 diabetes mellitus without complications: Secondary | ICD-10-CM | POA: Diagnosis not present

## 2013-09-03 DIAGNOSIS — J4489 Other specified chronic obstructive pulmonary disease: Secondary | ICD-10-CM | POA: Insufficient documentation

## 2013-09-03 HISTORY — PX: BOTOX INJECTION: SHX5754

## 2013-09-03 HISTORY — PX: ESOPHAGOGASTRODUODENOSCOPY (EGD) WITH PROPOFOL: SHX5813

## 2013-09-03 HISTORY — DX: Unspecified hearing loss, unspecified ear: H91.90

## 2013-09-03 LAB — GLUCOSE, CAPILLARY: Glucose-Capillary: 93 mg/dL (ref 70–99)

## 2013-09-03 SURGERY — ESOPHAGOGASTRODUODENOSCOPY (EGD) WITH PROPOFOL
Anesthesia: Monitor Anesthesia Care

## 2013-09-03 MED ORDER — LACTATED RINGERS IV SOLN
INTRAVENOUS | Status: DC
  Administered 2013-09-03: 1000 mL via INTRAVENOUS

## 2013-09-03 MED ORDER — BUTAMBEN-TETRACAINE-BENZOCAINE 2-2-14 % EX AERO
INHALATION_SPRAY | CUTANEOUS | Status: DC | PRN
  Administered 2013-09-03: 2 via TOPICAL

## 2013-09-03 MED ORDER — FENTANYL CITRATE 0.05 MG/ML IJ SOLN: 25.0000 ug | INTRAMUSCULAR | Status: DC | PRN

## 2013-09-03 MED ORDER — PROPOFOL 10 MG/ML IV BOLUS
INTRAVENOUS | Status: AC
  Filled 2013-09-03: qty 20

## 2013-09-03 MED ORDER — LABETALOL HCL 5 MG/ML IV SOLN
INTRAVENOUS | Status: DC | PRN
  Administered 2013-09-03 (×2): 5 mg via INTRAVENOUS

## 2013-09-03 MED ORDER — LIDOCAINE HCL (CARDIAC) 20 MG/ML IV SOLN
INTRAVENOUS | Status: DC | PRN
  Administered 2013-09-03: 100 mg via INTRAVENOUS

## 2013-09-03 MED ORDER — PROPOFOL INFUSION 10 MG/ML OPTIME
INTRAVENOUS | Status: DC | PRN
  Administered 2013-09-03: 300 ug/kg/min via INTRAVENOUS

## 2013-09-03 MED ORDER — LIDOCAINE HCL (CARDIAC) 20 MG/ML IV SOLN
INTRAVENOUS | Status: AC
  Filled 2013-09-03: qty 5

## 2013-09-03 MED ORDER — SODIUM CHLORIDE 0.9 % IJ SOLN
100.0000 [IU] | Freq: Once | INTRAMUSCULAR | Status: DC
  Filled 2013-09-03: qty 100

## 2013-09-03 MED ORDER — SODIUM CHLORIDE 0.9 % IV SOLN: INTRAVENOUS | Status: DC

## 2013-09-03 MED ORDER — LACTATED RINGERS IV SOLN
INTRAVENOUS | Status: DC | PRN
  Administered 2013-09-03: 10:00:00 via INTRAVENOUS

## 2013-09-03 MED ORDER — GLYCOPYRROLATE 0.2 MG/ML IJ SOLN
INTRAMUSCULAR | Status: DC | PRN
  Administered 2013-09-03: 0.2 mg via INTRAVENOUS

## 2013-09-03 MED ORDER — ONABOTULINUMTOXINA 100 UNITS IJ SOLR
INTRAMUSCULAR | Status: DC | PRN
  Administered 2013-09-03: 11:00:00 via SUBMUCOSAL

## 2013-09-03 MED ORDER — GLYCOPYRROLATE 0.2 MG/ML IJ SOLN
INTRAMUSCULAR | Status: AC
  Filled 2013-09-03: qty 1

## 2013-09-03 SURGICAL SUPPLY — 15 items

## 2013-09-03 NOTE — Op Note (Signed)
[facility] [address] [place], [id number]   ENDOSCOPY PROCEDURE REPORT  PATIENT: Christine GuilesWheaton, Elliett J.  MR#: [id number] BIRTHDATE: 1921-08-08 , 89  yrs. old GENDER: Female ENDOSCOPIST: Willis ModenaWilliam Rodneshia Greenhouse, MD REFERRED BY:  Murray HodgkinsArthur Green, M.D. PROCEDURE DATE:  09/03/2013 PROCEDURE:  EGD w/ directed submucosal injection(s), any substance ASA CLASS:     Class III INDICATIONS:  dysphagia, abnormal barium esophagram (poor esophageal motility, failure of lower esophageal sphincter to relax).. MEDICATIONS: MAC sedation, administered by CRNA  DESCRIPTION OF PROCEDURE: After the risks benefits and alternatives of the procedure were thoroughly explained, informed consent was obtained.  The Pentax Gastroscope [id number] endoscope was introduced through the mouth and advanced to the second portion of the duodenum. Without limitations.  The instrument was slowly withdrawn as the mucosa was fully examined.     Findings:   Minimal esophageal motility.  GE junction actually not significantly stenotic.  No esophageal mass, stricture, or features of eosinophilic esophagitis.  Small hiatal hernia.  Small amount of retained gastric contents.  Otherwise normal exam to the second portion of the duodenum.   Based on esophagram findings, a total of 100Units of Botox was injected quadrantically (25U per quadrant) one cm proximal to the GE junction, employing standard protocol. Appropriate post-injection blebs were noted.           The scope was then withdrawn from the patient and the procedure completed.  ENDOSCOPIC IMPRESSION:     As above.  Successful injection of botox quadrantically 1cm proximal to the GE junction.  RECOMMENDATIONS:     1.  Watch for potential complications of procedure. 2.  Follow clinical response to botox injection. 3.  If no improvement with Botox, patient's dysphagia is likely predominantly due to presbyesophagus (dysmotility of the elderly), for which there is no  good treatment. 4.  Follow-up with [facility] in 2-3 months.  eSigned:  Willis ModenaWilliam Zella Dewan, MD 09/03/2013 10:52 AM   CC:

## 2013-09-03 NOTE — Anesthesia Postprocedure Evaluation (Signed)
  Anesthesia Post-op Note  Patient: Christine Pierce  Procedure(s) Performed: Procedure(s) (LRB): ESOPHAGOGASTRODUODENOSCOPY (EGD) WITH PROPOFOL (N/A) BOTOX INJECTION (N/A)  Patient Location: PACU  Anesthesia Type: MAC  Level of Consciousness: awake and alert   Airway and Oxygen Therapy: Patient Spontanous Breathing  Post-op Pain: mild  Post-op Assessment: Post-op Vital signs reviewed, Patient's Cardiovascular Status Stable, Respiratory Function Stable, Patent Airway and No signs of Nausea or vomiting  Last Vitals:  Filed Vitals:   09/03/13 1110  BP: 145/75  Pulse: 70  Temp:   Resp: 18    Post-op Vital Signs: stable   Complications: No apparent anesthesia complications

## 2013-09-03 NOTE — H&P (Signed)
Patient interval history reviewed.  Patient examined again.  There has been no change from documented H/P dated 08/22/13 (scanned into chart from our office) except as documented above.  Assessment:  1.  Dysphagia.  Barium esophagram worrisome for achalasia.  Plan:  1.  Endoscopy with possible esophageal (TTS Balloon) dilatation versus possible botulinum toxin injection. 2.  Risks (bleeding, infection, bowel perforation that could require surgery, sedation-related changes in cardiopulmonary systems), benefits (identification and possible treatment of source of symptoms, exclusion of certain causes of symptoms), and alternatives (watchful waiting, radiographic imaging studies, empiric medical treatment) of upper endoscopy with possible esophageal dilation versus botox injection (EGD +/- DIL +/- botox) were explained to patient/family in detail and patient wishes to proceed.

## 2013-09-03 NOTE — Transfer of Care (Signed)
Immediate Anesthesia Transfer of Care Note  Patient: Christine GuilesMary J Pierce  Procedure(s) Performed: Procedure(s): ESOPHAGOGASTRODUODENOSCOPY (EGD) WITH PROPOFOL (N/A) BOTOX INJECTION (N/A)  Patient Location: PACU  Anesthesia Type:MAC  Level of Consciousness: awake, alert , oriented and patient cooperative  Airway & Oxygen Therapy: Patient Spontanous Breathing and Patient connected to nasal cannula oxygen  Post-op Assessment: Report given to PACU RN, Post -op Vital signs reviewed and stable and Patient moving all extremities X 4  Post vital signs: stable  Complications: No apparent anesthesia complications

## 2013-09-03 NOTE — Discharge Instructions (Signed)
Endoscopy  Care After Please read the instructions outlined below and refer to this sheet in the next few weeks. These discharge instructions provide you with general information on caring for yourself after you leave the hospital. Your doctor may also give you specific instructions. While your treatment has been planned according to the most current medical practices available, unavoidable complications occasionally occur. If you have any problems or questions after discharge, please call Dr. Dulce [facility]utlaw [facility]) at [phone].  HOME CARE INSTRUCTIONS Activity  You may resume your regular activity but move at a slower pace for the next 24 hours.   Take frequent rest periods for the next 24 hours.   Walking will help expel (get rid of) the air and reduce the bloated feeling in your abdomen.   No driving for 24 hours (because of the anesthesia (medicine) used during the test).   You may shower.   Do not sign any important legal documents or operate any machinery for 24 hours (because of the anesthesia used during the test).  Nutrition  Drink plenty of fluids.   Soft mechanical diet today (finely chopped meats and vegetables ok).  Resume pre-procedural diet starting TOMORROW  Avoid alcoholic beverages for 24 hours or as instructed by your caregiver.  Medications You may resume your normal medications unless your caregiver tells you otherwise. What you can expect today  You may experience abdominal discomfort such as a feeling of fullness or "gas" pains.   You may experience a sore throat for 2 to 3 days. This is normal. Gargling with salt water may help this.    SEEK IMMEDIATE MEDICAL CARE IF:  You have excessive nausea (feeling sick to your stomach) and/or vomiting.   You have severe abdominal pain and distention (swelling).   You have trouble swallowing.   You have a temperature over 100 F (37.8 C).   You have rectal bleeding or vomiting of blood.   Document Released: 10/05/2003 Document Revised: 11/02/2010 Document Reviewed: 04/17/2007 [facility] Patient Information 2012 [employer] [place].

## 2013-09-03 NOTE — Progress Notes (Signed)
BP 217/95 in recovery, CRNA notified. Per Marylu LundJanet, CRNA, gave pt copy of vital signs for recovery period to give to her PCP

## 2013-09-03 NOTE — Anesthesia Preprocedure Evaluation (Addendum)
Anesthesia Evaluation  Patient identified by MRN, date of birth, ID band Patient awake    Reviewed: Allergy & Precautions, H&P , NPO status , Patient's Chart, lab work & pertinent test results  Airway Mallampati: II TM Distance: >3 FB Neck ROM: full    Dental no notable dental hx. (+) Teeth Intact, Dental Advisory Given   Pulmonary neg pulmonary ROS, shortness of breath and with exertion, COPD COPD inhaler,  breath sounds clear to auscultation  Pulmonary exam normal       Cardiovascular Exercise Tolerance: Good hypertension, Pt. on medications negative cardio ROS  Rhythm:regular Rate:Normal     Neuro/Psych negative neurological ROS  negative psych ROS   GI/Hepatic negative GI ROS, Neg liver ROS, GERD-  Medicated and Controlled,dysphagia   Endo/Other  negative endocrine ROSdiabetes, Well Controlled  Renal/GU negative Renal ROSStage 3 chronic kidney disease  negative genitourinary   Musculoskeletal   Abdominal   Peds  Hematology negative hematology ROS (+)   Anesthesia Other Findings   Reproductive/Obstetrics negative OB ROS                          Anesthesia Physical Anesthesia Plan  ASA: III  Anesthesia Plan: MAC   Post-op Pain Management:    Induction:   Airway Management Planned:   Additional Equipment:   Intra-op Plan:   Post-operative Plan:   Informed Consent: I have reviewed the patients History and Physical, chart, labs and discussed the procedure including the risks, benefits and alternatives for the proposed anesthesia with the patient or authorized representative who has indicated his/her understanding and acceptance.   Dental Advisory Given  Plan Discussed with: CRNA and Surgeon  Anesthesia Plan Comments:         Anesthesia Quick Evaluation

## 2013-09-04 ENCOUNTER — Encounter (HOSPITAL_COMMUNITY): Payer: Self-pay | Admitting: Gastroenterology

## 2013-09-04 ENCOUNTER — Ambulatory Visit (INDEPENDENT_AMBULATORY_CARE_PROVIDER_SITE_OTHER): Payer: Medicare Other | Admitting: Nurse Practitioner

## 2013-09-04 VITALS — BP 158/80 | HR 93 | Temp 98.6°F | Resp 18 | Ht 64.0 in | Wt 183.0 lb

## 2013-09-04 DIAGNOSIS — F411 Generalized anxiety disorder: Secondary | ICD-10-CM

## 2013-09-04 DIAGNOSIS — I10 Essential (primary) hypertension: Secondary | ICD-10-CM

## 2013-09-04 MED ORDER — SERTRALINE HCL 50 MG PO TABS: ORAL_TABLET | ORAL | Status: DC

## 2013-09-04 MED ORDER — ALPRAZOLAM 0.5 MG PO TABS: 0.5000 mg | ORAL_TABLET | Freq: Two times a day (BID) | ORAL | Status: DC | PRN

## 2013-09-04 MED ORDER — ALPRAZOLAM 0.5 MG PO TABS: ORAL_TABLET | ORAL | Status: DC

## 2013-09-04 NOTE — Patient Instructions (Addendum)
-  Will start Zoloft for mood, stress and anxiety- start with 1/2 tablet (25 mg) for 1 week then increase to 1 tablet 50 mg daily -to take blood pressure once weekly  -- make sure you have taken all blood pressure medication and have been sitting for at least 5 mins Goal is for sbp to be below 160- call if need appt sooner than one schedule with Dr Chilton SiGreen

## 2013-09-04 NOTE — Progress Notes (Signed)
Patient ID: Christine Pierce, female   DOB: 11-Oct-1921, 80 y.o.   MRN: [id number]    Allergies  Allergen Reactions  . Macrodantin [Nitrofurantoin Macrocrystal]     Per Mar    Chief Complaint  Patient presents with  . Acute Visit    bp uncontrolled, medication changes    HPI: Patient is a 80 y.o. female seen in the office today for follow up on blood pressure Here with friend who is concerned over her blood pressure that occasionally goes up.  Not happy at her currently living situation.  During her endoscopy yesterday her blood pressure was 217/95, was given labetalol and sent home with bp 190/90 has had occasional high blood pressure  Would like blood pressure checked weekly   Needs new xanax prescription to give to facility- having increased anxiety at living facility  Increased worry and stress, pt reports this is only 1-2 times a week but friend says its more often.  Review of Systems:  Review of Systems  Constitutional: Negative for fever, chills and malaise/fatigue.  HENT: Positive for hearing loss. Negative for ear discharge and ear pain.   Respiratory: Negative for cough and shortness of breath.   Cardiovascular: Negative for chest pain, palpitations and leg swelling.  Gastrointestinal: Negative for abdominal pain, diarrhea and constipation.  Genitourinary: Negative for dysuria.  Neurological: Negative for weakness.  Psychiatric/Behavioral: The patient is nervous/anxious and has insomnia.      Past Medical History  Diagnosis Date  . Hypertension 10/23/2011  . Shortness of breath 04/30/2012  . Other nonspecific abnormal finding of lung field   . Contact with or exposure to unspecified communicable disease   . Cystocele, midline 04/10/2012  . Cough 04/10/2012  . Full incontinence of feces 04/10/2012  . Urgency of urination 04/10/2012  . Abdominal pain, other specified site   . Orthopnea 12/01/2011  . Type II or unspecified type diabetes mellitus without mention of  complication, not stated as uncontrolled 11/16/2009  . Other and unspecified hyperlipidemia 10/20/2011  . Urinary tract infection, site not specified 09/11/2011  . Muscle weakness (generalized) 09/11/2011  . Syncope and collapse 03/29/2011  . Pain in joint, ankle and foot 12/27/2010  . Sprain of ribs 08/15/2010  . Unspecified tinnitus 07/04/2010  . Pneumonia, organism unspecified 05/04/2010  . Diverticulosis of colon (without mention of hemorrhage)   . Other and unspecified ovarian cyst 05/04/2010  . Abdominal pain, right lower quadrant 05/02/2010  . Anxiety state, unspecified 02/14/2010  . Shortness of breath 01/05/2010  . Nausea alone 12/13/2009  . Dehydration   . Pain in joint, shoulder region 11/15/2009  . Other abnormal blood chemistry   . Pain in joint, pelvic region and thigh 07/29/2009  . Personal history of fall 07/29/2009  . Other malaise and fatigue 02/15/2009  . Obesity, unspecified 07/16/2008  . Unspecified hereditary and idiopathic peripheral neuropathy   . Reflux esophagitis 10/18/2007  . Unspecified vitamin D deficiency 07/19/2007  . Chest pain, unspecified 07/19/2007  . Varicose veins of lower extremities with inflammation 04/25/2006  . Restless legs syndrome (RLS) 01/16/2006  . Nocturia 07/07/2005  . Encounter for long-term (current) use of other medications   . Cervicalgia 01/20/2004  . Osteoporosis, unspecified 01/23/2003  . Sciatica 04/03/2001  . Routine general medical examination at a health care facility 11/15/2009  . Pain in joint, lower leg 12/11/2001  . Lumbago 02/20/2001  . Hard of hearing     both ears  . Renal disorder     last saw  dr deterding 2 months ago, all new meds must be oked thru dr deterding  . Chronic kidney disease, stage III (moderate) 12/15/09  . Acute kidney failure, unspecified 12/10/2009  . Hydronephrosis 07/16/2008  . Cold    Past Surgical History  Procedure Laterality Date  . Appendectomy    . Stapedectomy Bilateral  [phone]    middle ear surgery  . Eye surgery Left 1988    cataract w IOL implant, Dr. Vic Ripper  . Fracture surgery Right 09/2004    distal radius/Dr. Teressa Senter  . Tonsillectomy and adenoidectomy  age 66  . Esophagogastroduodenoscopy (egd) with propofol N/A 09/03/2013    Procedure: ESOPHAGOGASTRODUODENOSCOPY (EGD) WITH PROPOFOL;  Surgeon: Willis Modena, MD;  Location: [facility];  Service: Endoscopy;  Laterality: N/A;  . Botox injection N/A 09/03/2013    Procedure: BOTOX INJECTION;  Surgeon: Willis Modena, MD;  Location: [facility];  Service: Endoscopy;  Laterality: N/A;   Social History:   reports that she has never smoked. She has never used smokeless tobacco. She reports that she does not drink alcohol or use illicit drugs.  Family History  Problem Relation Age of Onset  . Cancer Mother     abdomen  . Cancer Father     brain, lung  . Heart disease Son     Medications: Patient's Medications  New Prescriptions   No medications on file  Previous Medications   ACETAMINOPHEN (TYLENOL) 325 MG TABLET    Take 650 mg by mouth every 4 (four) hours as needed for pain.   ALBUTEROL (PROVENTIL HFA;VENTOLIN HFA) 108 (90 BASE) MCG/ACT INHALER    Inhale 2 puffs into the lungs every 6 (six) hours as needed for wheezing.   ASPIRIN 81 MG CHEWABLE TABLET    Chew 81 mg by mouth daily.   DEXTROMETHORPHAN-GUAIFENESIN (MUCINEX DM) 30-600 MG PER 12 HR TABLET    Take 1 tablet by mouth 2 (two) times daily as needed for cough.   FLUTICASONE-SALMETEROL (ADVAIR) 100-50 MCG/DOSE AEPB    Inhale once every 12 hours to help breathing   HYDROCORTISONE CREAM 1 %    Apply to pruritic or irritated areas of legs twice daily as needed   LOSARTAN-HYDROCHLOROTHIAZIDE (HYZAAR) 50-12.5 MG PER TABLET    Take 1 tablet by mouth every morning. Take one tablet daily for blood pressure.   LOVASTATIN (MEVACOR) 40 MG TABLET    Take 40 mg by mouth every morning.   MELATONIN 3 MG CAPS    Take 3 mg by mouth at bedtime as needed  (Sleep).   MISC. DEVICES (ROLLER WALKER) MISC    Rolling Walker with seat and brakes.   MULTIPLE VITAMIN (MULTIVITAMIN WITH MINERALS) TABS TABLET    Take 1 tablet by mouth daily.   NITROGLYCERIN (NITROSTAT) 0.4 MG SL TABLET    Place 0.4 mg under the tongue every 5 (five) minutes as needed for chest pain.   OMEPRAZOLE (PRILOSEC) 20 MG CAPSULE    Take 40 mg by mouth every morning.   OXYBUTYNIN (DITROPAN-XL) 10 MG 24 HR TABLET    Take 10 mg by mouth at bedtime.   TIOTROPIUM (SPIRIVA HANDIHALER) 18 MCG INHALATION CAPSULE    Place 1 capsule (18 mcg total) into inhaler and inhale daily.  Modified Medications   Modified Medication Previous Medication   ALPRAZOLAM (XANAX) 0.5 MG TABLET ALPRAZolam (XANAX) 0.5 MG tablet      Take 0.5 mg by mouth 2 (two) times daily. Take one tablet up to twice daily as needed for anxiety  Take one tablet up to twice daily as needed for anxiety  Discontinued Medications   No medications on file     Physical Exam:  Filed Vitals:   09/04/13 1508  BP: 158/80  Pulse: 93  Temp: 98.6 F (37 C)  TempSrc: Oral  Resp: 18  Height: 5\' 4"  (1.626 m)  Weight: 183 lb (83.008 kg)  SpO2: 95%    Physical Exam  Constitutional: She is well-developed, well-nourished, and in no distress.  HENT:  Head: Normocephalic and atraumatic.  Mouth/Throat: Oropharynx is clear and moist. No oropharyngeal exudate.  Eyes: Conjunctivae and EOM are normal. Pupils are equal, round, and reactive to light.  Neck: Normal range of motion. Neck supple.  Cardiovascular: Normal rate, regular rhythm and normal heart sounds.   Pulmonary/Chest: Effort normal and breath sounds normal. No respiratory distress.  Abdominal: Soft. Bowel sounds are normal. She exhibits no distension.  Musculoskeletal: She exhibits no edema and no tenderness.  Neurological: She is alert.  Skin: Skin is warm and dry.  Psychiatric: Her mood appears anxious.     Labs reviewed: Basic Metabolic Panel:  Recent Labs   03/26/13 1706 07/18/13 1041  NA 142 144  K 4.9 4.8  CL 104 102  CO2 22 20  GLUCOSE 99 96  BUN 29* 28*  CREATININE 1.31* 1.14*  CALCIUM 9.8 10.1  TSH 1.150  --    Liver Function Tests:  Recent Labs  03/26/13 1706 07/18/13 1041  AST 17 17  ALT 8 7  ALKPHOS 73 73  BILITOT 0.2 0.5  PROT 6.1 6.4   No results found for this basename: LIPASE, AMYLASE,  in the last 8760 hours No results found for this basename: AMMONIA,  in the last 8760 hours CBC:  Recent Labs  03/26/13 1706  WBC 4.4  NEUTROABS 2.5  HGB 11.3  HCT 34.4  MCV 92  PLT 256   Lipid Panel:  Recent Labs  07/18/13 1041  HDL 58  LDLCALC 77  TRIG 64  CHOLHDL 2.6   TSH:  Recent Labs  03/26/13 1706  TSH 1.150   A1C: Lab Results  Component Value Date   HGBA1C 6.1* 07/18/2013     Assessment/Plan  1. Generalized anxiety disorder -pt with increase anxiety, worry and stress at new living facilty, also with some memory loss that could be contributing to this.  -currently on xanax twice daily as needed but feels like they need something more, will start zoloft at this time - sertraline (ZOLOFT) 50 MG tablet; Start with 25 mg (1/2 tablet) for 1 week then increase to 1 tablet daily for anxiety  Dispense: 30 tablet; Refill: 6 -information given regarding Zoloft and to stop taking and notify if changes in mood, SI, HI occur  2. Essential hypertension -friend will get blood pressure cuff to take blood pressure weekly and record and bring to next visit -goal for sbp to be less than 160  To keep follow up with Dr Chilton SiGreen

## 2013-09-09 ENCOUNTER — Telehealth: Payer: Self-pay

## 2013-09-09 NOTE — Telephone Encounter (Signed)
Message left on triage voicemail: Patient was given medication for anxiety. In the morning patient feels drowsy and at night patient is not resting well. Patient would like a call back to confirm message received.  I called patient back, was unable to leave message (phone rung continually), I will try again later

## 2013-09-10 NOTE — Telephone Encounter (Signed)
Patient called back and said to disregard this message, patient figured out what to do about anxiety medication

## 2013-09-11 DIAGNOSIS — M2140 Flat foot [pes planus] (acquired), unspecified foot: Secondary | ICD-10-CM | POA: Diagnosis not present

## 2013-09-11 DIAGNOSIS — M25579 Pain in unspecified ankle and joints of unspecified foot: Secondary | ICD-10-CM | POA: Diagnosis not present

## 2013-09-17 ENCOUNTER — Ambulatory Visit (INDEPENDENT_AMBULATORY_CARE_PROVIDER_SITE_OTHER): Payer: Medicare Other | Admitting: Internal Medicine

## 2013-09-17 ENCOUNTER — Ambulatory Visit (INDEPENDENT_AMBULATORY_CARE_PROVIDER_SITE_OTHER)
Admission: RE | Admit: 2013-09-17 | Discharge: 2013-09-17 | Disposition: A | Discharge status: Home / Self Care | Payer: Medicare Other | Source: Ambulatory Visit | Attending: Internal Medicine | Admitting: Internal Medicine

## 2013-09-17 ENCOUNTER — Encounter (INDEPENDENT_AMBULATORY_CARE_PROVIDER_SITE_OTHER): Payer: Self-pay

## 2013-09-17 ENCOUNTER — Encounter: Payer: Self-pay | Admitting: Internal Medicine

## 2013-09-17 VITALS — BP 130/80 | HR 85 | Temp 98.0°F | Ht 67.0 in | Wt 180.0 lb

## 2013-09-17 DIAGNOSIS — R058 Other specified cough: Secondary | ICD-10-CM

## 2013-09-17 DIAGNOSIS — R059 Cough, unspecified: Secondary | ICD-10-CM

## 2013-09-17 DIAGNOSIS — I10 Essential (primary) hypertension: Secondary | ICD-10-CM

## 2013-09-17 DIAGNOSIS — R05 Cough: Secondary | ICD-10-CM

## 2013-09-17 DIAGNOSIS — R0602 Shortness of breath: Secondary | ICD-10-CM | POA: Diagnosis not present

## 2013-09-17 HISTORY — DX: Other specified cough: R05.8

## 2013-09-17 MED ORDER — METHYLPREDNISOLONE ACETATE 80 MG/ML IJ SUSP
120.0000 mg | Freq: Once | INTRAMUSCULAR | Status: AC
  Administered 2013-09-17: 120 mg via INTRAMUSCULAR

## 2013-09-17 MED ORDER — TELMISARTAN-HCTZ 80-12.5 MG PO TABS: 1.0000 | ORAL_TABLET | Freq: Every day | ORAL | Status: DC

## 2013-09-17 NOTE — Patient Instructions (Addendum)
Stop spiriva, advair, and losartan  Depomedrol 120 mg today   micardis 80/12.5 one daily   prilosec 40 Take 30- 60 min before your first and last meals of the day   GERD (REFLUX)  is an extremely common cause of respiratory symptoms, many times with no significant heartburn at all.    It can be treated with medication, but also with lifestyle changes including avoidance of late meals, excessive alcohol, smoking cessation, and avoid fatty foods, chocolate, peppermint, colas, red wine, and acidic juices such as orange juice.  NO MINT OR MENTHOL PRODUCTS SO NO COUGH DROPS  USE SUGARLESS CANDY INSTEAD (jolley ranchers or Stover's)  NO OIL BASED VITAMINS - use powdered substitutes.    Only use your albuterol (proair) as a rescue medication to be used if you can't catch your breath by resting or doing a relaxed purse lip breathing pattern.  - The less you use it, the better it will work when you need it. - Ok to use up to 2 puffs  every 4 hours if you must but call for immediate appointment if use goes up over your usual need - Don't leave home without it !!  (think of it like the spare tire for your car)   Work on inhaler technique:  relax and gently blow all the way out then take a nice smooth deep breath back in, triggering the inhaler at same time you start breathing in.  Hold for up to 5 seconds if you can.  Rinse and gargle with water when done   If can't learn  Technique may need to use albuterol as needed per neb.     GERD (REFLUX)  is an extremely common cause of respiratory symptoms, many times with no significant heartburn at all.    It can be treated with medication, but also with lifestyle changes including avoidance of late meals, excessive alcohol, smoking cessation, and avoid fatty foods, chocolate, peppermint, colas, red wine, and acidic juices such as orange juice.  NO MINT OR MENTHOL PRODUCTS SO NO COUGH DROPS  USE SUGARLESS CANDY INSTEAD (jolley ranchers or Stover's)  NO  OIL BASED VITAMINS - use powdered substitutes.    Please remember to go to the x-ray department downstairs for your tests - we will call you with the results when they are available.  Please schedule a follow up office visit in 4 weeks, sooner if needed to see Tammy NP

## 2013-09-17 NOTE — Progress Notes (Signed)
Quick Note:  LMTCB ______ 

## 2013-09-17 NOTE — Progress Notes (Signed)
   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 Pulmonary Consult    Referred by Dr. Murray HodgkinsArthur Green for SOB with exertion   no sob sitting still  Sleeps ok on 3 pillows or has bad HB Cough more day than night more dry than wet Swallowing better since botox injection one week prior to OV   ? Some better with hfa saba/ worse with dpi spiriva/advair   No obvious other patterns in day to day or daytime variabilty or assoc   cp or chest tightness, subjective wheeze overt sinus or hb symptoms. No unusual exp hx or h/o childhood pna/ asthma or knowledge of premature birth.  Sleeping ok without nocturnal  or early am exacerbation  of respiratory  c/o's or need for noct saba. Also denies any obvious fluctuation of symptoms with weather or environmental changes or other aggravating or alleviating factors except as outlined above   Current Medications, Allergies, Complete Past Medical History, Past Surgical History, Family History, and Social History were reviewed in [facility] electronic medical record.            Review of Systems  Constitutional: Negative for fever and unexpected weight change.  HENT: Positive for trouble swallowing. Negative for congestion, dental problem, ear pain, nosebleeds, postnasal drip, rhinorrhea, sinus pressure, sneezing and sore throat.   Eyes: Negative for redness and itching.  Respiratory: Positive for cough, chest tightness and shortness of breath. Negative for wheezing.   Cardiovascular: Negative for palpitations and leg swelling.  Gastrointestinal: Negative for nausea and vomiting.  Genitourinary: Negative for dysuria.  Musculoskeletal:  Negative for joint swelling.  Skin: Negative for rash.  Neurological: Negative for headaches.  Hematological: Does not bruise/bleed easily.  Psychiatric/Behavioral: Positive for dysphoric mood. The patient is nervous/anxious.        Objective:   Physical Exam  Wt Readings from Last 3 Encounters:  09/17/13 180 lb (81.647 kg)  09/04/13 183 lb (83.008 kg)  09/03/13 188 lb (85.276 kg)      HEENT: nl dentition, turbinates, and orophanx. Nl external ear canals without cough reflex   NECK :  without JVD/Nodes/TM/ nl carotid upstrokes bilaterally   LUNGS: no acc muscle use, clear to A and P bilaterally without cough on insp or exp maneuvers   CV:  RRR  no s3 or murmur or increase in P2, no edema   ABD:  soft and nontender with nl excursion in the supine position. No bruits or organomegaly, bowel sounds nl  MS:  warm without deformities, calf tenderness, cyanosis or clubbing  SKIN: warm and dry without lesions    NEURO:  alert, approp, no deficits   CXR  09/17/2013 : There is cardiomegaly without edema. The lungs are clear. No  pneumothorax or pleural effusion is seen. No focal bony abnormality  is identified.            Assessment & Plan:

## 2013-09-17 NOTE — Assessment & Plan Note (Addendum)
The most common causes of chronic cough in immunocompetent adults include the following: upper airway cough syndrome (UACS), previously referred to as postnasal drip syndrome (PNDS), which is caused by variety of rhinosinus conditions; (2) asthma; (3) GERD; (4) chronic bronchitis from cigarette smoking or other inhaled environmental irritants; (5) nonasthmatic eosinophilic bronchitis; and (6) bronchiectasis.   These conditions, singly or in combination, have accounted for up to 94% of the causes of chronic cough in prospective studies.   Other conditions have constituted no >6% of the causes in prospective studies These have included bronchogenic carcinoma, chronic interstitial pneumonia, sarcoidosis, left ventricular failure, ACEI-induced cough (now being reported also with generic cozar), and aspiration from a condition associated with pharyngeal dysfunction.    Chronic cough is often simultaneously caused by more than one condition. A single cause has been found from 38 to 82% of the time, multiple causes from 18 to 62%. Multiply caused cough has been the result of three diseases up to 42% of the time.       Based on hx and exam, this is most likely:  Classic Upper airway cough syndrome, so named because it's frequently impossible to sort out how much is  CR/sinusitis with freq throat clearing (which can be related to primary GERD)   vs  causing  secondary (" extra esophageal")  GERD from wide swings in gastric pressure that occur with throat clearing, often  promoting self use of mint and menthol lozenges that reduce the lower esophageal sphincter tone and exacerbate the problem further in a cyclical fashion.   These are the same pts (now being labeled as having "irritable larynx syndrome" by some cough centers) who not infrequently have a history of having failed to tolerate ace inhibitors,  dry powder inhalers or biphosphonates or report having atypical reflux symptoms that don't respond to  standard doses of PPI , and are easily confused as having aecopd or asthma flares by even experienced allergists/ pulmonologists.   The first steps are  1) d/c all dpi 2) max rx for acid and non -acid gerd 3) try alternative to losartan (see hbp)

## 2013-09-18 ENCOUNTER — Telehealth: Payer: Self-pay | Admitting: Internal Medicine

## 2013-09-18 NOTE — Telephone Encounter (Signed)
PT ALSO WANTS TO KNOW THE RESULTS OF HER CHEST XRAY FROM YESTERDAY they are also wanting to know about her losartin this is what the previous call this morning was about she is very confused please talk to the [employer] [place] [phone]

## 2013-09-18 NOTE — Telephone Encounter (Signed)
Return call.Stanley A Dalton °

## 2013-09-18 NOTE — Telephone Encounter (Signed)
LMTCB for Christine Pierce

## 2013-09-18 NOTE — Telephone Encounter (Signed)
Notes Recorded by Nyoka CowdenMichael B Wert, MD on 09/17/2013 at 5:05 PM Call pt: Reviewed cxr and no acute change so [facility]  Spoke with Clarisse. Made aware of results. Nothing further needed

## 2013-09-18 NOTE — Telephone Encounter (Signed)
ATC PT at #'s above but line busy x 2 wcb

## 2013-09-18 NOTE — Progress Notes (Signed)
Quick Note:  Spoke with pt and notified of results per Dr. Wert. Pt verbalized understanding and denied any questions.  ______ 

## 2013-09-19 ENCOUNTER — Telehealth: Payer: Self-pay | Admitting: Internal Medicine

## 2013-09-19 NOTE — Telephone Encounter (Signed)
I received fax and since MW out of the office, I have stamped with his sig  I have faxed this back to [facility]

## 2013-09-19 NOTE — Assessment & Plan Note (Signed)
For reasons that may related to vascular permability and nitric oxide pathways but not elevated  bradykinin levels (as seen with  ACEi use) losartan in the generic form has been reported now from mulitple sources  to cause a similar pattern of non-specific  upper airway symptoms as seen with acei.   This has not been reported with exposure to the other ARB's to date, so it seems reasonable for now to try either generic diovan or avapro if ARB needed or use an alternative class altogether.  See:  Dewayne HatchAnn Allergy Asthma Immunol  2008: 101: p [phone]    Try micardis 80/12.5 one daily and regroup in 4 weeks

## 2013-09-19 NOTE — Telephone Encounter (Signed)
Unable to use Rx sent for prilosec because this isn't signed.  Faxed something over from [facility] on 7/15.  Ok to just sign the paper, no need to call back.  Antionette FairyHolly D Pryor

## 2013-09-19 NOTE — Telephone Encounter (Signed)
LMTCB for [employer]

## 2013-09-24 DIAGNOSIS — H906 Mixed conductive and sensorineural hearing loss, bilateral: Secondary | ICD-10-CM | POA: Diagnosis not present

## 2013-09-24 DIAGNOSIS — H903 Sensorineural hearing loss, bilateral: Secondary | ICD-10-CM | POA: Diagnosis not present

## 2013-09-24 DIAGNOSIS — H905 Unspecified sensorineural hearing loss: Secondary | ICD-10-CM | POA: Diagnosis not present

## 2013-09-30 DIAGNOSIS — M47817 Spondylosis without myelopathy or radiculopathy, lumbosacral region: Secondary | ICD-10-CM | POA: Diagnosis not present

## 2013-09-30 DIAGNOSIS — M5430 Sciatica, unspecified side: Secondary | ICD-10-CM | POA: Diagnosis not present

## 2013-09-30 DIAGNOSIS — E119 Type 2 diabetes mellitus without complications: Secondary | ICD-10-CM | POA: Diagnosis not present

## 2013-09-30 DIAGNOSIS — Z9181 History of falling: Secondary | ICD-10-CM | POA: Diagnosis not present

## 2013-09-30 DIAGNOSIS — G609 Hereditary and idiopathic neuropathy, unspecified: Secondary | ICD-10-CM | POA: Diagnosis not present

## 2013-09-30 DIAGNOSIS — M79609 Pain in unspecified limb: Secondary | ICD-10-CM | POA: Diagnosis not present

## 2013-09-30 DIAGNOSIS — I129 Hypertensive chronic kidney disease with stage 1 through stage 4 chronic kidney disease, or unspecified chronic kidney disease: Secondary | ICD-10-CM

## 2013-09-30 DIAGNOSIS — N183 Chronic kidney disease, stage 3 unspecified: Secondary | ICD-10-CM | POA: Diagnosis not present

## 2013-09-30 DIAGNOSIS — B351 Tinea unguium: Secondary | ICD-10-CM | POA: Diagnosis not present

## 2013-09-30 DIAGNOSIS — J441 Chronic obstructive pulmonary disease with (acute) exacerbation: Secondary | ICD-10-CM | POA: Diagnosis not present

## 2013-09-30 DIAGNOSIS — F411 Generalized anxiety disorder: Secondary | ICD-10-CM

## 2013-09-30 DIAGNOSIS — M2010 Hallux valgus (acquired), unspecified foot: Secondary | ICD-10-CM | POA: Diagnosis not present

## 2013-09-30 DIAGNOSIS — L600 Ingrowing nail: Secondary | ICD-10-CM | POA: Diagnosis not present

## 2013-09-30 DIAGNOSIS — M48061 Spinal stenosis, lumbar region without neurogenic claudication: Secondary | ICD-10-CM

## 2013-10-01 DIAGNOSIS — G609 Hereditary and idiopathic neuropathy, unspecified: Secondary | ICD-10-CM | POA: Diagnosis not present

## 2013-10-01 DIAGNOSIS — F411 Generalized anxiety disorder: Secondary | ICD-10-CM | POA: Diagnosis not present

## 2013-10-01 DIAGNOSIS — J441 Chronic obstructive pulmonary disease with (acute) exacerbation: Secondary | ICD-10-CM | POA: Diagnosis not present

## 2013-10-01 DIAGNOSIS — N183 Chronic kidney disease, stage 3 unspecified: Secondary | ICD-10-CM | POA: Diagnosis not present

## 2013-10-01 DIAGNOSIS — I129 Hypertensive chronic kidney disease with stage 1 through stage 4 chronic kidney disease, or unspecified chronic kidney disease: Secondary | ICD-10-CM | POA: Diagnosis not present

## 2013-10-01 DIAGNOSIS — M47817 Spondylosis without myelopathy or radiculopathy, lumbosacral region: Secondary | ICD-10-CM | POA: Diagnosis not present

## 2013-10-03 DIAGNOSIS — N183 Chronic kidney disease, stage 3 unspecified: Secondary | ICD-10-CM | POA: Diagnosis not present

## 2013-10-03 DIAGNOSIS — F411 Generalized anxiety disorder: Secondary | ICD-10-CM | POA: Diagnosis not present

## 2013-10-03 DIAGNOSIS — I129 Hypertensive chronic kidney disease with stage 1 through stage 4 chronic kidney disease, or unspecified chronic kidney disease: Secondary | ICD-10-CM | POA: Diagnosis not present

## 2013-10-03 DIAGNOSIS — J441 Chronic obstructive pulmonary disease with (acute) exacerbation: Secondary | ICD-10-CM | POA: Diagnosis not present

## 2013-10-03 DIAGNOSIS — M47817 Spondylosis without myelopathy or radiculopathy, lumbosacral region: Secondary | ICD-10-CM | POA: Diagnosis not present

## 2013-10-03 DIAGNOSIS — G609 Hereditary and idiopathic neuropathy, unspecified: Secondary | ICD-10-CM | POA: Diagnosis not present

## 2013-10-06 DIAGNOSIS — M47817 Spondylosis without myelopathy or radiculopathy, lumbosacral region: Secondary | ICD-10-CM | POA: Diagnosis not present

## 2013-10-06 DIAGNOSIS — I129 Hypertensive chronic kidney disease with stage 1 through stage 4 chronic kidney disease, or unspecified chronic kidney disease: Secondary | ICD-10-CM | POA: Diagnosis not present

## 2013-10-06 DIAGNOSIS — F411 Generalized anxiety disorder: Secondary | ICD-10-CM | POA: Diagnosis not present

## 2013-10-06 DIAGNOSIS — G609 Hereditary and idiopathic neuropathy, unspecified: Secondary | ICD-10-CM | POA: Diagnosis not present

## 2013-10-06 DIAGNOSIS — J441 Chronic obstructive pulmonary disease with (acute) exacerbation: Secondary | ICD-10-CM | POA: Diagnosis not present

## 2013-10-06 DIAGNOSIS — N183 Chronic kidney disease, stage 3 unspecified: Secondary | ICD-10-CM | POA: Diagnosis not present

## 2013-10-07 DIAGNOSIS — F411 Generalized anxiety disorder: Secondary | ICD-10-CM | POA: Diagnosis not present

## 2013-10-07 DIAGNOSIS — J441 Chronic obstructive pulmonary disease with (acute) exacerbation: Secondary | ICD-10-CM | POA: Diagnosis not present

## 2013-10-07 DIAGNOSIS — M47817 Spondylosis without myelopathy or radiculopathy, lumbosacral region: Secondary | ICD-10-CM | POA: Diagnosis not present

## 2013-10-07 DIAGNOSIS — N183 Chronic kidney disease, stage 3 unspecified: Secondary | ICD-10-CM | POA: Diagnosis not present

## 2013-10-07 DIAGNOSIS — G609 Hereditary and idiopathic neuropathy, unspecified: Secondary | ICD-10-CM | POA: Diagnosis not present

## 2013-10-07 DIAGNOSIS — I129 Hypertensive chronic kidney disease with stage 1 through stage 4 chronic kidney disease, or unspecified chronic kidney disease: Secondary | ICD-10-CM | POA: Diagnosis not present

## 2013-10-08 DIAGNOSIS — I129 Hypertensive chronic kidney disease with stage 1 through stage 4 chronic kidney disease, or unspecified chronic kidney disease: Secondary | ICD-10-CM | POA: Diagnosis not present

## 2013-10-08 DIAGNOSIS — M47817 Spondylosis without myelopathy or radiculopathy, lumbosacral region: Secondary | ICD-10-CM | POA: Diagnosis not present

## 2013-10-08 DIAGNOSIS — F411 Generalized anxiety disorder: Secondary | ICD-10-CM | POA: Diagnosis not present

## 2013-10-08 DIAGNOSIS — N183 Chronic kidney disease, stage 3 unspecified: Secondary | ICD-10-CM | POA: Diagnosis not present

## 2013-10-08 DIAGNOSIS — J441 Chronic obstructive pulmonary disease with (acute) exacerbation: Secondary | ICD-10-CM | POA: Diagnosis not present

## 2013-10-08 DIAGNOSIS — G609 Hereditary and idiopathic neuropathy, unspecified: Secondary | ICD-10-CM | POA: Diagnosis not present

## 2013-10-09 DIAGNOSIS — I129 Hypertensive chronic kidney disease with stage 1 through stage 4 chronic kidney disease, or unspecified chronic kidney disease: Secondary | ICD-10-CM | POA: Diagnosis not present

## 2013-10-09 DIAGNOSIS — N183 Chronic kidney disease, stage 3 unspecified: Secondary | ICD-10-CM | POA: Diagnosis not present

## 2013-10-09 DIAGNOSIS — J441 Chronic obstructive pulmonary disease with (acute) exacerbation: Secondary | ICD-10-CM | POA: Diagnosis not present

## 2013-10-09 DIAGNOSIS — F411 Generalized anxiety disorder: Secondary | ICD-10-CM | POA: Diagnosis not present

## 2013-10-09 DIAGNOSIS — M47817 Spondylosis without myelopathy or radiculopathy, lumbosacral region: Secondary | ICD-10-CM | POA: Diagnosis not present

## 2013-10-09 DIAGNOSIS — G609 Hereditary and idiopathic neuropathy, unspecified: Secondary | ICD-10-CM | POA: Diagnosis not present

## 2013-10-10 DIAGNOSIS — N183 Chronic kidney disease, stage 3 unspecified: Secondary | ICD-10-CM | POA: Diagnosis not present

## 2013-10-10 DIAGNOSIS — I129 Hypertensive chronic kidney disease with stage 1 through stage 4 chronic kidney disease, or unspecified chronic kidney disease: Secondary | ICD-10-CM | POA: Diagnosis not present

## 2013-10-10 DIAGNOSIS — G609 Hereditary and idiopathic neuropathy, unspecified: Secondary | ICD-10-CM | POA: Diagnosis not present

## 2013-10-10 DIAGNOSIS — F411 Generalized anxiety disorder: Secondary | ICD-10-CM | POA: Diagnosis not present

## 2013-10-10 DIAGNOSIS — J441 Chronic obstructive pulmonary disease with (acute) exacerbation: Secondary | ICD-10-CM | POA: Diagnosis not present

## 2013-10-10 DIAGNOSIS — M47817 Spondylosis without myelopathy or radiculopathy, lumbosacral region: Secondary | ICD-10-CM | POA: Diagnosis not present

## 2013-10-14 DIAGNOSIS — N183 Chronic kidney disease, stage 3 unspecified: Secondary | ICD-10-CM | POA: Diagnosis not present

## 2013-10-14 DIAGNOSIS — G609 Hereditary and idiopathic neuropathy, unspecified: Secondary | ICD-10-CM | POA: Diagnosis not present

## 2013-10-14 DIAGNOSIS — I129 Hypertensive chronic kidney disease with stage 1 through stage 4 chronic kidney disease, or unspecified chronic kidney disease: Secondary | ICD-10-CM | POA: Diagnosis not present

## 2013-10-14 DIAGNOSIS — M47817 Spondylosis without myelopathy or radiculopathy, lumbosacral region: Secondary | ICD-10-CM | POA: Diagnosis not present

## 2013-10-14 DIAGNOSIS — F411 Generalized anxiety disorder: Secondary | ICD-10-CM | POA: Diagnosis not present

## 2013-10-14 DIAGNOSIS — J441 Chronic obstructive pulmonary disease with (acute) exacerbation: Secondary | ICD-10-CM | POA: Diagnosis not present

## 2013-10-15 DIAGNOSIS — M47817 Spondylosis without myelopathy or radiculopathy, lumbosacral region: Secondary | ICD-10-CM | POA: Diagnosis not present

## 2013-10-15 DIAGNOSIS — N183 Chronic kidney disease, stage 3 unspecified: Secondary | ICD-10-CM | POA: Diagnosis not present

## 2013-10-15 DIAGNOSIS — G609 Hereditary and idiopathic neuropathy, unspecified: Secondary | ICD-10-CM | POA: Diagnosis not present

## 2013-10-15 DIAGNOSIS — J441 Chronic obstructive pulmonary disease with (acute) exacerbation: Secondary | ICD-10-CM | POA: Diagnosis not present

## 2013-10-15 DIAGNOSIS — I129 Hypertensive chronic kidney disease with stage 1 through stage 4 chronic kidney disease, or unspecified chronic kidney disease: Secondary | ICD-10-CM | POA: Diagnosis not present

## 2013-10-15 DIAGNOSIS — F411 Generalized anxiety disorder: Secondary | ICD-10-CM | POA: Diagnosis not present

## 2013-10-16 ENCOUNTER — Ambulatory Visit (INDEPENDENT_AMBULATORY_CARE_PROVIDER_SITE_OTHER): Payer: Medicare Other | Admitting: Adult Health

## 2013-10-16 ENCOUNTER — Encounter: Payer: Self-pay | Admitting: Adult Health

## 2013-10-16 VITALS — BP 128/74 | HR 85 | Temp 98.5°F | Ht 67.0 in | Wt 177.4 lb

## 2013-10-16 DIAGNOSIS — R059 Cough, unspecified: Secondary | ICD-10-CM | POA: Diagnosis not present

## 2013-10-16 DIAGNOSIS — R05 Cough: Secondary | ICD-10-CM

## 2013-10-16 DIAGNOSIS — R058 Other specified cough: Secondary | ICD-10-CM

## 2013-10-16 NOTE — Patient Instructions (Signed)
Remain on current regimen  Follow up Dr. Sherene SiresWert  In 3-4 weeks with PFT  Please contact office for sooner follow up if symptoms do not improve or worsen or seek emergency care

## 2013-10-17 DIAGNOSIS — J441 Chronic obstructive pulmonary disease with (acute) exacerbation: Secondary | ICD-10-CM | POA: Diagnosis not present

## 2013-10-17 DIAGNOSIS — F411 Generalized anxiety disorder: Secondary | ICD-10-CM | POA: Diagnosis not present

## 2013-10-17 DIAGNOSIS — N183 Chronic kidney disease, stage 3 unspecified: Secondary | ICD-10-CM | POA: Diagnosis not present

## 2013-10-17 DIAGNOSIS — M47817 Spondylosis without myelopathy or radiculopathy, lumbosacral region: Secondary | ICD-10-CM | POA: Diagnosis not present

## 2013-10-17 DIAGNOSIS — G609 Hereditary and idiopathic neuropathy, unspecified: Secondary | ICD-10-CM | POA: Diagnosis not present

## 2013-10-17 DIAGNOSIS — I129 Hypertensive chronic kidney disease with stage 1 through stage 4 chronic kidney disease, or unspecified chronic kidney disease: Secondary | ICD-10-CM | POA: Diagnosis not present

## 2013-10-19 NOTE — Progress Notes (Signed)
   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 Pulmonary Consult    Referred by Dr. Murray HodgkinsArthur Green for SOB with exertion   no sob sitting still  Sleeps ok on 3 pillows or has bad HB Cough more day than night more dry than wet Swallowing better since botox injection one week prior to OV   ? Some better with hfa saba/ worse with dpi spiriva/advair  >>  10/16/13 Follow up  Pt was seen for pulmonary consult 1 month for for DOE and cough .  Has known swallow issues.  She is a never smoker . She had a dx of COPD, last ov she was taken off of Advair and spiriva .  Losartan was changed to micardis hct .  Since last ov she is feeling better.  Reports DOE and hoarseness is 75% improved since last ov..  No hemopytsis , chest pain, increased edema or fever.   Current Medications, Allergies, Complete Past Medical History, Past Surgical History, Family History, and Social History were reviewed in [facility] electronic medical record.     Review of Systems  Constitutional: Negative for fever and unexpected weight change.  HENT:. Negative for congestion, dental problem, ear pain, nosebleeds, postnasal drip, rhinorrhea, sinus pressure, sneezing and sore throat.   Eyes: Negative for redness and itching.  Respiratory: Positive for cough. Negative for wheezing.   Cardiovascular: Negative for palpitations and leg swelling.  Gastrointestinal: Negative for nausea and vomiting.  Genitourinary: Negative for dysuria.  Musculoskeletal: Negative for joint swelling.  Skin: Negative for rash.  Neurological: Negative for headaches.  Hematological: Does not bruise/bleed easily.    Psychiatric/Behavioral:  The patient is nervous/anxious.        Objective:   Physical Exam      HEENT: nl dentition, turbinates, and orophanx. Nl external ear canals without cough reflex   NECK :  without JVD/Nodes/TM/ nl carotid upstrokes bilaterally   LUNGS: no acc muscle use, clear to A and P bilaterally without cough on insp or exp maneuvers   CV:  RRR  no s3 or murmur or increase in P2, no edema   ABD:  soft and nontender with nl excursion in the supine position. No bruits or organomegaly, bowel sounds nl  MS:  warm without deformities, calf tenderness, cyanosis or clubbing  SKIN: warm and dry without lesions    NEURO:  alert, approp, no deficits   CXR  09/17/2013 : There is cardiomegaly without edema. The lungs are clear. No  pneumothorax or pleural effusion is seen. No focal bony abnormality  is identified.            Assessment & Plan:

## 2013-10-19 NOTE — Assessment & Plan Note (Addendum)
Improved on current regimen  Needs PFTs Plan  Remain on current regimen  Follow up Dr. Sherene SiresWert  In 3-4 weeks with PFT  Please contact office for sooner follow up if symptoms do not improve or worsen or seek emergency care

## 2013-10-20 DIAGNOSIS — M47817 Spondylosis without myelopathy or radiculopathy, lumbosacral region: Secondary | ICD-10-CM | POA: Diagnosis not present

## 2013-10-20 DIAGNOSIS — J441 Chronic obstructive pulmonary disease with (acute) exacerbation: Secondary | ICD-10-CM | POA: Diagnosis not present

## 2013-10-20 DIAGNOSIS — G609 Hereditary and idiopathic neuropathy, unspecified: Secondary | ICD-10-CM | POA: Diagnosis not present

## 2013-10-20 DIAGNOSIS — F411 Generalized anxiety disorder: Secondary | ICD-10-CM | POA: Diagnosis not present

## 2013-10-20 DIAGNOSIS — N183 Chronic kidney disease, stage 3 unspecified: Secondary | ICD-10-CM | POA: Diagnosis not present

## 2013-10-20 DIAGNOSIS — I129 Hypertensive chronic kidney disease with stage 1 through stage 4 chronic kidney disease, or unspecified chronic kidney disease: Secondary | ICD-10-CM | POA: Diagnosis not present

## 2013-10-21 ENCOUNTER — Encounter: Payer: Self-pay | Admitting: Internal Medicine

## 2013-10-21 ENCOUNTER — Ambulatory Visit (INDEPENDENT_AMBULATORY_CARE_PROVIDER_SITE_OTHER): Payer: Medicare Other | Admitting: Internal Medicine

## 2013-10-21 VITALS — BP 122/70 | HR 83 | Temp 97.4°F | Resp 10 | Ht 65.0 in | Wt 176.0 lb

## 2013-10-21 DIAGNOSIS — K5900 Constipation, unspecified: Secondary | ICD-10-CM

## 2013-10-21 DIAGNOSIS — E669 Obesity, unspecified: Secondary | ICD-10-CM

## 2013-10-21 DIAGNOSIS — R109 Unspecified abdominal pain: Secondary | ICD-10-CM | POA: Diagnosis not present

## 2013-10-21 DIAGNOSIS — R059 Cough, unspecified: Secondary | ICD-10-CM

## 2013-10-21 DIAGNOSIS — K602 Anal fissure, unspecified: Secondary | ICD-10-CM

## 2013-10-21 DIAGNOSIS — F411 Generalized anxiety disorder: Secondary | ICD-10-CM

## 2013-10-21 DIAGNOSIS — R413 Other amnesia: Secondary | ICD-10-CM

## 2013-10-21 DIAGNOSIS — R1310 Dysphagia, unspecified: Secondary | ICD-10-CM

## 2013-10-21 DIAGNOSIS — E119 Type 2 diabetes mellitus without complications: Secondary | ICD-10-CM

## 2013-10-21 DIAGNOSIS — R0602 Shortness of breath: Secondary | ICD-10-CM

## 2013-10-21 DIAGNOSIS — R05 Cough: Secondary | ICD-10-CM

## 2013-10-21 DIAGNOSIS — R251 Tremor, unspecified: Secondary | ICD-10-CM

## 2013-10-21 DIAGNOSIS — R259 Unspecified abnormal involuntary movements: Secondary | ICD-10-CM

## 2013-10-21 DIAGNOSIS — R058 Other specified cough: Secondary | ICD-10-CM

## 2013-10-21 HISTORY — DX: Tremor, unspecified: R25.1

## 2013-10-21 HISTORY — DX: Constipation, unspecified: K59.00

## 2013-10-21 HISTORY — DX: Generalized anxiety disorder: F41.1

## 2013-10-21 HISTORY — DX: Anal fissure, unspecified: K60.2

## 2013-10-21 MED ORDER — CITALOPRAM HYDROBROMIDE 20 MG PO TABS: 20.0000 mg | ORAL_TABLET | Freq: Every day | ORAL | Status: DC

## 2013-10-21 MED ORDER — SENNOSIDES-DOCUSATE SODIUM 8.6-50 MG PO TABS: ORAL_TABLET | ORAL | Status: DC

## 2013-10-21 NOTE — Progress Notes (Signed)
Patient ID: Christine Pierce, female   DOB: 1921-07-06, 80 y.o.   MRN: [id number]    Location:    [facility]  Place of Service:  OFFICE    Allergies  Allergen Reactions  . Macrodantin [Nitrofurantoin Macrocrystal]     Affects kidneys    Chief Complaint  Patient presents with  . Medical Management of Chronic Issues    3 month follow-up, no recent labs   . Rectal Bleeding    2-3 episodes  . Tremors    Hand Tremors  . URI    Constant cold sympotoms     HPI:  Pain on the left side has returned and is moderately severe.Comes and goes. No inciting activity that she has identified.  Blood in stool. She thinks it might be hemorrhoidal.  Tremor in the right hand noted about 4 weeks ago. Left hand tremor noted about 2 weeks ago.  Persistent "cold". No expectoration. Does not really have any cold symptomsTaking medications like Mucinex. Used Coricidin in the past.  Advair diskus and Spiriva Inhaler was stopped July 2015 by Dr. Sherene Sires. Now has a breathing problem on exertion. He is not convinced she has COPD. She has Albuterol, but is not using. Has follow up appt with Dr. Sherene Sires 12/02/13 with Breathing studies. Also has appt with Dr. Andee Poles weak after any exertion. Denies palpitations or pain.  Medications: Patient's Medications  New Prescriptions   No medications on file  Previous Medications   ACETAMINOPHEN (TYLENOL) 325 MG TABLET    Take 650 mg by mouth every 4 (four) hours as needed for pain.   ALBUTEROL (PROVENTIL HFA;VENTOLIN HFA) 108 (90 BASE) MCG/ACT INHALER    Inhale 2 puffs into the lungs every 6 (six) hours as needed for wheezing.   ALPRAZOLAM (XANAX) 0.5 MG TABLET    Take 1 tablet (0.5 mg total) by mouth 2 (two) times daily as needed for anxiety.   ASPIRIN 81 MG CHEWABLE TABLET    Chew 81 mg by mouth daily.   DEXTROMETHORPHAN-GUAIFENESIN (MUCINEX DM) 30-600 MG PER 12 HR TABLET    Take 1 tablet by mouth 2 (two) times daily as needed for cough.   HYDROCORTISONE CREAM 1 %     Apply to pruritic or irritated areas of legs twice daily as needed   LOVASTATIN (MEVACOR) 40 MG TABLET    Take 40 mg by mouth every morning.   MELATONIN 3 MG CAPS    Take 3 mg by mouth at bedtime as needed (Sleep).   MISC. DEVICES (ROLLER WALKER) MISC    Rolling Walker with seat and brakes.   MULTIPLE VITAMIN (MULTIVITAMIN WITH MINERALS) TABS TABLET    Take 1 tablet by mouth daily.   NITROGLYCERIN (NITROSTAT) 0.4 MG SL TABLET    Place 0.4 mg under the tongue every 5 (five) minutes as needed for chest pain.   OMEPRAZOLE (PRILOSEC) 20 MG CAPSULE    Take 20 mg by mouth 2 (two) times daily before a meal.    OXYBUTYNIN (DITROPAN-XL) 10 MG 24 HR TABLET    Take 10 mg by mouth at bedtime.   SERTRALINE (ZOLOFT) 50 MG TABLET    Start with 25 mg (1/2 tablet) for 1 week then increase to 1 tablet daily for anxiety   TELMISARTAN-HYDROCHLOROTHIAZIDE (MICARDIS HCT) 80-12.5 MG PER TABLET    Take 1 tablet by mouth daily.  Modified Medications   No medications on file  Discontinued Medications   No medications on file     Review  of Systems  Constitutional: Positive for fatigue. Negative for fever, chills, diaphoresis, activity change, appetite change and unexpected weight change.  HENT: Positive for hearing loss.   Eyes: Negative.   Respiratory: Positive for cough and shortness of breath.        Complains of a hoarse voice for the last few months. There is no pain.  Cardiovascular: Positive for chest pain (Recent chest discomfort resolved within less than one half hour and has not recurred.) and leg swelling. Negative for palpitations.  Gastrointestinal: Positive for constipation, blood in stool and rectal pain.       Occasional fecal incontinence. Sometimes has difficulty swallowing. Solids and liquids seem to be involved. There is no odynophagia.  Endocrine:       Elevated blood sugars. Currently on "dietary control". She is not following her diet closely.  Genitourinary: Positive for urgency and  frequency. Negative for dysuria, decreased urine volume, vaginal bleeding, vaginal discharge, enuresis, genital sores, vaginal pain and pelvic pain.  Musculoskeletal:       Generalized weakness: Pain in the right hip area. Requires assistance in getting up and down. At risk for falls.  Skin: Negative.   Allergic/Immunologic: Negative.   Neurological: Positive for weakness.       Memory loss.  Hematological: Negative.   Psychiatric/Behavioral: Positive for confusion. The patient is nervous/anxious.        History of hoarding behavior. Impulsive buying of products on television. Some agitation about recent episode of chest discomfort and elevated blood pressure, which she feels may not have been handled appropriately.    Filed Vitals:   10/21/13 1323  BP: 122/70  Pulse: 83  Temp: 97.4 F (36.3 C)  TempSrc: Oral  Resp: 10  Height: 5\' 5"  (1.651 m)  Weight: 176 lb (79.833 kg)  SpO2: 96%   Body mass index is 29.29 kg/(m^2).  Physical Exam  Constitutional: She is oriented to person, place, and time. No distress.  Overweight  HENT:  Head: Atraumatic.  Partial deafness. Uses hearing aids.  Eyes:  Wears prescription lenses.  Neck: No JVD present. No tracheal deviation present. No thyromegaly present.  Some tenderness with movement laterally to either side.  Cardiovascular: Normal rate and regular rhythm.  Exam reveals no friction rub.   Murmur (3/6 SEM at LSB and aortic ejection) heard. Bilateral varicose veins  Pulmonary/Chest: No respiratory distress. She has no wheezes. She has no rales. She exhibits no tenderness.  Tender left flank area lower rib margins laterally and slightly anteriorly. Voice is slightly hoarse.  Abdominal: Soft. Bowel sounds are normal. She exhibits no distension and no mass. There is no tenderness.  Genitourinary: Guaiac negative stool.  Anal fissure at 6 o'clock  Musculoskeletal: Normal range of motion. She exhibits no edema and no tenderness.  Tender  the greater trochanter and right groin. She is hobbled by this decrease in her stride length.  Lymphadenopathy:    She has no cervical adenopathy.  Neurological: She is alert and oriented to person, place, and time. No cranial nerve deficit. She exhibits normal muscle tone. Coordination normal.  04/30/12 MMSE 27/30. Passed clock drawing. Having some trouble in sequencing events and in telling a coherent story.  Insensitive to vibration in the feet.  Skin: No rash noted. She is not diaphoretic. No erythema. No pallor.  Psychiatric: She has a normal mood and affect. Her behavior is normal. Judgment and thought content normal.     Labs reviewed: Admission on 09/03/2013, Discharged on 09/03/2013  Component Date  Value Ref Range Status  . Glucose-Capillary 09/03/2013 93  70 - 99 mg/dL Final  Office Visit on 07/22/2013  Component Date Value Ref Range Status  . Creatinine, Ur 07/22/2013 66.3  15.0 - 278.0 mg/dL Final  . Microalbum.,U,Random 07/22/2013 3.3  0.0 - 17.0 ug/mL Final   **Verified by repeat analysis**  . MICROALB/CREAT RATIO 07/22/2013 5.0  0.0 - 30.0 mg/g creat Final     Assessment/Plan 1. Anal fissure Add Senokot-S  2. Abdominal  pain, other specified site LUQ discomfort with deep palpation. No mass  3. Dysphagia, unspecified(787.20) Seeing Dr. Dulce Sellar  4. Memory deficits MMSE score 29/30  5. Shortness of breath Only on exertion  6. Type II or unspecified type diabetes mellitus without mention of complication, not stated as uncontrolled -CMP, A1c  7. Upper airway cough syndrome Follow up with Dr. Sherene Sires  8. Obesity, unspecified Losing weight  9. Tremor -possibly related to sertraline. Will stop. Substituted citalopram.  10. Constipation -add Senokot-S

## 2013-10-21 NOTE — Progress Notes (Signed)
Passed clock drawing 

## 2013-10-22 DIAGNOSIS — J441 Chronic obstructive pulmonary disease with (acute) exacerbation: Secondary | ICD-10-CM | POA: Diagnosis not present

## 2013-10-22 DIAGNOSIS — N183 Chronic kidney disease, stage 3 unspecified: Secondary | ICD-10-CM | POA: Diagnosis not present

## 2013-10-22 DIAGNOSIS — G609 Hereditary and idiopathic neuropathy, unspecified: Secondary | ICD-10-CM | POA: Diagnosis not present

## 2013-10-22 DIAGNOSIS — I129 Hypertensive chronic kidney disease with stage 1 through stage 4 chronic kidney disease, or unspecified chronic kidney disease: Secondary | ICD-10-CM | POA: Diagnosis not present

## 2013-10-22 DIAGNOSIS — M47817 Spondylosis without myelopathy or radiculopathy, lumbosacral region: Secondary | ICD-10-CM | POA: Diagnosis not present

## 2013-10-22 DIAGNOSIS — F411 Generalized anxiety disorder: Secondary | ICD-10-CM | POA: Diagnosis not present

## 2013-10-24 DIAGNOSIS — F411 Generalized anxiety disorder: Secondary | ICD-10-CM | POA: Diagnosis not present

## 2013-10-24 DIAGNOSIS — I129 Hypertensive chronic kidney disease with stage 1 through stage 4 chronic kidney disease, or unspecified chronic kidney disease: Secondary | ICD-10-CM | POA: Diagnosis not present

## 2013-10-24 DIAGNOSIS — N183 Chronic kidney disease, stage 3 unspecified: Secondary | ICD-10-CM | POA: Diagnosis not present

## 2013-10-24 DIAGNOSIS — J441 Chronic obstructive pulmonary disease with (acute) exacerbation: Secondary | ICD-10-CM | POA: Diagnosis not present

## 2013-10-24 DIAGNOSIS — M47817 Spondylosis without myelopathy or radiculopathy, lumbosacral region: Secondary | ICD-10-CM | POA: Diagnosis not present

## 2013-10-24 DIAGNOSIS — G609 Hereditary and idiopathic neuropathy, unspecified: Secondary | ICD-10-CM | POA: Diagnosis not present

## 2013-10-27 DIAGNOSIS — M47817 Spondylosis without myelopathy or radiculopathy, lumbosacral region: Secondary | ICD-10-CM | POA: Diagnosis not present

## 2013-10-27 DIAGNOSIS — F411 Generalized anxiety disorder: Secondary | ICD-10-CM | POA: Diagnosis not present

## 2013-10-27 DIAGNOSIS — N183 Chronic kidney disease, stage 3 unspecified: Secondary | ICD-10-CM | POA: Diagnosis not present

## 2013-10-27 DIAGNOSIS — I129 Hypertensive chronic kidney disease with stage 1 through stage 4 chronic kidney disease, or unspecified chronic kidney disease: Secondary | ICD-10-CM | POA: Diagnosis not present

## 2013-10-27 DIAGNOSIS — J441 Chronic obstructive pulmonary disease with (acute) exacerbation: Secondary | ICD-10-CM | POA: Diagnosis not present

## 2013-10-27 DIAGNOSIS — G609 Hereditary and idiopathic neuropathy, unspecified: Secondary | ICD-10-CM | POA: Diagnosis not present

## 2013-10-28 DIAGNOSIS — N183 Chronic kidney disease, stage 3 unspecified: Secondary | ICD-10-CM | POA: Diagnosis not present

## 2013-10-28 DIAGNOSIS — J441 Chronic obstructive pulmonary disease with (acute) exacerbation: Secondary | ICD-10-CM | POA: Diagnosis not present

## 2013-10-28 DIAGNOSIS — F411 Generalized anxiety disorder: Secondary | ICD-10-CM | POA: Diagnosis not present

## 2013-10-28 DIAGNOSIS — G609 Hereditary and idiopathic neuropathy, unspecified: Secondary | ICD-10-CM | POA: Diagnosis not present

## 2013-10-28 DIAGNOSIS — I129 Hypertensive chronic kidney disease with stage 1 through stage 4 chronic kidney disease, or unspecified chronic kidney disease: Secondary | ICD-10-CM | POA: Diagnosis not present

## 2013-10-28 DIAGNOSIS — M47817 Spondylosis without myelopathy or radiculopathy, lumbosacral region: Secondary | ICD-10-CM | POA: Diagnosis not present

## 2013-10-29 DIAGNOSIS — F411 Generalized anxiety disorder: Secondary | ICD-10-CM | POA: Diagnosis not present

## 2013-10-29 DIAGNOSIS — M47817 Spondylosis without myelopathy or radiculopathy, lumbosacral region: Secondary | ICD-10-CM | POA: Diagnosis not present

## 2013-10-29 DIAGNOSIS — N183 Chronic kidney disease, stage 3 unspecified: Secondary | ICD-10-CM | POA: Diagnosis not present

## 2013-10-29 DIAGNOSIS — J441 Chronic obstructive pulmonary disease with (acute) exacerbation: Secondary | ICD-10-CM | POA: Diagnosis not present

## 2013-10-29 DIAGNOSIS — G609 Hereditary and idiopathic neuropathy, unspecified: Secondary | ICD-10-CM | POA: Diagnosis not present

## 2013-10-29 DIAGNOSIS — I129 Hypertensive chronic kidney disease with stage 1 through stage 4 chronic kidney disease, or unspecified chronic kidney disease: Secondary | ICD-10-CM | POA: Diagnosis not present

## 2013-10-31 DIAGNOSIS — G609 Hereditary and idiopathic neuropathy, unspecified: Secondary | ICD-10-CM | POA: Diagnosis not present

## 2013-10-31 DIAGNOSIS — J441 Chronic obstructive pulmonary disease with (acute) exacerbation: Secondary | ICD-10-CM | POA: Diagnosis not present

## 2013-10-31 DIAGNOSIS — F411 Generalized anxiety disorder: Secondary | ICD-10-CM | POA: Diagnosis not present

## 2013-10-31 DIAGNOSIS — M47817 Spondylosis without myelopathy or radiculopathy, lumbosacral region: Secondary | ICD-10-CM | POA: Diagnosis not present

## 2013-10-31 DIAGNOSIS — I129 Hypertensive chronic kidney disease with stage 1 through stage 4 chronic kidney disease, or unspecified chronic kidney disease: Secondary | ICD-10-CM | POA: Diagnosis not present

## 2013-10-31 DIAGNOSIS — N183 Chronic kidney disease, stage 3 unspecified: Secondary | ICD-10-CM | POA: Diagnosis not present

## 2013-11-03 DIAGNOSIS — M47817 Spondylosis without myelopathy or radiculopathy, lumbosacral region: Secondary | ICD-10-CM | POA: Diagnosis not present

## 2013-11-03 DIAGNOSIS — I129 Hypertensive chronic kidney disease with stage 1 through stage 4 chronic kidney disease, or unspecified chronic kidney disease: Secondary | ICD-10-CM | POA: Diagnosis not present

## 2013-11-03 DIAGNOSIS — N183 Chronic kidney disease, stage 3 unspecified: Secondary | ICD-10-CM | POA: Diagnosis not present

## 2013-11-03 DIAGNOSIS — J441 Chronic obstructive pulmonary disease with (acute) exacerbation: Secondary | ICD-10-CM | POA: Diagnosis not present

## 2013-11-03 DIAGNOSIS — F411 Generalized anxiety disorder: Secondary | ICD-10-CM | POA: Diagnosis not present

## 2013-11-03 DIAGNOSIS — G609 Hereditary and idiopathic neuropathy, unspecified: Secondary | ICD-10-CM | POA: Diagnosis not present

## 2013-11-05 DIAGNOSIS — N183 Chronic kidney disease, stage 3 unspecified: Secondary | ICD-10-CM | POA: Diagnosis not present

## 2013-11-05 DIAGNOSIS — G609 Hereditary and idiopathic neuropathy, unspecified: Secondary | ICD-10-CM | POA: Diagnosis not present

## 2013-11-05 DIAGNOSIS — M47817 Spondylosis without myelopathy or radiculopathy, lumbosacral region: Secondary | ICD-10-CM | POA: Diagnosis not present

## 2013-11-05 DIAGNOSIS — I129 Hypertensive chronic kidney disease with stage 1 through stage 4 chronic kidney disease, or unspecified chronic kidney disease: Secondary | ICD-10-CM | POA: Diagnosis not present

## 2013-11-05 DIAGNOSIS — J441 Chronic obstructive pulmonary disease with (acute) exacerbation: Secondary | ICD-10-CM | POA: Diagnosis not present

## 2013-11-05 DIAGNOSIS — F411 Generalized anxiety disorder: Secondary | ICD-10-CM | POA: Diagnosis not present

## 2013-11-07 DIAGNOSIS — N183 Chronic kidney disease, stage 3 unspecified: Secondary | ICD-10-CM | POA: Diagnosis not present

## 2013-11-07 DIAGNOSIS — I129 Hypertensive chronic kidney disease with stage 1 through stage 4 chronic kidney disease, or unspecified chronic kidney disease: Secondary | ICD-10-CM | POA: Diagnosis not present

## 2013-11-07 DIAGNOSIS — G609 Hereditary and idiopathic neuropathy, unspecified: Secondary | ICD-10-CM | POA: Diagnosis not present

## 2013-11-07 DIAGNOSIS — M47817 Spondylosis without myelopathy or radiculopathy, lumbosacral region: Secondary | ICD-10-CM | POA: Diagnosis not present

## 2013-11-07 DIAGNOSIS — F411 Generalized anxiety disorder: Secondary | ICD-10-CM | POA: Diagnosis not present

## 2013-11-07 DIAGNOSIS — J441 Chronic obstructive pulmonary disease with (acute) exacerbation: Secondary | ICD-10-CM | POA: Diagnosis not present

## 2013-11-09 DIAGNOSIS — N183 Chronic kidney disease, stage 3 unspecified: Secondary | ICD-10-CM | POA: Diagnosis not present

## 2013-11-09 DIAGNOSIS — G609 Hereditary and idiopathic neuropathy, unspecified: Secondary | ICD-10-CM | POA: Diagnosis not present

## 2013-11-09 DIAGNOSIS — F411 Generalized anxiety disorder: Secondary | ICD-10-CM | POA: Diagnosis not present

## 2013-11-09 DIAGNOSIS — J441 Chronic obstructive pulmonary disease with (acute) exacerbation: Secondary | ICD-10-CM | POA: Diagnosis not present

## 2013-11-09 DIAGNOSIS — M47817 Spondylosis without myelopathy or radiculopathy, lumbosacral region: Secondary | ICD-10-CM | POA: Diagnosis not present

## 2013-11-09 DIAGNOSIS — I129 Hypertensive chronic kidney disease with stage 1 through stage 4 chronic kidney disease, or unspecified chronic kidney disease: Secondary | ICD-10-CM | POA: Diagnosis not present

## 2013-11-13 DIAGNOSIS — J441 Chronic obstructive pulmonary disease with (acute) exacerbation: Secondary | ICD-10-CM | POA: Diagnosis not present

## 2013-11-13 DIAGNOSIS — N183 Chronic kidney disease, stage 3 unspecified: Secondary | ICD-10-CM | POA: Diagnosis not present

## 2013-11-13 DIAGNOSIS — G609 Hereditary and idiopathic neuropathy, unspecified: Secondary | ICD-10-CM | POA: Diagnosis not present

## 2013-11-13 DIAGNOSIS — I129 Hypertensive chronic kidney disease with stage 1 through stage 4 chronic kidney disease, or unspecified chronic kidney disease: Secondary | ICD-10-CM | POA: Diagnosis not present

## 2013-11-13 DIAGNOSIS — M47817 Spondylosis without myelopathy or radiculopathy, lumbosacral region: Secondary | ICD-10-CM | POA: Diagnosis not present

## 2013-11-13 DIAGNOSIS — F411 Generalized anxiety disorder: Secondary | ICD-10-CM | POA: Diagnosis not present

## 2013-11-18 DIAGNOSIS — J441 Chronic obstructive pulmonary disease with (acute) exacerbation: Secondary | ICD-10-CM | POA: Diagnosis not present

## 2013-11-18 DIAGNOSIS — G609 Hereditary and idiopathic neuropathy, unspecified: Secondary | ICD-10-CM | POA: Diagnosis not present

## 2013-11-18 DIAGNOSIS — I129 Hypertensive chronic kidney disease with stage 1 through stage 4 chronic kidney disease, or unspecified chronic kidney disease: Secondary | ICD-10-CM | POA: Diagnosis not present

## 2013-11-18 DIAGNOSIS — N183 Chronic kidney disease, stage 3 unspecified: Secondary | ICD-10-CM | POA: Diagnosis not present

## 2013-11-18 DIAGNOSIS — M47817 Spondylosis without myelopathy or radiculopathy, lumbosacral region: Secondary | ICD-10-CM | POA: Diagnosis not present

## 2013-11-18 DIAGNOSIS — F411 Generalized anxiety disorder: Secondary | ICD-10-CM | POA: Diagnosis not present

## 2013-11-19 DIAGNOSIS — I129 Hypertensive chronic kidney disease with stage 1 through stage 4 chronic kidney disease, or unspecified chronic kidney disease: Secondary | ICD-10-CM | POA: Diagnosis not present

## 2013-11-19 DIAGNOSIS — G609 Hereditary and idiopathic neuropathy, unspecified: Secondary | ICD-10-CM | POA: Diagnosis not present

## 2013-11-19 DIAGNOSIS — J441 Chronic obstructive pulmonary disease with (acute) exacerbation: Secondary | ICD-10-CM | POA: Diagnosis not present

## 2013-11-19 DIAGNOSIS — M47817 Spondylosis without myelopathy or radiculopathy, lumbosacral region: Secondary | ICD-10-CM | POA: Diagnosis not present

## 2013-11-19 DIAGNOSIS — F411 Generalized anxiety disorder: Secondary | ICD-10-CM | POA: Diagnosis not present

## 2013-11-19 DIAGNOSIS — N183 Chronic kidney disease, stage 3 unspecified: Secondary | ICD-10-CM | POA: Diagnosis not present

## 2013-11-25 DIAGNOSIS — Z23 Encounter for immunization: Secondary | ICD-10-CM | POA: Diagnosis not present

## 2013-12-01 ENCOUNTER — Encounter: Payer: Self-pay | Admitting: Internal Medicine

## 2013-12-01 ENCOUNTER — Other Ambulatory Visit (INDEPENDENT_AMBULATORY_CARE_PROVIDER_SITE_OTHER): Payer: Medicare Other

## 2013-12-01 ENCOUNTER — Ambulatory Visit (INDEPENDENT_AMBULATORY_CARE_PROVIDER_SITE_OTHER): Payer: Medicare Other | Admitting: Internal Medicine

## 2013-12-01 ENCOUNTER — Encounter (INDEPENDENT_AMBULATORY_CARE_PROVIDER_SITE_OTHER): Payer: Medicare Other

## 2013-12-01 VITALS — BP 118/70 | HR 66 | Temp 98.4°F | Ht 64.0 in | Wt 170.0 lb

## 2013-12-01 DIAGNOSIS — R058 Other specified cough: Secondary | ICD-10-CM

## 2013-12-01 DIAGNOSIS — R0602 Shortness of breath: Secondary | ICD-10-CM

## 2013-12-01 DIAGNOSIS — R05 Cough: Secondary | ICD-10-CM

## 2013-12-01 DIAGNOSIS — M48061 Spinal stenosis, lumbar region without neurogenic claudication: Secondary | ICD-10-CM | POA: Diagnosis not present

## 2013-12-01 DIAGNOSIS — R259 Unspecified abnormal involuntary movements: Secondary | ICD-10-CM

## 2013-12-01 DIAGNOSIS — R059 Cough, unspecified: Secondary | ICD-10-CM

## 2013-12-01 DIAGNOSIS — M5137 Other intervertebral disc degeneration, lumbosacral region: Secondary | ICD-10-CM | POA: Diagnosis not present

## 2013-12-01 DIAGNOSIS — R251 Tremor, unspecified: Secondary | ICD-10-CM

## 2013-12-01 LAB — CBC WITH DIFFERENTIAL/PLATELET
Basophils Absolute: 0 10*3/uL (ref 0.0–0.1)
Basophils Relative: 0.6 % (ref 0.0–3.0)
EOS PCT: 3.9 % (ref 0.0–5.0)
Eosinophils Absolute: 0.3 10*3/uL (ref 0.0–0.7)
HCT: 35.7 % — ABNORMAL LOW (ref 36.0–46.0)
Hemoglobin: 12 g/dL (ref 12.0–15.0)
LYMPHS PCT: 26.5 % (ref 12.0–46.0)
Lymphs Abs: 1.7 10*3/uL (ref 0.7–4.0)
MCHC: 33.5 g/dL (ref 30.0–36.0)
MCV: 92.1 fl (ref 78.0–100.0)
MONO ABS: 0.7 10*3/uL (ref 0.1–1.0)
Monocytes Relative: 11 % (ref 3.0–12.0)
NEUTROS PCT: 58 % (ref 43.0–77.0)
Neutro Abs: 3.7 10*3/uL (ref 1.4–7.7)
PLATELETS: 231 10*3/uL (ref 150.0–400.0)
RBC: 3.88 Mil/uL (ref 3.87–5.11)
RDW: 14.7 % (ref 11.5–15.5)
WBC: 6.4 10*3/uL (ref 4.0–10.5)

## 2013-12-01 LAB — PULMONARY FUNCTION TEST
FEF 25-75 PRE: 0.61 L/s
FEF2575-%PRED-PRE: 65 %
FEV1-%Pred-Pre: 62 %
FEV1-Pre: 1.05 L
FEV1FVC-%PRED-PRE: 74 %
FEV6-%Pred-Pre: 91 %
FEV6-PRE: 1.94 L
FEV6FVC-%PRED-PRE: 105 %
FVC-%PRED-PRE: 86 %
FVC-Pre: 1.97 L
PRE FEV6/FVC RATIO: 98 %
Pre FEV1/FVC ratio: 53 %

## 2013-12-01 LAB — BASIC METABOLIC PANEL
BUN: 34 mg/dL — ABNORMAL HIGH (ref 6–23)
CALCIUM: 9.6 mg/dL (ref 8.4–10.5)
CO2: 25 meq/L (ref 19–32)
CREATININE: 1.4 mg/dL — AB (ref 0.4–1.2)
Chloride: 102 mEq/L (ref 96–112)
GFR: 36.64 mL/min — AB (ref >60.00)
Glucose, Bld: 100 mg/dL — ABNORMAL HIGH (ref 70–99)
Potassium: 4.4 mEq/L (ref 3.5–5.1)
SODIUM: 135 meq/L (ref 135–145)

## 2013-12-01 MED ORDER — DEXTROMETHORPHAN POLISTIREX 30 MG/5ML PO LQCR
ORAL | Status: DC
Start: 2013-12-01 — End: 2013-12-26

## 2013-12-01 NOTE — Progress Notes (Signed)
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 Pulmonary Consult    Referred by Dr. Murray Hodgkins for SOB with exertion   no sob sitting still  Sleeps ok on 3 pillows or has bad HB Cough more day than night more dry than wet Swallowing better since botox injection one week prior to OV   ? Some better with hfa saba/ worse with dpi spiriva/advair  >>rec Stop spiriva, advair, and losartan Depomedrol 120 mg today  micardis 80/12.5 one daily  prilosec 40 Take 30- 60 min before your first and last meals of the day  GERD diet  Only use your albuterol (proair) as a rescue medication   Work on inhaler technique  If can't learn  Technique may need to use albuterol as needed per neb.    GERD diet    10/16/13 Follow up ov/ NP  Pt was seen for pulmonary consult 1 month for for DOE and cough .  Has known swallow issues.  She is a never smoker . She had a dx of COPD, last ov she was taken off of Advair and spiriva .  Losartan was changed to micardis hct .  Since last ov she is feeling better.  Reports DOE and hoarseness  75% improved since last ov..  Rec Remain on current regimen  Follow up Dr. Sherene Sires  In 3-4 weeks with PFT    12/02/2013 f/u ov/[name] re: cough and sob / could not perform pfts Chief Complaint  Patient presents with  . Follow-up    Pt states that SOB and cough are unchanged. No new co's today.   300 hundred feet gives out, sob and fatigue about the same time, no worse off all inhalers  Cough now reported worse at hs since had botox for es stricture> GI f/u planned 12/02/13   No obvious day to day or  daytime variabilty or assoc chronic cough or cp or chest tightness, subjective wheeze overt sinus or hb symptoms. No unusual exp hx or h/o childhood pna/ asthma or knowledge of premature birth.  Sleeping ok without nocturnal  or early am exacerbation  of respiratory  c/o's or need for noct saba. Also denies any obvious fluctuation of symptoms with weather or environmental changes or other aggravating or alleviating factors except as outlined above   Current Medications, Allergies, Complete Past Medical History, Past Surgical History, Family History, and Social History were reviewed in [facility] record.  ROS  The following are not active complaints unless bolded sore throat, dysphagia, dental problems, itching, sneezing,  nasal congestion or excess/ purulent secretions, ear ache,   fever, chills, sweats, unintended wt loss, pleuritic or exertional cp, hemoptysis,  orthopnea pnd or leg swelling, presyncope, palpitations, heartburn, abdominal pain, anorexia, nausea, vomiting, diarrhea  or change in bowel or urinary habits, change in stools or urine, dysuria,hematuria,  rash, arthralgias, visual complaints, headache, numbness weakness or ataxia or problems with walking or coordination,  change in mood/affect or memory.                 Objective:  Physical Exam   Wt Readings from Last 3 Encounters:  12/01/13 170 lb (77.111 kg)  10/21/13 176 lb (79.833 kg)  10/16/13 177 lb 6.4 oz (80.468 kg)      Hoarse amb wf with classic voice fatigue  HEENT: nl dentition, turbinates, and orophanx. Nl external ear canals without cough reflex   NECK :  without JVD/Nodes/TM/ nl carotid upstrokes bilaterally   LUNGS: no acc muscle use, clear to A and P bilaterally without cough on insp or exp maneuvers   CV:  RRR  no s3 G II/ VI SEM - no increase in P2, no edema   ABD:  soft and nontender with nl excursion in the supine position. No bruits or organomegaly, bowel sounds  nl  MS:  warm without deformities, calf tenderness, cyanosis or clubbing  SKIN: warm and dry without lesions    NEURO:  alert, approp, no deficits   CXR  09/17/2013 : There is cardiomegaly without edema. The lungs are clear. No  pneumothorax or pleural effusion is seen. No focal bony abnormality  is identified.       Recent Labs Lab 12/01/13 1652  NA 135  K 4.4  CL 102  CO2 25  BUN 34*  CREATININE 1.4*  GLUCOSE 100*    Recent Labs Lab 12/01/13 1652  HGB 12.0  HCT 35.7*  WBC 6.4  PLT 231.0     Lab Results  Component Value Date   TSH 1.43 12/01/2013     Lab Results  Component Value Date   PROBNP 187.0* 12/01/2013             Assessment & Plan:

## 2013-12-01 NOTE — Patient Instructions (Addendum)
Delsym cough syrup 2 tsp every 12 hours as needed for cough instead of mucinex dm  Add pepcid ac 20 mg and chlortrimeton 4 mg at bedtime to see if helps the night time cough (both are over the counter)   GERD (REFLUX)  is an extremely common cause of respiratory symptoms, many times with no significant heartburn at all.    It can be treated with medication, but also with lifestyle changes including avoidance of late meals, excessive alcohol, smoking cessation, and avoid fatty foods, chocolate, peppermint, colas, red wine, and acidic juices such as orange juice.  NO MINT OR MENTHOL PRODUCTS SO NO COUGH DROPS  USE SUGARLESS CANDY INSTEAD (jolley ranchers or Stover's)  NO OIL BASED VITAMINS - use powdered substitutes.  Please remember to go to the lab  department downstairs for your tests - we will call you with the results when they are available.  Pulmonary follow up is as needed - ENT evaluation may need to be considered if hoarseness persists despite diet and GI input (Dr Dulce Sellar)   In the event that you have attacks of breathing difficulty rec albuterol neb 2.5 mg every 4 hours as need

## 2013-12-01 NOTE — Assessment & Plan Note (Signed)
.  12/01/2013  Walked RA @ slow pace  x 3 laps @ 185 ft each stopped due to  Fatigue = sob, hoarseness worse with activity, sats fine     Does not appear to have a significant pulmonary problem/ limit at this point on modified regimen than does not include inhalers > not further pulmonary f/u needed

## 2013-12-01 NOTE — Assessment & Plan Note (Signed)
-   hfa 0% 09/17/2013 so try off all inhalers and change losartan to alternative arb > no worse off all inhalers 12/01/2013  No evidence at all this is asthma - try empiric h1 and h2 at hs since cough is worse then and consider GI w/u next for assoc hoarseness but not further pulmonary f/u needed

## 2013-12-02 ENCOUNTER — Ambulatory Visit: Payer: Medicare Other | Admitting: Internal Medicine

## 2013-12-02 DIAGNOSIS — R1310 Dysphagia, unspecified: Secondary | ICD-10-CM | POA: Diagnosis not present

## 2013-12-02 DIAGNOSIS — R059 Cough, unspecified: Secondary | ICD-10-CM | POA: Diagnosis not present

## 2013-12-02 DIAGNOSIS — R05 Cough: Secondary | ICD-10-CM | POA: Diagnosis not present

## 2013-12-02 LAB — BRAIN NATRIURETIC PEPTIDE: Pro B Natriuretic peptide (BNP): 187 pg/mL — ABNORMAL HIGH (ref 0.0–100.0)

## 2013-12-02 LAB — TSH: TSH: 1.43 u[IU]/mL (ref 0.35–4.50)

## 2013-12-02 NOTE — Assessment & Plan Note (Signed)
TSH nl, no need for albuterol x as rescue

## 2013-12-02 NOTE — Progress Notes (Signed)
Quick Note:  LMTCB ______ 

## 2013-12-09 DIAGNOSIS — L600 Ingrowing nail: Secondary | ICD-10-CM | POA: Diagnosis not present

## 2013-12-09 DIAGNOSIS — B351 Tinea unguium: Secondary | ICD-10-CM | POA: Diagnosis not present

## 2013-12-09 DIAGNOSIS — M2010 Hallux valgus (acquired), unspecified foot: Secondary | ICD-10-CM | POA: Diagnosis not present

## 2013-12-09 DIAGNOSIS — M79673 Pain in unspecified foot: Secondary | ICD-10-CM | POA: Diagnosis not present

## 2013-12-17 ENCOUNTER — Ambulatory Visit (INDEPENDENT_AMBULATORY_CARE_PROVIDER_SITE_OTHER): Payer: Medicare Other | Admitting: Internal Medicine

## 2013-12-17 ENCOUNTER — Encounter: Payer: Self-pay | Admitting: Internal Medicine

## 2013-12-17 VITALS — BP 130/82 | HR 61 | Temp 97.6°F | Resp 10 | Ht 64.0 in | Wt 171.0 lb

## 2013-12-17 DIAGNOSIS — R5383 Other fatigue: Secondary | ICD-10-CM | POA: Diagnosis not present

## 2013-12-17 DIAGNOSIS — E1121 Type 2 diabetes mellitus with diabetic nephropathy: Secondary | ICD-10-CM | POA: Diagnosis not present

## 2013-12-17 DIAGNOSIS — R05 Cough: Secondary | ICD-10-CM | POA: Diagnosis not present

## 2013-12-17 DIAGNOSIS — E1129 Type 2 diabetes mellitus with other diabetic kidney complication: Secondary | ICD-10-CM

## 2013-12-17 DIAGNOSIS — N182 Chronic kidney disease, stage 2 (mild): Secondary | ICD-10-CM | POA: Diagnosis not present

## 2013-12-17 DIAGNOSIS — R413 Other amnesia: Secondary | ICD-10-CM | POA: Diagnosis not present

## 2013-12-17 DIAGNOSIS — R058 Other specified cough: Secondary | ICD-10-CM

## 2013-12-17 DIAGNOSIS — M25552 Pain in left hip: Secondary | ICD-10-CM

## 2013-12-17 DIAGNOSIS — R059 Cough, unspecified: Secondary | ICD-10-CM | POA: Insufficient documentation

## 2013-12-17 HISTORY — DX: Type 2 diabetes mellitus with other diabetic kidney complication: E11.29

## 2013-12-17 NOTE — Progress Notes (Signed)
Patient ID: Christine Pierce, female   DOB: 1921/10/28, 80 y.o.   MRN: [id number]    Facility  [facility]    Place of Service:   OFFICE   Allergies  Allergen Reactions  . Macrodantin [Nitrofurantoin Macrocrystal]     Affects kidneys    Chief Complaint  Patient presents with  . Acute Visit    Memory concerns, last MMSE 04/30/13 (27/30), patient would like referral to Neurologist. Pain in back and buttocks- ongoing pain (off/on) - not taking current pain medciaitons   . FYI    ALL medications have to be printed and given to patient    HPI:  Memory deficits: Caregiver reports worsening short-term memory loss, declined to retake MMSE today as she feels she "has the test memorized," requesting referral to Neurology,   Pain in low back: C/o intermittent low back pain, that radiates down right lower extremity  Pain in hip, left: C/o intermittent hip pain at rest, exacerbated by walking. Has increased daily exercise since moving to [place], often walks laps multiple times per day. Was not exercising at all prior to moving to [facility] living. Ortho has recommended treating with Tylenol. Has taken Tylenol with some relief but often does not request it from staff but tells family is taking it daily.   Upper airway cough syndrome: Improved. POA reports has cough about once a week since medication adjustment in July of 2015. Pt reports when does cough is very painful. Nonproductive, no fever chills, sore throat.  Fatigue: Continues with fatigue. Is not sleeping well d/t urgency and frequency, up several times per night to go to urinate. Often stays in bed and skips breakfast. Has ensures and granola bars available to her but "doesn't feel like eating breakfast." POA requesting a Rx for ensures in the morning so staff will ensure that she drinks them daily.      Medications: Patient's Medications  New Prescriptions   No medications on file  Previous Medications   ACETAMINOPHEN (TYLENOL)  325 MG TABLET    Take 650 mg by mouth every 4 (four) hours as needed for pain.   ALBUTEROL (PROVENTIL HFA;VENTOLIN HFA) 108 (90 BASE) MCG/ACT INHALER    Inhale 2 puffs into the lungs every 6 (six) hours as needed for wheezing.   ALPRAZOLAM (XANAX) 0.5 MG TABLET    Take 1 tablet (0.5 mg total) by mouth 2 (two) times daily as needed for anxiety.   ASPIRIN 81 MG CHEWABLE TABLET    Chew 81 mg by mouth daily.   CITALOPRAM (CELEXA) 20 MG TABLET    Take 1 tablet (20 mg total) by mouth daily.   DEXTROMETHORPHAN (DELSYM) 30 MG/5ML LIQUID    2 tsp every 12 hours as needed for cough   HYDROCORTISONE CREAM 1 %    Apply to pruritic or irritated areas of legs twice daily as needed   LOVASTATIN (MEVACOR) 40 MG TABLET    Take 40 mg by mouth every morning.   MELATONIN 3 MG CAPS    Take 3 mg by mouth at bedtime as needed (Sleep).   MISC. DEVICES (ROLLER WALKER) MISC    Rolling Walker with seat and brakes.   MULTIPLE VITAMIN (MULTIVITAMIN WITH MINERALS) TABS TABLET    Take 1 tablet by mouth daily.   NITROGLYCERIN (NITROSTAT) 0.4 MG SL TABLET    Place 0.4 mg under the tongue every 5 (five) minutes as needed for chest pain.   OMEPRAZOLE (PRILOSEC) 20 MG CAPSULE    Take 20  mg by mouth 2 (two) times daily before a meal.    OXYBUTYNIN (DITROPAN-XL) 10 MG 24 HR TABLET    Take 10 mg by mouth at bedtime.   SENNA-DOCUSATE (SENOKOT-S) 8.6-50 MG PER TABLET    One nightly to prevent constipation   TELMISARTAN-HYDROCHLOROTHIAZIDE (MICARDIS HCT) 80-12.5 MG PER TABLET    Take 1 tablet by mouth daily.  Modified Medications   No medications on file  Discontinued Medications   No medications on file     Review of Systems  Constitutional: Positive for fatigue. Negative for fever, chills, diaphoresis, activity change, appetite change and unexpected weight change.  HENT: Positive for hearing loss.   Eyes: Negative.   Respiratory: Positive for cough and shortness of breath.        Complains of a hoarse voice for the last few  months. There is no pain.  Cardiovascular: Positive for leg swelling. Negative for palpitations.  Gastrointestinal: Positive for constipation and rectal pain.       Occasional fecal incontinence. Sometimes has difficulty swallowing. Solids and liquids seem to be involved. There is no odynophagia.  Endocrine:       Elevated blood sugars. Currently on "dietary control". She is not following her diet closely.  Genitourinary: Positive for urgency and frequency. Negative for dysuria, decreased urine volume, vaginal bleeding, vaginal discharge, enuresis, genital sores, vaginal pain and pelvic pain.  Musculoskeletal:       Generalized weakness: Pain in the right hip area. Requires assistance in getting up and down. At risk for falls.  Skin: Negative.   Allergic/Immunologic: Negative.   Neurological: Positive for weakness.       Memory loss.  Hematological: Negative.   Psychiatric/Behavioral: Positive for confusion. The patient is nervous/anxious.        History of hoarding behavior. Impulsive buying of products on television.    Filed Vitals:   12/17/13 1203  BP: 130/82  Pulse: 61  Temp: 97.6 F (36.4 C)  TempSrc: Oral  Resp: 10  Height: 5\' 4"  (1.626 m)  Weight: 171 lb (77.565 kg)  SpO2: 90%   Body mass index is 29.34 kg/(m^2).  Physical Exam  Constitutional: She is oriented to person, place, and time. No distress.  Overweight  HENT:  Head: Atraumatic.  Partial deafness. Uses hearing aids.  Eyes:  Wears prescription lenses.  Neck: No JVD present. No tracheal deviation present. No thyromegaly present.  Some tenderness with movement laterally to either side.  Cardiovascular: Normal rate and regular rhythm.  Exam reveals no friction rub.   Murmur (3/6 SEM at LSB and aortic ejection) heard. Bilateral varicose veins  Pulmonary/Chest: No respiratory distress. She has no wheezes. She has no rales. She exhibits no tenderness.  Tender left flank area lower rib margins laterally and  slightly anteriorly. Voice is slightly hoarse.  Abdominal: Soft. Bowel sounds are normal. She exhibits no distension and no mass. There is no tenderness.  Genitourinary: Guaiac negative stool.  Anal fissure at 6 o'clock  Musculoskeletal: Normal range of motion. She exhibits no edema and no tenderness.  Tender the greater trochanter and right groin. She is hobbled by this decrease in her stride length.  Lymphadenopathy:    She has no cervical adenopathy.  Neurological: She is alert and oriented to person, place, and time. No cranial nerve deficit. She exhibits normal muscle tone. Coordination normal.  04/30/12 MMSE 27/30. Passed clock drawing. Having some trouble in sequencing events and in telling a coherent story.  Insensitive to vibration in the feet.  Skin: No rash noted. She is not diaphoretic. No erythema. No pallor.  Psychiatric: She has a normal mood and affect. Her behavior is normal. Judgment and thought content normal.     Labs reviewed: Appointment on 12/01/2013  Component Date Value Ref Range Status  . Sodium 12/01/2013 135  135 - 145 mEq/L Final  . Potassium 12/01/2013 4.4  3.5 - 5.1 mEq/L Final  . Chloride 12/01/2013 102  96 - 112 mEq/L Final  . CO2 12/01/2013 25  19 - 32 mEq/L Final  . Glucose, Bld 12/01/2013 100* 70 - 99 mg/dL Final  . BUN 69/62/952809/28/2015 34* 6 - 23 mg/dL Final  . Creatinine, Ser 12/01/2013 1.4* 0.4 - 1.2 mg/dL Final  . Calcium 41/32/440109/28/2015 9.6  8.4 - 10.5 mg/dL Final  . GFR 02/72/536609/28/2015 36.64* >60.00 mL/min Final  . Pro B Natriuretic peptide (BNP) 12/01/2013 187.0* 0.0 - 100.0 pg/mL Final  . WBC 12/01/2013 6.4  4.0 - 10.5 K/uL Final  . RBC 12/01/2013 3.88  3.87 - 5.11 Mil/uL Final  . Hemoglobin 12/01/2013 12.0  12.0 - 15.0 g/dL Final  . HCT 44/03/474209/28/2015 35.7* 36.0 - 46.0 % Final  . MCV 12/01/2013 92.1  78.0 - 100.0 fl Final  . MCHC 12/01/2013 33.5  30.0 - 36.0 g/dL Final  . RDW 59/56/387509/28/2015 14.7  11.5 - 15.5 % Final  . Platelets 12/01/2013 231.0  150.0 - 400.0  K/uL Final  . Neutrophils Relative % 12/01/2013 58.0  43.0 - 77.0 % Final  . Lymphocytes Relative 12/01/2013 26.5  12.0 - 46.0 % Final  . Monocytes Relative 12/01/2013 11.0  3.0 - 12.0 % Final  . Eosinophils Relative 12/01/2013 3.9  0.0 - 5.0 % Final  . Basophils Relative 12/01/2013 0.6  0.0 - 3.0 % Final  . Neutro Abs 12/01/2013 3.7  1.4 - 7.7 K/uL Final  . Lymphs Abs 12/01/2013 1.7  0.7 - 4.0 K/uL Final  . Monocytes Absolute 12/01/2013 0.7  0.1 - 1.0 K/uL Final  . Eosinophils Absolute 12/01/2013 0.3  0.0 - 0.7 K/uL Final  . Basophils Absolute 12/01/2013 0.0  0.0 - 0.1 K/uL Final  . TSH 12/01/2013 1.43  0.35 - 4.50 uIU/mL Final  Appointment on 12/01/2013  Component Date Value Ref Range Status  . FVC-Pre 12/01/2013 1.97   Final  . FVC-%Pred-Pre 12/01/2013 86   Final  . FEV1-Pre 12/01/2013 1.05   Final  . FEV1-%Pred-Pre 12/01/2013 62   Final  . FEV6-Pre 12/01/2013 1.94   Final  . FEV6-%Pred-Pre 12/01/2013 91   Final  . Pre FEV1/FVC ratio 12/01/2013 53   Final  . FEV1FVC-%Pred-Pre 12/01/2013 74   Final  . Pre FEV6/FVC Ratio 12/01/2013 98   Final  . FEV6FVC-%Pred-Pre 12/01/2013 105   Final  . FEF 25-75 Pre 12/01/2013 0.61   Final  . FEF2575-%Pred-Pre 12/01/2013 65   Final     Assessment/Plan  1. Memory deficits - last MMSE 27/30 on 04/30/13 - refer to Neurology  2. Pain in hip, left Unchanged -Aspercreme to painful areas 4 times daily.  3. Upper airway cough syndrome Offer Mucinex q12h  4. Fatigue -chronic

## 2013-12-23 DIAGNOSIS — N390 Urinary tract infection, site not specified: Secondary | ICD-10-CM | POA: Diagnosis not present

## 2013-12-23 DIAGNOSIS — N189 Chronic kidney disease, unspecified: Secondary | ICD-10-CM | POA: Diagnosis not present

## 2013-12-23 DIAGNOSIS — I129 Hypertensive chronic kidney disease with stage 1 through stage 4 chronic kidney disease, or unspecified chronic kidney disease: Secondary | ICD-10-CM | POA: Diagnosis not present

## 2013-12-23 DIAGNOSIS — N183 Chronic kidney disease, stage 3 (moderate): Secondary | ICD-10-CM | POA: Diagnosis not present

## 2013-12-23 DIAGNOSIS — N2581 Secondary hyperparathyroidism of renal origin: Secondary | ICD-10-CM | POA: Diagnosis not present

## 2013-12-23 DIAGNOSIS — D631 Anemia in chronic kidney disease: Secondary | ICD-10-CM | POA: Diagnosis not present

## 2013-12-26 ENCOUNTER — Encounter: Payer: Self-pay | Admitting: Neurology

## 2013-12-26 ENCOUNTER — Ambulatory Visit (INDEPENDENT_AMBULATORY_CARE_PROVIDER_SITE_OTHER): Payer: Medicare Other | Admitting: Neurology

## 2013-12-26 VITALS — BP 141/95 | HR 84 | Ht 65.0 in | Wt 171.0 lb

## 2013-12-26 DIAGNOSIS — R4587 Impulsiveness: Secondary | ICD-10-CM

## 2013-12-26 DIAGNOSIS — G3184 Mild cognitive impairment, so stated: Secondary | ICD-10-CM

## 2013-12-30 NOTE — Progress Notes (Signed)
NEUROLOGY CLINIC NEW PATIENT NOTE  NAME: Christine GuilesMary J Martelle DOB: 04-Jun-1921 REFERRING PHYSICIAN: Kimber RelicGreen, Arthur G, MD  I saw Christine Pierce as a new consult in the neurovascular clinic today regarding  Chief Complaint  Patient presents with  . New Evaluation    RM 4  . MEMORY DEFICITS  .  HPI: Christine GuilesMary J Dancel is a 80 y.o. female with PMH of HTN, HLD, COPD, GERD, CKD, and anxiety who presents as a new patient for cognitive concerns.   Pt was accompanied by her medical POA and also her friend who provided some subjective history. Pt also provided significant part of the history.   Pt was doing well and in her normal health until around 2000 when she lost her mom and son within 2-year period. Pt was depressed at that time and became social withdraw. She was not going out and stayed in her house, lived alone, not eating well, not dressing well, not speak well, very disorganized in house, rodents everywhere, uncontrolled buying through telephone, accumulated a lot of stuff in house such that no one could get into her house. She spent all her money and also borrowed significant amount of debt. Eventually, she had to sale her house to pay debt and she was moved to [facility]F about 1.5 years ago. Since then, she was eating better, more cooperative, had financial help from her friend, although still a lot of debt left but she is going to the right direction.   While she lives in ALF, friends found her having trouble to learn new things, such as how to use inhalers, remote control, and new hearing aids, how to dial numbers from her new phones, etc. She was taught many times but just not able to do it herself. However, she was able to do all her ADLs, writing checks, and still very talkative. She denies any weakness, numbness, LOC, vision changes or speech changes. She denies she has any memory problem. She follows with her PCP every 2-3 months and she denies any smoking, alcohol or illicit drugs. She was referred  by Dr. Chilton SiGreen for cognitive evaluation.   Had conversation with her during visit, she behaved very impulsive, pressured speech, super talkative and with minimal cognitive impairment. Had MOCA done which was 27/30.   She has home meds xanax which she use PRN for her anxiety. For depression, she is on celexa. She is also on ASA and lovastatin for stroke prevention.   Past Medical History  Diagnosis Date  . Hypertension 10/23/2011  . Shortness of breath 04/30/2012  . Other nonspecific abnormal finding of lung field   . Contact with or exposure to unspecified communicable disease   . Cystocele, midline 04/10/2012  . Cough 04/10/2012  . Full incontinence of feces 04/10/2012  . Urgency of urination 04/10/2012  . Abdominal pain, other specified site   . Orthopnea 12/01/2011  . Type II or unspecified type diabetes mellitus without mention of complication, not stated as uncontrolled 11/16/2009  . Other and unspecified hyperlipidemia 10/20/2011  . Urinary tract infection, site not specified 09/11/2011  . Muscle weakness (generalized) 09/11/2011  . Syncope and collapse 03/29/2011  . Pain in joint, ankle and foot 12/27/2010  . Sprain of ribs 08/15/2010  . Unspecified tinnitus 07/04/2010  . Pneumonia, organism unspecified 05/04/2010  . Diverticulosis of colon (without mention of hemorrhage)   . Other and unspecified ovarian cyst 05/04/2010  . Abdominal pain, right lower quadrant 05/02/2010  . Anxiety state, unspecified 02/14/2010  .  Shortness of breath 01/05/2010  . Nausea alone 12/13/2009  . Dehydration   . Pain in joint, shoulder region 11/15/2009  . Other abnormal blood chemistry   . Pain in joint, pelvic region and thigh 07/29/2009  . Personal history of fall 07/29/2009  . Other malaise and fatigue 02/15/2009  . Obesity, unspecified 07/16/2008  . Unspecified hereditary and idiopathic peripheral neuropathy   . Reflux esophagitis 10/18/2007  . Unspecified vitamin D deficiency  07/19/2007  . Chest pain, unspecified 07/19/2007  . Varicose veins of lower extremities with inflammation 04/25/2006  . Restless legs syndrome (RLS) 01/16/2006  . Nocturia 07/07/2005  . Encounter for long-term (current) use of other medications   . Cervicalgia 01/20/2004  . Osteoporosis, unspecified 01/23/2003  . Sciatica 04/03/2001  . Routine general medical examination at a health care facility 11/15/2009  . Pain in joint, lower leg 12/11/2001  . Lumbago 02/20/2001  . Hard of hearing     both ears  . Renal disorder     last saw dr deterding 2 months ago, all new meds must be oked thru dr deterding  . Chronic kidney disease, stage III (moderate) 12/15/09  . Acute kidney failure, unspecified 12/10/2009  . Hydronephrosis 07/16/2008  . Cold   . Hypertension   . Diabetes     MANAGED WITH DIET AND EXERCISE  . Anxiety   . High cholesterol   . Depression    Past Surgical History  Procedure Laterality Date  . Appendectomy    . Stapedectomy Bilateral [phone]    middle ear surgery  . Eye surgery Left 1988    cataract w IOL implant, Dr. Vic Ripper  . Fracture surgery Right 09/2004    distal radius/Dr. Teressa Senter  . Tonsillectomy and adenoidectomy  age 25  . Esophagogastroduodenoscopy (egd) with propofol N/A 09/03/2013    Procedure: ESOPHAGOGASTRODUODENOSCOPY (EGD) WITH PROPOFOL;  Surgeon: Willis Modena, MD;  Location: [facility];  Service: Endoscopy;  Laterality: N/A;  . Botox injection N/A 09/03/2013    Procedure: BOTOX INJECTION;  Surgeon: Willis Modena, MD;  Location: [facility];  Service: Endoscopy;  Laterality: N/A;   Family History  Problem Relation Age of Onset  . Cancer Mother     abdomen  . Cancer Father     brain, lung  . Heart disease Son    Current Outpatient Prescriptions  Medication Sig Dispense Refill  . acetaminophen (TYLENOL) 325 MG tablet Take 650 mg by mouth every 4 (four) hours as needed for pain.      Marland Kitchen albuterol (PROVENTIL HFA;VENTOLIN HFA) 108 (90 BASE)  MCG/ACT inhaler Inhale 2 puffs into the lungs every 6 (six) hours as needed for wheezing.  1 Inhaler  0  . ALPRAZolam (XANAX) 0.5 MG tablet Take 1 tablet (0.5 mg total) by mouth 2 (two) times daily as needed for anxiety.  60 tablet  0  . aspirin 81 MG chewable tablet Chew 81 mg by mouth daily.      . citalopram (CELEXA) 20 MG tablet Take 1 tablet (20 mg total) by mouth daily.  30 tablet  3  . dextromethorphan-guaiFENesin (MUCINEX DM) 30-600 MG per 12 hr tablet Take 1 tablet by mouth 2 (two) times daily as needed for cough.      . hydrocortisone cream 1 % Apply to pruritic or irritated areas of legs twice daily as needed  30 g  0  . lovastatin (MEVACOR) 40 MG tablet Take 40 mg by mouth every morning.      . Melatonin 3 MG  CAPS Take 3 mg by mouth at bedtime as needed (Sleep).      . Misc. Devices (ROLLER WALKER) MISC Rolling Walker with seat and brakes.  1 each  0  . Multiple Vitamin (MULTIVITAMIN WITH MINERALS) TABS tablet Take 1 tablet by mouth daily.      . nitroGLYCERIN (NITROSTAT) 0.4 MG SL tablet Place 0.4 mg under the tongue every 5 (five) minutes as needed for chest pain.      Marland Kitchen. omeprazole (PRILOSEC) 40 MG capsule Take 40 mg by mouth 2 (two) times daily.      Marland Kitchen. oxybutynin (DITROPAN-XL) 10 MG 24 hr tablet Take 10 mg by mouth at bedtime.      . senna-docusate (SENOKOT-S) 8.6-50 MG per tablet One nightly to prevent constipation  30 tablet  5  . telmisartan-hydrochlorothiazide (MICARDIS HCT) 80-12.5 MG per tablet Take 1 tablet by mouth daily.  30 tablet  11   No current facility-administered medications for this visit.   Allergies  Allergen Reactions  . Macrodantin [Nitrofurantoin Macrocrystal]     Affects kidneys   History   Social History  . Marital Status: Widowed    Spouse Name: N/A    Number of Children: N/A  . Years of Education: N/A   Occupational History  . Not on file.   Social History Main Topics  . Smoking status: Never Smoker   . Smokeless tobacco: Never Used  .  Alcohol Use: No  . Drug Use: No  . Sexual Activity: Not on file   Other Topics Concern  . Not on file   Social History Narrative  . No narrative on file    Review of Systems Full 14 system review of systems performed and notable only for those listed, all others are neg:  Constitutional: fatigue  Cardiovascular: N/A  Ear/Nose/Throat: hearing loss  Skin: N/A  Eyes: N/A  Respiratory: cough  Gastroitestinal: N/A  Hematology/Lymphatic: N/A  Endocrine: N/A  Musculoskeletal: joint pain  Allergy/Immunology: N/A  Neurological: memory loss, confusion, insomnia  Psychiatric: depression, anxiety, not enough sleep, change in appetite   Physical Exam  Filed Vitals:   12/26/13 1142  BP: 141/95  Pulse: 84    General - Well nourished, well developed, in no apparent distress.  Ophthalmologic - Sharp disc margins OU.  Cardiovascular - Regular rate and rhythm with no murmur. Carotid pulses were 2+ without bruits .   Neck - supple, no nuchal rigidity .  Mental Status -  Level of arousal and orientation to time, place, and person were intact. Language including expression, naming, repetition, comprehension, reading, and writing was assessed and found intact. Attention span and concentration were normal. Recent and remote memory were intact. Fund of Knowledge was assessed and was intact.  MOCA  visuospatial - executive 5/5 Naming - 3/3 Memory -  Attention - 2/2, 1/1, 3/3 Language - 2/2, 0/1 Abstraction - 2/2 Delayed recall - 3/5 Orientation - 6/6  Total - 27/30   Cranial Nerves II - XII - II - Visual field intact OU. III, IV, VI - Extraocular movements intact. V - Facial sensation intact bilaterally. VII - Facial movement intact bilaterally. VIII - Hearing & vestibular intact bilaterally. X - Palate elevates symmetrically. XI - Chin turning & shoulder shrug intact bilaterally. XII - Tongue protrusion intact.  Motor Strength - The patient's strength was normal in  all extremities and pronator drift was absent.  Bulk was normal and fasciculations were absent.   Motor Tone - Muscle tone was assessed at  the neck and appendages and was normal.  Reflexes - The patient's reflexes were normal in all extremities and she had no pathological reflexes.  Sensory - Light touch, temperature/pinprick were assessed and were normal.    Coordination - The patient had normal movements in the hands and feet with no ataxia or dysmetria.  Tremor was absent.  Gait and Station - walk with cane, slow, small stride.   Imaging none  Lab Review Component     Latest Ref Rng 03/26/2013 07/18/2013 12/01/2013  Cholesterol, Total     100 - 199 mg/dL  161   Triglycerides     0 - 149 mg/dL  64   HDL     >09 mg/dL  58   VLDL Cholesterol Cal     5 - 40 mg/dL  13   LDL (calc)     0 - 99 mg/dL  77   Total CHOL/HDL Ratio     0.0 - 4.4 ratio units  2.6   Hemoglobin A1C     4.8 - 5.6 % 6.0 (H) 6.1 (H)   Estimated average glucose      126 128   TSH     0.35 - 4.50 uIU/mL 1.150  1.43     Assessment and Plan:   In summary, DORIANN ZUCH is a 80 y.o. female with PMH of HTN, HLD, COPD, GERD, CKD, and anxiety presents as a new consult for cognitive decline. However, on neuro exam and interaction with pt, she does not have dementia, instead her mind is sharp. Her MOCA 27/30, only has 2/5 objects not able to remember and 9/11 words starting with "F". she even do not have MCI. However, she does present with impulsive, restless, pressured speech and talkative, resembling ADHD type of picture. She has requested to be seen by psychiatrist and further evaluation.   - recommend psychiatry or psychology input about impulsivity, ADHD like behavior - recommend next blood draw with PCP to draw TSH, free T4, A1V, B12 for dementia work up - follow up with PCP regularly.  - RTC in 3 months and repeat MOCA.   Thank you very much for the opportunity to participate in the care of this patient.   Please do not hesitate to call if any questions or concerns arise.  No orders of the defined types were placed in this encounter.    Meds ordered this encounter  Medications  . omeprazole (PRILOSEC) 40 MG capsule    Sig: Take 40 mg by mouth 2 (two) times daily.  Marland Kitchen dextromethorphan-guaiFENesin (MUCINEX DM) 30-600 MG per 12 hr tablet    Sig: Take 1 tablet by mouth 2 (two) times daily as needed for cough.    There are no Patient Instructions on file for this visit.  Marvel Plan, MD PhD [facility] [address] [place], [place] [address] [phone]

## 2014-01-01 DIAGNOSIS — Z961 Presence of intraocular lens: Secondary | ICD-10-CM | POA: Diagnosis not present

## 2014-01-01 DIAGNOSIS — H3531 Nonexudative age-related macular degeneration: Secondary | ICD-10-CM | POA: Diagnosis not present

## 2014-01-01 DIAGNOSIS — H52203 Unspecified astigmatism, bilateral: Secondary | ICD-10-CM | POA: Diagnosis not present

## 2014-01-01 DIAGNOSIS — H43813 Vitreous degeneration, bilateral: Secondary | ICD-10-CM | POA: Diagnosis not present

## 2014-01-19 ENCOUNTER — Other Ambulatory Visit: Payer: Medicare Other

## 2014-01-19 DIAGNOSIS — E1129 Type 2 diabetes mellitus with other diabetic kidney complication: Secondary | ICD-10-CM

## 2014-01-19 DIAGNOSIS — Z87440 Personal history of urinary (tract) infections: Secondary | ICD-10-CM

## 2014-01-19 DIAGNOSIS — N182 Chronic kidney disease, stage 2 (mild): Secondary | ICD-10-CM

## 2014-01-19 DIAGNOSIS — E1121 Type 2 diabetes mellitus with diabetic nephropathy: Secondary | ICD-10-CM | POA: Diagnosis not present

## 2014-01-20 LAB — COMPREHENSIVE METABOLIC PANEL
ALT: 9 IU/L (ref 0–32)
AST: 19 IU/L (ref 0–40)
Albumin/Globulin Ratio: 1.7 (ref 1.1–2.5)
Albumin: 4 g/dL (ref 3.2–4.6)
Alkaline Phosphatase: 60 IU/L (ref 39–117)
BUN/Creatinine Ratio: 21 (ref 11–26)
BUN: 28 mg/dL (ref 10–36)
CO2: 22 mmol/L (ref 18–29)
Calcium: 9.8 mg/dL (ref 8.7–10.3)
Chloride: 102 mmol/L (ref 97–108)
Creatinine, Ser: 1.32 mg/dL — ABNORMAL HIGH (ref 0.57–1.00)
GFR calc Af Amer: 41 mL/min/{1.73_m2} — ABNORMAL LOW (ref >59)
GFR, EST NON AFRICAN AMERICAN: 36 mL/min/{1.73_m2} — AB (ref >59)
Globulin, Total: 2.3 g/dL (ref 1.5–4.5)
Glucose: 97 mg/dL (ref 65–99)
POTASSIUM: 5.2 mmol/L (ref 3.5–5.2)
Sodium: 139 mmol/L (ref 134–144)
Total Bilirubin: 0.3 mg/dL (ref 0.0–1.2)
Total Protein: 6.3 g/dL (ref 6.0–8.5)

## 2014-01-20 LAB — HEMOGLOBIN A1C
Est. average glucose Bld gHb Est-mCnc: 131 mg/dL
HEMOGLOBIN A1C: 6.2 % — AB (ref 4.8–5.6)

## 2014-01-21 ENCOUNTER — Ambulatory Visit: Payer: Medicare Other | Admitting: Internal Medicine

## 2014-01-27 ENCOUNTER — Encounter: Payer: Self-pay | Admitting: Internal Medicine

## 2014-01-27 ENCOUNTER — Ambulatory Visit (INDEPENDENT_AMBULATORY_CARE_PROVIDER_SITE_OTHER): Payer: Medicare Other | Admitting: Internal Medicine

## 2014-01-27 VITALS — BP 132/86 | HR 66 | Temp 97.5°F | Resp 10 | Ht 65.0 in | Wt 170.0 lb

## 2014-01-27 DIAGNOSIS — R251 Tremor, unspecified: Secondary | ICD-10-CM

## 2014-01-27 DIAGNOSIS — R1314 Dysphagia, pharyngoesophageal phase: Secondary | ICD-10-CM

## 2014-01-27 DIAGNOSIS — N3000 Acute cystitis without hematuria: Secondary | ICD-10-CM | POA: Diagnosis not present

## 2014-01-27 DIAGNOSIS — R05 Cough: Secondary | ICD-10-CM | POA: Diagnosis not present

## 2014-01-27 DIAGNOSIS — E1121 Type 2 diabetes mellitus with diabetic nephropathy: Secondary | ICD-10-CM | POA: Diagnosis not present

## 2014-01-27 DIAGNOSIS — F909 Attention-deficit hyperactivity disorder, unspecified type: Secondary | ICD-10-CM

## 2014-01-27 DIAGNOSIS — M25552 Pain in left hip: Secondary | ICD-10-CM | POA: Diagnosis not present

## 2014-01-27 DIAGNOSIS — R058 Other specified cough: Secondary | ICD-10-CM

## 2014-01-27 DIAGNOSIS — F423 Hoarding disorder: Secondary | ICD-10-CM

## 2014-01-27 DIAGNOSIS — R413 Other amnesia: Secondary | ICD-10-CM

## 2014-01-27 DIAGNOSIS — F42 Obsessive-compulsive disorder: Secondary | ICD-10-CM | POA: Diagnosis not present

## 2014-01-27 DIAGNOSIS — E1129 Type 2 diabetes mellitus with other diabetic kidney complication: Secondary | ICD-10-CM

## 2014-01-27 DIAGNOSIS — R1012 Left upper quadrant pain: Secondary | ICD-10-CM

## 2014-01-27 DIAGNOSIS — R059 Cough, unspecified: Secondary | ICD-10-CM

## 2014-01-27 HISTORY — DX: Attention-deficit hyperactivity disorder, unspecified type: F90.9

## 2014-01-27 MED ORDER — DEXTROMETHORPHAN POLISTIREX 30 MG/5ML PO LQCR: ORAL | Status: DC

## 2014-01-27 MED ORDER — AMPHETAMINE-DEXTROAMPHETAMINE 10 MG PO TABS: ORAL_TABLET | ORAL | Status: DC

## 2014-01-27 MED ORDER — FAMOTIDINE 20 MG PO TABS: ORAL_TABLET | ORAL | Status: DC

## 2014-01-27 NOTE — Progress Notes (Signed)
Patient ID: Christine GuilesMary J Pierce, female   DOB: 1921-04-22, 80 y.o.   MRN: [id number]    Facility  [facility]    Place of Service:   OFFICE   Allergies  Allergen Reactions  . Macrodantin [Nitrofurantoin Macrocrystal]     Affects kidneys    Chief Complaint  Patient presents with  . Medical Management of Chronic Issues    3 month follow-up, discuss labs (copy printed)   . Follow-up    Discuss if patient needs to continue going to the [facility] or if Dr.Green can address UTI's   . Medication Refill    Discuss increasing tylenol or changing     HPI:  Patient has a multitude of problems and complaints today. This is usual for her. She speeds through the items and attempts to be "organized". Unfortunately she switches subjects so fast that it is difficult for me to catch all of the things she says.  Cough: Persistent dry cough which is unimproved. Possibility of reflux being the cause of this cough was raised by the pulmonary doctor. She has attempted to do the things he asked. She does not feel she is improving at all.  Diabetes mellitus with renal manifestations, controlled: Controlled  LUQ abdominal pain: Chronic and persistent. Under the left rib cage. No palpable masses. No nausea.  Hoarding behavior: Continues with her tendency to collect things  Memory deficits: Recently saw neurologist. He felt she had only a minimal cognitive deficit. He felt that her problems with organization may have been more related to psychiatric issues  Pain in hip, left: Although she has pain in both legs, today she complains that the right is worse than the left. Continues with discomfort in both hips. Patient previously has received epidural injections the last one was nearly a year ago. She says that the epidural injections have been very helpful and back discomfort as well as hip discomfort. She has seen Dr. Jillyn HiddenBean for this in the past. She will be seeing him again soon.  Tremor: Mild  Acute cystitis  without hematuria: History of urine infections and hematuria. Urine currently is clear and she denies dysuria.  Upper airway cough syndrome: Dry cough as noted above she thinks she may have pollen allergies. She would like something different for the cough. Currently using Mucinex DM. She wakes up at night with a dry cough and a dry mouth.  Dysphagia, pharyngoesophageal phase: Says that she is doing better and swallowing things quite well now. She denies any dysphagia or odynophagia.    Medications: Patient's Medications  New Prescriptions   No medications on file  Previous Medications   ACETAMINOPHEN (TYLENOL) 500 MG TABLET    Take 500 mg by mouth every 4 (four) hours as needed.   ALBUTEROL (PROVENTIL HFA;VENTOLIN HFA) 108 (90 BASE) MCG/ACT INHALER    Inhale 2 puffs into the lungs every 6 (six) hours as needed for wheezing.   ALPRAZOLAM (XANAX) 0.5 MG TABLET    Take 1 tablet (0.5 mg total) by mouth 2 (two) times daily as needed for anxiety.   ASPIRIN 81 MG CHEWABLE TABLET    Chew 81 mg by mouth daily.   CITALOPRAM (CELEXA) 20 MG TABLET    Take 1 tablet (20 mg total) by mouth daily.   DEXTROMETHORPHAN-GUAIFENESIN (MUCINEX DM) 30-600 MG PER 12 HR TABLET    Take 1 tablet by mouth 2 (two) times daily as needed for cough.   HYDROCORTISONE CREAM 1 %    Apply to pruritic or  irritated areas of legs twice daily as needed   LOVASTATIN (MEVACOR) 40 MG TABLET    Take 40 mg by mouth every morning.   MELATONIN 3 MG CAPS    Take 3 mg by mouth at bedtime as needed (Sleep).   MISC. DEVICES (ROLLER WALKER) MISC    Rolling Walker with seat and brakes.   MULTIPLE VITAMIN (MULTIVITAMIN WITH MINERALS) TABS TABLET    Take 1 tablet by mouth daily.   NITROGLYCERIN (NITROSTAT) 0.4 MG SL TABLET    Place 0.4 mg under the tongue every 5 (five) minutes as needed for chest pain.   OMEPRAZOLE (PRILOSEC) 40 MG CAPSULE    Take 40 mg by mouth 2 (two) times daily.   OXYBUTYNIN (DITROPAN-XL) 10 MG 24 HR TABLET    Take 10 mg  by mouth at bedtime.   SENNA-DOCUSATE (SENOKOT-S) 8.6-50 MG PER TABLET    One nightly to prevent constipation   TELMISARTAN-HYDROCHLOROTHIAZIDE (MICARDIS HCT) 80-12.5 MG PER TABLET    Take 1 tablet by mouth daily.  Modified Medications   No medications on file  Discontinued Medications   ACETAMINOPHEN (TYLENOL) 325 MG TABLET    Take 650 mg by mouth every 4 (four) hours as needed for pain.     Review of Systems  Constitutional: Positive for fatigue. Negative for fever, chills, diaphoresis, activity change, appetite change and unexpected weight change.  HENT: Positive for hearing loss.   Eyes: Negative.   Respiratory: Positive for cough and shortness of breath.        Complains of a hoarse voice for the last few months. There is no pain.  Cardiovascular: Positive for leg swelling. Negative for palpitations.  Gastrointestinal: Positive for constipation and rectal pain.       Occasional fecal incontinence. Sometimes has difficulty swallowing. Solids and liquids seem to be involved. There is no odynophagia.  Endocrine:       Elevated blood sugars. Currently on "dietary control". She is not following her diet closely.  Genitourinary: Positive for urgency and frequency. Negative for dysuria, decreased urine volume, vaginal bleeding, vaginal discharge, enuresis, genital sores, vaginal pain and pelvic pain.  Musculoskeletal:       Generalized weakness: Pain in the right hip area. Requires assistance in getting up and down. At risk for falls.  Skin: Negative.   Allergic/Immunologic: Negative.   Neurological: Positive for weakness.       Memory loss.  Hematological: Negative.   Psychiatric/Behavioral: Positive for confusion. The patient is nervous/anxious.        History of hoarding behavior. Impulsive buying of products on television.    Filed Vitals:   01/27/14 1546  BP: 132/86  Pulse: 66  Temp: 97.5 F (36.4 C)  TempSrc: Oral  Resp: 10  Height: 5\' 5"  (1.651 m)  Weight: 170 lb  (77.111 kg)  SpO2: 96%   Body mass index is 28.29 kg/(m^2).  Physical Exam  Constitutional: She is oriented to person, place, and time. No distress.  Overweight  HENT:  Head: Atraumatic.  Partial deafness. Uses hearing aids.  Eyes:  Wears prescription lenses.  Neck: No JVD present. No tracheal deviation present. No thyromegaly present.  Some tenderness with movement laterally to either side.  Cardiovascular: Normal rate and regular rhythm.  Exam reveals no friction rub.   Murmur (3/6 SEM at LSB and aortic ejection) heard. Bilateral varicose veins  Pulmonary/Chest: No respiratory distress. She has no wheezes. She has no rales. She exhibits no tenderness.  Tender left flank area lower  rib margins laterally and slightly anteriorly. Voice is slightly hoarse.  Abdominal: Soft. Bowel sounds are normal. She exhibits no distension and no mass. There is no tenderness.  Genitourinary: Guaiac negative stool.  Anal fissure at 6 o'clock  Musculoskeletal: Normal range of motion. She exhibits no edema or tenderness.  Tender the greater trochanter and right groin. She is hobbled by this decrease in her stride length.  Lymphadenopathy:    She has no cervical adenopathy.  Neurological: She is alert and oriented to person, place, and time. No cranial nerve deficit. She exhibits normal muscle tone. Coordination normal.  04/30/12 MMSE 27/30. Passed clock drawing. Having some trouble in sequencing events and in telling a coherent story.  Insensitive to vibration in the feet.  Skin: No rash noted. She is not diaphoretic. No erythema. No pallor.  Psychiatric: She has a normal mood and affect. Her behavior is normal. Judgment and thought content normal.     Labs reviewed: Appointment on 01/19/2014  Component Date Value Ref Range Status  . Glucose 01/19/2014 97  65 - 99 mg/dL Final  . BUN 40/98/1191 28  10 - 36 mg/dL Final  . Creatinine, Ser 01/19/2014 1.32* 0.57 - 1.00 mg/dL Final  . GFR calc non Af  Amer 01/19/2014 36* >59 mL/min/1.73 Final  . GFR calc Af Amer 01/19/2014 41* >59 mL/min/1.73 Final  . BUN/Creatinine Ratio 01/19/2014 21  11 - 26 Final  . Sodium 01/19/2014 139  134 - 144 mmol/L Final  . Potassium 01/19/2014 5.2  3.5 - 5.2 mmol/L Final  . Chloride 01/19/2014 102  97 - 108 mmol/L Final  . CO2 01/19/2014 22  18 - 29 mmol/L Final  . Calcium 01/19/2014 9.8  8.7 - 10.3 mg/dL Final  . Total Protein 01/19/2014 6.3  6.0 - 8.5 g/dL Final  . Albumin 47/82/9562 4.0  3.2 - 4.6 g/dL Final  . Globulin, Total 01/19/2014 2.3  1.5 - 4.5 g/dL Final  . Albumin/Globulin Ratio 01/19/2014 1.7  1.1 - 2.5 Final  . Total Bilirubin 01/19/2014 0.3  0.0 - 1.2 mg/dL Final  . Alkaline Phosphatase 01/19/2014 60  39 - 117 IU/L Final  . AST 01/19/2014 19  0 - 40 IU/L Final  . ALT 01/19/2014 9  0 - 32 IU/L Final  . Hgb A1c MFr Bld 01/19/2014 6.2* 4.8 - 5.6 % Final   Comment:          Pre-diabetes: 5.7 - 6.4          Diabetes: >6.4          Glycemic control for adults with diabetes: <7.0   . Est. average glucose Bld gHb Est-m* 01/19/2014 131   Final  Appointment on 12/01/2013  Component Date Value Ref Range Status  . Sodium 12/01/2013 135  135 - 145 mEq/L Final  . Potassium 12/01/2013 4.4  3.5 - 5.1 mEq/L Final  . Chloride 12/01/2013 102  96 - 112 mEq/L Final  . CO2 12/01/2013 25  19 - 32 mEq/L Final  . Glucose, Bld 12/01/2013 100* 70 - 99 mg/dL Final  . BUN 13/10/6576 34* 6 - 23 mg/dL Final  . Creatinine, Ser 12/01/2013 1.4* 0.4 - 1.2 mg/dL Final  . Calcium 46/96/2952 9.6  8.4 - 10.5 mg/dL Final  . GFR 84/13/2440 36.64* >60.00 mL/min Final  . Pro B Natriuretic peptide (BNP) 12/01/2013 187.0* 0.0 - 100.0 pg/mL Final  . WBC 12/01/2013 6.4  4.0 - 10.5 K/uL Final  . RBC 12/01/2013 3.88  3.87 - 5.11  Mil/uL Final  . Hemoglobin 12/01/2013 12.0  12.0 - 15.0 g/dL Final  . HCT 16/10/960409/28/2015 35.7* 36.0 - 46.0 % Final  . MCV 12/01/2013 92.1  78.0 - 100.0 fl Final  . MCHC 12/01/2013 33.5  30.0 - 36.0 g/dL  Final  . RDW 54/09/811909/28/2015 14.7  11.5 - 15.5 % Final  . Platelets 12/01/2013 231.0  150.0 - 400.0 K/uL Final  . Neutrophils Relative % 12/01/2013 58.0  43.0 - 77.0 % Final  . Lymphocytes Relative 12/01/2013 26.5  12.0 - 46.0 % Final  . Monocytes Relative 12/01/2013 11.0  3.0 - 12.0 % Final  . Eosinophils Relative 12/01/2013 3.9  0.0 - 5.0 % Final  . Basophils Relative 12/01/2013 0.6  0.0 - 3.0 % Final  . Neutro Abs 12/01/2013 3.7  1.4 - 7.7 K/uL Final  . Lymphs Abs 12/01/2013 1.7  0.7 - 4.0 K/uL Final  . Monocytes Absolute 12/01/2013 0.7  0.1 - 1.0 K/uL Final  . Eosinophils Absolute 12/01/2013 0.3  0.0 - 0.7 K/uL Final  . Basophils Absolute 12/01/2013 0.0  0.0 - 0.1 K/uL Final  . TSH 12/01/2013 1.43  0.35 - 4.50 uIU/mL Final  Appointment on 12/01/2013  Component Date Value Ref Range Status  . FVC-Pre 12/01/2013 1.97   Final  . FVC-%Pred-Pre 12/01/2013 86   Final  . FEV1-Pre 12/01/2013 1.05   Final  . FEV1-%Pred-Pre 12/01/2013 62   Final  . FEV6-Pre 12/01/2013 1.94   Final  . FEV6-%Pred-Pre 12/01/2013 91   Final  . Pre FEV1/FVC ratio 12/01/2013 53   Final  . FEV1FVC-%Pred-Pre 12/01/2013 74   Final  . Pre FEV6/FVC Ratio 12/01/2013 98   Final  . FEV6FVC-%Pred-Pre 12/01/2013 105   Final  . FEF 25-75 Pre 12/01/2013 0.61   Final  . FEF2575-%Pred-Pre 12/01/2013 65   Final     Assessment/Plan  1. Cough - dextromethorphan (DELSYM) 30 MG/5ML liquid; 10 cc every 12 hours to suppress cough  Dispense: 148 mL; Refill: 5 - famotidine (PEPCID) 20 MG tablet; One twice daily to suppress stomach acid  Dispense: 60 tablet; Refill: 5  2. Diabetes mellitus with renal manifestations, controlled controlled  3. LUQ abdominal pain uunchanged  4. Hoarding behavior unchanged  5. Memory deficits [facility] per DR Roda ShuttersXu, neurologist  6. Pain in hip, left Bilateral pains and pain in the right leg. Possiby related to spinal. Not doing well on current medications. Has had steroid epidural injections in the past  that she feels were helpful.  7. Tremor mild  8. Acute cystitis without hematuria Follow up UA and culture - Urinalysis - Urine culture  9. Upper airway cough syndrome Try Delsym  10. Dysphagia, pharyngoesophageal phase improved  11. Attention deficit hyperactivity disorder (ADHD), unspecified ADHD type - amphetamine-dextroamphetamine (ADDERALL) 10 MG tablet; One twice daily to help ADHD syndrome  Dispense: 60 tablet; Refill: 0

## 2014-01-28 LAB — URINALYSIS
Bilirubin, UA: NEGATIVE
GLUCOSE, UA: NEGATIVE
Ketones, UA: NEGATIVE
Nitrite, UA: POSITIVE — AB
Protein, UA: NEGATIVE
RBC, UA: NEGATIVE
Specific Gravity, UA: 1.017 (ref 1.005–1.030)
Urobilinogen, Ur: 0.2 mg/dL (ref 0.2–1.0)
pH, UA: 6.5 (ref 5.0–7.5)

## 2014-01-30 LAB — URINE CULTURE

## 2014-02-03 DIAGNOSIS — F330 Major depressive disorder, recurrent, mild: Secondary | ICD-10-CM | POA: Diagnosis not present

## 2014-02-09 ENCOUNTER — Telehealth: Payer: Self-pay | Admitting: *Deleted

## 2014-02-09 MED ORDER — AMPHETAMINE-DEXTROAMPHETAMINE 5 MG PO TABS: ORAL_TABLET | ORAL | Status: DC

## 2014-02-09 NOTE — Telephone Encounter (Signed)
[facility] # [phone] dropped off a MAR to be reviewed and signed by Dr. Chilton SiGreen. Dr. Chilton SiGreen Signed and Malachi Paradisearmella Notified to pick up and left up front for pick up. Also received a fax from [facility] regarding patient's POA [name] requested a reduction in the Adderall 10 mg twice daily to 5mg  twice daily. Her request is made based on the fact that she believes Corrie DandyMary is clenching her teeth and speaking without opening her jaw fully. [facility] Fax: [phone]

## 2014-02-10 DIAGNOSIS — B351 Tinea unguium: Secondary | ICD-10-CM | POA: Diagnosis not present

## 2014-02-10 DIAGNOSIS — L602 Onychogryphosis: Secondary | ICD-10-CM | POA: Diagnosis not present

## 2014-02-10 DIAGNOSIS — M79673 Pain in unspecified foot: Secondary | ICD-10-CM | POA: Diagnosis not present

## 2014-02-10 DIAGNOSIS — F330 Major depressive disorder, recurrent, mild: Secondary | ICD-10-CM | POA: Diagnosis not present

## 2014-02-10 DIAGNOSIS — R269 Unspecified abnormalities of gait and mobility: Secondary | ICD-10-CM | POA: Diagnosis not present

## 2014-02-11 ENCOUNTER — Other Ambulatory Visit: Payer: Self-pay

## 2014-02-11 MED ORDER — AMPHETAMINE-DEXTROAMPHETAMINE 5 MG PO TABS: ORAL_TABLET | ORAL | Status: DC

## 2014-02-11 NOTE — Telephone Encounter (Signed)
[facility] called indicating the pharmacy needs a hard-script for Adderall in order to fill

## 2014-02-12 DIAGNOSIS — J449 Chronic obstructive pulmonary disease, unspecified: Secondary | ICD-10-CM | POA: Diagnosis not present

## 2014-02-12 DIAGNOSIS — Z0000 Encounter for general adult medical examination without abnormal findings: Secondary | ICD-10-CM | POA: Diagnosis not present

## 2014-02-13 ENCOUNTER — Emergency Department (HOSPITAL_COMMUNITY): Payer: Medicare Other

## 2014-02-13 ENCOUNTER — Encounter (HOSPITAL_COMMUNITY): Payer: Self-pay | Admitting: Internal Medicine

## 2014-02-13 ENCOUNTER — Inpatient Hospital Stay (HOSPITAL_COMMUNITY)
Admission: EM | Admit: 2014-02-13 | Discharge: 2014-02-21 | DRG: 516 | Disposition: A | Discharge status: Skilled Nursing Facility | Payer: Medicare Other | Attending: Internal Medicine | Admitting: Internal Medicine

## 2014-02-13 DIAGNOSIS — Z881 Allergy status to other antibiotic agents status: Secondary | ICD-10-CM

## 2014-02-13 DIAGNOSIS — S3992XA Unspecified injury of lower back, initial encounter: Secondary | ICD-10-CM | POA: Diagnosis not present

## 2014-02-13 DIAGNOSIS — J449 Chronic obstructive pulmonary disease, unspecified: Secondary | ICD-10-CM | POA: Diagnosis present

## 2014-02-13 DIAGNOSIS — S32020S Wedge compression fracture of second lumbar vertebra, sequela: Secondary | ICD-10-CM | POA: Diagnosis not present

## 2014-02-13 DIAGNOSIS — M4806 Spinal stenosis, lumbar region: Secondary | ICD-10-CM | POA: Diagnosis present

## 2014-02-13 DIAGNOSIS — K5730 Diverticulosis of large intestine without perforation or abscess without bleeding: Secondary | ICD-10-CM | POA: Diagnosis present

## 2014-02-13 DIAGNOSIS — R05 Cough: Secondary | ICD-10-CM

## 2014-02-13 DIAGNOSIS — M549 Dorsalgia, unspecified: Secondary | ICD-10-CM | POA: Diagnosis not present

## 2014-02-13 DIAGNOSIS — E1122 Type 2 diabetes mellitus with diabetic chronic kidney disease: Secondary | ICD-10-CM | POA: Diagnosis present

## 2014-02-13 DIAGNOSIS — M810 Age-related osteoporosis without current pathological fracture: Secondary | ICD-10-CM | POA: Diagnosis present

## 2014-02-13 DIAGNOSIS — S4991XA Unspecified injury of right shoulder and upper arm, initial encounter: Secondary | ICD-10-CM | POA: Diagnosis not present

## 2014-02-13 DIAGNOSIS — Z9889 Other specified postprocedural states: Secondary | ICD-10-CM | POA: Diagnosis not present

## 2014-02-13 DIAGNOSIS — S199XXA Unspecified injury of neck, initial encounter: Secondary | ICD-10-CM | POA: Diagnosis not present

## 2014-02-13 DIAGNOSIS — H9193 Unspecified hearing loss, bilateral: Secondary | ICD-10-CM | POA: Diagnosis present

## 2014-02-13 DIAGNOSIS — W19XXXA Unspecified fall, initial encounter: Secondary | ICD-10-CM | POA: Diagnosis present

## 2014-02-13 DIAGNOSIS — E780 Pure hypercholesterolemia: Secondary | ICD-10-CM | POA: Diagnosis present

## 2014-02-13 DIAGNOSIS — M545 Low back pain: Secondary | ICD-10-CM

## 2014-02-13 DIAGNOSIS — S299XXA Unspecified injury of thorax, initial encounter: Secondary | ICD-10-CM | POA: Diagnosis not present

## 2014-02-13 DIAGNOSIS — N182 Chronic kidney disease, stage 2 (mild): Secondary | ICD-10-CM | POA: Diagnosis not present

## 2014-02-13 DIAGNOSIS — M4856XA Collapsed vertebra, not elsewhere classified, lumbar region, initial encounter for fracture: Secondary | ICD-10-CM | POA: Diagnosis not present

## 2014-02-13 DIAGNOSIS — K5900 Constipation, unspecified: Secondary | ICD-10-CM | POA: Diagnosis not present

## 2014-02-13 DIAGNOSIS — I129 Hypertensive chronic kidney disease with stage 1 through stage 4 chronic kidney disease, or unspecified chronic kidney disease: Secondary | ICD-10-CM | POA: Diagnosis present

## 2014-02-13 DIAGNOSIS — R279 Unspecified lack of coordination: Secondary | ICD-10-CM | POA: Diagnosis not present

## 2014-02-13 DIAGNOSIS — S32000S Wedge compression fracture of unspecified lumbar vertebra, sequela: Secondary | ICD-10-CM | POA: Diagnosis not present

## 2014-02-13 DIAGNOSIS — S32020D Wedge compression fracture of second lumbar vertebra, subsequent encounter for fracture with routine healing: Secondary | ICD-10-CM | POA: Diagnosis not present

## 2014-02-13 DIAGNOSIS — E1129 Type 2 diabetes mellitus with other diabetic kidney complication: Secondary | ICD-10-CM | POA: Diagnosis not present

## 2014-02-13 DIAGNOSIS — F419 Anxiety disorder, unspecified: Secondary | ICD-10-CM | POA: Diagnosis present

## 2014-02-13 DIAGNOSIS — G8929 Other chronic pain: Secondary | ICD-10-CM | POA: Diagnosis not present

## 2014-02-13 DIAGNOSIS — G2581 Restless legs syndrome: Secondary | ICD-10-CM | POA: Diagnosis present

## 2014-02-13 DIAGNOSIS — F411 Generalized anxiety disorder: Secondary | ICD-10-CM | POA: Diagnosis present

## 2014-02-13 DIAGNOSIS — M25552 Pain in left hip: Secondary | ICD-10-CM | POA: Diagnosis present

## 2014-02-13 DIAGNOSIS — T148 Other injury of unspecified body region: Secondary | ICD-10-CM | POA: Diagnosis not present

## 2014-02-13 DIAGNOSIS — K089 Disorder of teeth and supporting structures, unspecified: Secondary | ICD-10-CM | POA: Diagnosis present

## 2014-02-13 DIAGNOSIS — Y92129 Unspecified place in nursing home as the place of occurrence of the external cause: Secondary | ICD-10-CM | POA: Diagnosis not present

## 2014-02-13 DIAGNOSIS — M25559 Pain in unspecified hip: Secondary | ICD-10-CM

## 2014-02-13 DIAGNOSIS — Z86711 Personal history of pulmonary embolism: Secondary | ICD-10-CM | POA: Insufficient documentation

## 2014-02-13 DIAGNOSIS — F329 Major depressive disorder, single episode, unspecified: Secondary | ICD-10-CM | POA: Diagnosis present

## 2014-02-13 DIAGNOSIS — Z6828 Body mass index (BMI) 28.0-28.9, adult: Secondary | ICD-10-CM

## 2014-02-13 DIAGNOSIS — E559 Vitamin D deficiency, unspecified: Secondary | ICD-10-CM | POA: Diagnosis present

## 2014-02-13 DIAGNOSIS — Z7982 Long term (current) use of aspirin: Secondary | ICD-10-CM | POA: Diagnosis not present

## 2014-02-13 DIAGNOSIS — R278 Other lack of coordination: Secondary | ICD-10-CM | POA: Diagnosis not present

## 2014-02-13 DIAGNOSIS — R260 Ataxic gait: Secondary | ICD-10-CM | POA: Diagnosis not present

## 2014-02-13 DIAGNOSIS — Z79899 Other long term (current) drug therapy: Secondary | ICD-10-CM

## 2014-02-13 DIAGNOSIS — B9620 Unspecified Escherichia coli [E. coli] as the cause of diseases classified elsewhere: Secondary | ICD-10-CM | POA: Diagnosis present

## 2014-02-13 DIAGNOSIS — M6281 Muscle weakness (generalized): Secondary | ICD-10-CM | POA: Diagnosis not present

## 2014-02-13 DIAGNOSIS — S329XXA Fracture of unspecified parts of lumbosacral spine and pelvis, initial encounter for closed fracture: Secondary | ICD-10-CM | POA: Diagnosis not present

## 2014-02-13 DIAGNOSIS — S32028A Other fracture of second lumbar vertebra, initial encounter for closed fracture: Secondary | ICD-10-CM | POA: Diagnosis not present

## 2014-02-13 DIAGNOSIS — N390 Urinary tract infection, site not specified: Secondary | ICD-10-CM | POA: Diagnosis present

## 2014-02-13 DIAGNOSIS — E785 Hyperlipidemia, unspecified: Secondary | ICD-10-CM | POA: Diagnosis present

## 2014-02-13 DIAGNOSIS — S32020A Wedge compression fracture of second lumbar vertebra, initial encounter for closed fracture: Secondary | ICD-10-CM

## 2014-02-13 DIAGNOSIS — S79912A Unspecified injury of left hip, initial encounter: Secondary | ICD-10-CM | POA: Diagnosis not present

## 2014-02-13 DIAGNOSIS — S32000A Wedge compression fracture of unspecified lumbar vertebra, initial encounter for closed fracture: Secondary | ICD-10-CM | POA: Diagnosis present

## 2014-02-13 DIAGNOSIS — R059 Cough, unspecified: Secondary | ICD-10-CM

## 2014-02-13 DIAGNOSIS — IMO0002 Reserved for concepts with insufficient information to code with codable children: Secondary | ICD-10-CM

## 2014-02-13 DIAGNOSIS — R0602 Shortness of breath: Secondary | ICD-10-CM | POA: Diagnosis not present

## 2014-02-13 DIAGNOSIS — E669 Obesity, unspecified: Secondary | ICD-10-CM | POA: Diagnosis present

## 2014-02-13 DIAGNOSIS — E1121 Type 2 diabetes mellitus with diabetic nephropathy: Secondary | ICD-10-CM | POA: Diagnosis not present

## 2014-02-13 DIAGNOSIS — N179 Acute kidney failure, unspecified: Secondary | ICD-10-CM | POA: Diagnosis not present

## 2014-02-13 DIAGNOSIS — S0990XA Unspecified injury of head, initial encounter: Secondary | ICD-10-CM | POA: Diagnosis not present

## 2014-02-13 DIAGNOSIS — M79621 Pain in right upper arm: Secondary | ICD-10-CM | POA: Diagnosis not present

## 2014-02-13 HISTORY — DX: Personal history of pulmonary embolism: Z86.711

## 2014-02-13 HISTORY — PX: VERTEBROPLASTY: SHX113

## 2014-02-13 LAB — URINALYSIS, ROUTINE W REFLEX MICROSCOPIC
BILIRUBIN URINE: NEGATIVE
Glucose, UA: NEGATIVE mg/dL
Hgb urine dipstick: NEGATIVE
KETONES UR: NEGATIVE mg/dL
NITRITE: NEGATIVE
Protein, ur: NEGATIVE mg/dL
Specific Gravity, Urine: 1.01 (ref 1.005–1.030)
Urobilinogen, UA: 0.2 mg/dL (ref 0.0–1.0)
pH: 6.5 (ref 5.0–8.0)

## 2014-02-13 LAB — BASIC METABOLIC PANEL
ANION GAP: 13 (ref 5–15)
BUN: 33 mg/dL — AB (ref 6–23)
CO2: 23 mEq/L (ref 19–32)
Calcium: 10.1 mg/dL (ref 8.4–10.5)
Chloride: 104 mEq/L (ref 96–112)
Creatinine, Ser: 1.34 mg/dL — ABNORMAL HIGH (ref 0.50–1.10)
GFR, EST AFRICAN AMERICAN: 39 mL/min — AB (ref >90)
GFR, EST NON AFRICAN AMERICAN: 34 mL/min — AB (ref >90)
Glucose, Bld: 94 mg/dL (ref 70–99)
Potassium: 4.8 mEq/L (ref 3.7–5.3)
SODIUM: 140 meq/L (ref 137–147)

## 2014-02-13 LAB — CBC
HCT: 35 % — ABNORMAL LOW (ref 36.0–46.0)
Hemoglobin: 11.6 g/dL — ABNORMAL LOW (ref 12.0–15.0)
MCH: 30.4 pg (ref 26.0–34.0)
MCHC: 33.1 g/dL (ref 30.0–36.0)
MCV: 91.9 fL (ref 78.0–100.0)
PLATELETS: 174 10*3/uL (ref 150–400)
RBC: 3.81 MIL/uL — ABNORMAL LOW (ref 3.87–5.11)
RDW: 14.5 % (ref 11.5–15.5)
WBC: 10.7 10*3/uL — ABNORMAL HIGH (ref 4.0–10.5)

## 2014-02-13 LAB — URINE MICROSCOPIC-ADD ON

## 2014-02-13 LAB — I-STAT TROPONIN, ED: Troponin i, poc: 0.07 ng/mL (ref 0.00–0.08)

## 2014-02-13 MED ORDER — ADULT MULTIVITAMIN W/MINERALS CH
1.0000 | ORAL_TABLET | Freq: Every day | ORAL | Status: DC
  Administered 2014-02-14 – 2014-02-21 (×8): 1 via ORAL
  Filled 2014-02-13 (×8): qty 1

## 2014-02-13 MED ORDER — CITALOPRAM HYDROBROMIDE 20 MG PO TABS
20.0000 mg | ORAL_TABLET | Freq: Every day | ORAL | Status: DC
  Administered 2014-02-14 – 2014-02-20 (×8): 20 mg via ORAL
  Filled 2014-02-13 (×9): qty 1

## 2014-02-13 MED ORDER — MELATONIN 3 MG PO CAPS
3.0000 mg | ORAL_CAPSULE | Freq: Every evening | ORAL | Status: DC | PRN
  Filled 2014-02-13: qty 1

## 2014-02-13 MED ORDER — SENNOSIDES-DOCUSATE SODIUM 8.6-50 MG PO TABS
1.0000 | ORAL_TABLET | Freq: Every day | ORAL | Status: DC
  Administered 2014-02-14 – 2014-02-20 (×8): 1 via ORAL
  Filled 2014-02-13 (×8): qty 1

## 2014-02-13 MED ORDER — ACETAMINOPHEN 500 MG PO TABS
500.0000 mg | ORAL_TABLET | ORAL | Status: DC | PRN
  Administered 2014-02-14 – 2014-02-20 (×8): 500 mg via ORAL
  Filled 2014-02-13 (×8): qty 1

## 2014-02-13 MED ORDER — TELMISARTAN-HCTZ 80-12.5 MG PO TABS: 1.0000 | ORAL_TABLET | Freq: Every day | ORAL | Status: DC

## 2014-02-13 MED ORDER — HYDROCODONE-ACETAMINOPHEN 5-325 MG PO TABS
1.0000 | ORAL_TABLET | Freq: Once | ORAL | Status: AC
  Administered 2014-02-13: 1 via ORAL
  Filled 2014-02-13: qty 1

## 2014-02-13 MED ORDER — CEFTRIAXONE SODIUM IN DEXTROSE 20 MG/ML IV SOLN
1.0000 g | INTRAVENOUS | Status: DC
  Administered 2014-02-14 – 2014-02-17 (×4): 1 g via INTRAVENOUS
  Filled 2014-02-13 (×5): qty 50

## 2014-02-13 MED ORDER — AMPHETAMINE-DEXTROAMPHETAMINE 10 MG PO TABS
5.0000 mg | ORAL_TABLET | Freq: Two times a day (BID) | ORAL | Status: DC
  Administered 2014-02-14 – 2014-02-19 (×12): 5 mg via ORAL
  Filled 2014-02-13 (×12): qty 1

## 2014-02-13 MED ORDER — ASPIRIN 81 MG PO CHEW
81.0000 mg | CHEWABLE_TABLET | Freq: Every day | ORAL | Status: DC
  Administered 2014-02-14 – 2014-02-21 (×8): 81 mg via ORAL
  Filled 2014-02-13 (×8): qty 1

## 2014-02-13 MED ORDER — DEXTROSE 5 % IV SOLN
1.0000 g | Freq: Once | INTRAVENOUS | Status: AC
  Administered 2014-02-13: 1 g via INTRAVENOUS
  Filled 2014-02-13: qty 10

## 2014-02-13 MED ORDER — OXYBUTYNIN CHLORIDE ER 10 MG PO TB24
10.0000 mg | ORAL_TABLET | Freq: Every day | ORAL | Status: DC
  Administered 2014-02-14 – 2014-02-20 (×8): 10 mg via ORAL
  Filled 2014-02-13 (×9): qty 1

## 2014-02-13 MED ORDER — PRAVASTATIN SODIUM 40 MG PO TABS
40.0000 mg | ORAL_TABLET | Freq: Every day | ORAL | Status: DC
  Administered 2014-02-14 – 2014-02-20 (×6): 40 mg via ORAL
  Filled 2014-02-13 (×8): qty 1

## 2014-02-13 MED ORDER — FAMOTIDINE 20 MG PO TABS
20.0000 mg | ORAL_TABLET | Freq: Two times a day (BID) | ORAL | Status: DC
  Administered 2014-02-14 – 2014-02-21 (×16): 20 mg via ORAL
  Filled 2014-02-13 (×17): qty 1

## 2014-02-13 MED ORDER — ALBUTEROL SULFATE (2.5 MG/3ML) 0.083% IN NEBU: 2.5000 mg | INHALATION_SOLUTION | Freq: Four times a day (QID) | RESPIRATORY_TRACT | Status: DC | PRN

## 2014-02-13 MED ORDER — HEPARIN SODIUM (PORCINE) 5000 UNIT/ML IJ SOLN
5000.0000 [IU] | Freq: Three times a day (TID) | INTRAMUSCULAR | Status: DC
  Administered 2014-02-14 – 2014-02-19 (×15): 5000 [IU] via SUBCUTANEOUS
  Filled 2014-02-13 (×18): qty 1

## 2014-02-13 NOTE — ED Provider Notes (Signed)
CSN: [id number]     Arrival date & time 02/13/14  1725 History   First MD Initiated Contact with Patient 02/13/14 1726     Chief Complaint  Patient presents with  . Fall     (Consider location/radiation/quality/duration/timing/severity/associated sxs/prior Treatment) HPI Comments: 80 year old female with an extensive past medical history presenting via EMS from [facility] assisted living facility after a fall earlier this evening. Patient initially refused transport. Patient reports she was trying to put her mail onto the table and she "just fell". States she was feeling fine, denies feeling lightheaded, weak or dizzy prior to the fall. She does not believe she tripped. States she landed onto her buttock area and is experiencing low back pain. Pain radiates across her lower back and is similar to her sciatic pain that she's had in the past. Denies any radiation down her legs. Denies numbness or tingling. She reports mild right-sided arm pain. Denies headache, vision change, confusion, nausea, vomiting, abdominal pain or chest pain. States after she fell, she felt slightly short of breath when she was trying to get back up. It is noted pt has a dry mouth, however pt states this is normal for her due to her medications.  Patient is a 80 y.o. female presenting with fall. The history is provided by the patient and the EMS personnel.  Fall    Past Medical History  Diagnosis Date  . Hypertension 10/23/2011  . Shortness of breath 04/30/2012  . Other nonspecific abnormal finding of lung field   . Contact with or exposure to unspecified communicable disease   . Cystocele, midline 04/10/2012  . Cough 04/10/2012  . Full incontinence of feces 04/10/2012  . Urgency of urination 04/10/2012  . Abdominal pain, other specified site   . Orthopnea 12/01/2011  . Type II or unspecified type diabetes mellitus without mention of complication, not stated as uncontrolled 11/16/2009  . Other and  unspecified hyperlipidemia 10/20/2011  . Urinary tract infection, site not specified 09/11/2011  . Muscle weakness (generalized) 09/11/2011  . Syncope and collapse 03/29/2011  . Pain in joint, ankle and foot 12/27/2010  . Sprain of ribs 08/15/2010  . Unspecified tinnitus 07/04/2010  . Pneumonia, organism unspecified 05/04/2010  . Diverticulosis of colon (without mention of hemorrhage)   . Other and unspecified ovarian cyst 05/04/2010  . Abdominal pain, right lower quadrant 05/02/2010  . Anxiety state, unspecified 02/14/2010  . Shortness of breath 01/05/2010  . Nausea alone 12/13/2009  . Dehydration   . Pain in joint, shoulder region 11/15/2009  . Other abnormal blood chemistry   . Pain in joint, pelvic region and thigh 07/29/2009  . Personal history of fall 07/29/2009  . Other malaise and fatigue 02/15/2009  . Obesity, unspecified 07/16/2008  . Unspecified hereditary and idiopathic peripheral neuropathy   . Reflux esophagitis 10/18/2007  . Unspecified vitamin D deficiency 07/19/2007  . Chest pain, unspecified 07/19/2007  . Varicose veins of lower extremities with inflammation 04/25/2006  . Restless legs syndrome (RLS) 01/16/2006  . Nocturia 07/07/2005  . Encounter for long-term (current) use of other medications   . Cervicalgia 01/20/2004  . Osteoporosis, unspecified 01/23/2003  . Sciatica 04/03/2001  . Routine general medical examination at a health care facility 11/15/2009  . Pain in joint, lower leg 12/11/2001  . Lumbago 02/20/2001  . Hard of hearing     both ears  . Renal disorder     last saw dr deterding 2 months ago, all new meds must be oked thru dr  deterding  . Chronic kidney disease, stage III (moderate) 12/15/09  . Acute kidney failure, unspecified 12/10/2009  . Hydronephrosis 07/16/2008  . Cold   . Hypertension   . Diabetes     MANAGED WITH DIET AND EXERCISE  . Anxiety   . High cholesterol   . Depression    Past Surgical History  Procedure Laterality  Date  . Appendectomy    . Stapedectomy Bilateral [phone]    middle ear surgery  . Eye surgery Left 1988    cataract w IOL implant, Dr. Vic Ripper  . Fracture surgery Right 09/2004    distal radius/Dr. Teressa Senter  . Tonsillectomy and adenoidectomy  age 80  . Esophagogastroduodenoscopy (egd) with propofol N/A 09/03/2013    Procedure: ESOPHAGOGASTRODUODENOSCOPY (EGD) WITH PROPOFOL;  Surgeon: Willis Modena, MD;  Location: [facility];  Service: Endoscopy;  Laterality: N/A;  . Botox injection N/A 09/03/2013    Procedure: BOTOX INJECTION;  Surgeon: Willis Modena, MD;  Location: [facility];  Service: Endoscopy;  Laterality: N/A;   Family History  Problem Relation Age of Onset  . Cancer Mother     abdomen  . Cancer Father     brain, lung  . Heart disease Son    History  Substance Use Topics  . Smoking status: Never Smoker   . Smokeless tobacco: Never Used  . Alcohol Use: No   OB History    No data available     Review of Systems  10 Systems reviewed and are negative for acute change except as noted in the HPI.  Allergies  Macrodantin  Home Medications   Prior to Admission medications   Medication Sig Start Date End Date Taking? Authorizing Provider  acetaminophen (TYLENOL) 500 MG tablet Take 500 mg by mouth every 4 (four) hours as needed (pain).    Yes Historical Provider, MD  albuterol (PROVENTIL HFA;VENTOLIN HFA) 108 (90 BASE) MCG/ACT inhaler Inhale 2 puffs into the lungs every 6 (six) hours as needed for wheezing. 12/31/12  Yes Kimber Relic, MD  ALPRAZolam Prudy Feeler) 0.5 MG tablet Take 1 tablet (0.5 mg total) by mouth 2 (two) times daily as needed for anxiety. 09/04/13  Yes Sharon Seller, NP  amphetamine-dextroamphetamine (ADDERALL) 5 MG tablet Take one tablet by mouth twice daily for ADHD Patient taking differently: Take 5 mg by mouth 2 (two) times daily.  02/11/14  Yes Roena Malady, PA-C  aspirin 81 MG chewable tablet Chew 81 mg by mouth daily.   Yes Historical Provider, MD   citalopram (CELEXA) 20 MG tablet Take 1 tablet (20 mg total) by mouth daily. Patient taking differently: Take 20 mg by mouth at bedtime.  10/21/13  Yes Kimber Relic, MD  dextromethorphan (DELSYM) 30 MG/5ML liquid 10 cc every 12 hours to suppress cough Patient taking differently: Take 60 mg by mouth every 12 (twelve) hours.  01/27/14  Yes Kimber Relic, MD  dextromethorphan-guaiFENesin [facility] DM) 30-600 MG per 12 hr tablet Take 1 tablet by mouth every 12 (twelve) hours as needed for cough (congestion).   Yes Historical Provider, MD  famotidine (PEPCID) 20 MG tablet One twice daily to suppress stomach acid Patient taking differently: Take 20 mg by mouth 2 (two) times daily.  01/27/14  Yes Kimber Relic, MD  hydrocortisone cream 1 % Apply to pruritic or irritated areas of legs twice daily as needed Patient taking differently: Apply 1 application topically 2 (two) times daily as needed for itching. Apply to pruritic or irritated areas of legs twice  daily as needed 07/30/13  Yes Kimber RelicArthur G Green, MD  lovastatin (MEVACOR) 40 MG tablet Take 40 mg by mouth every morning.   Yes Historical Provider, MD  Melatonin 3 MG CAPS Take 3 mg by mouth at bedtime as needed (Sleep).   Yes Historical Provider, MD  Multiple Vitamin (MULTIVITAMIN WITH MINERALS) TABS tablet Take 1 tablet by mouth daily.   Yes Historical Provider, MD  nitroGLYCERIN (NITROSTAT) 0.4 MG SL tablet Place 0.4 mg under the tongue every 5 (five) minutes as needed for chest pain.   Yes Historical Provider, MD  omeprazole (PRILOSEC) 40 MG capsule Take 40 mg by mouth 2 (two) times daily.   Yes Historical Provider, MD  oxybutynin (DITROPAN-XL) 10 MG 24 hr tablet Take 10 mg by mouth at bedtime.   Yes Historical Provider, MD  senna-docusate (SENOKOT-S) 8.6-50 MG per tablet One nightly to prevent constipation Patient taking differently: Take 1 tablet by mouth at bedtime.  10/21/13  Yes Kimber RelicArthur G Green, MD  telmisartan-hydrochlorothiazide (MICARDIS HCT)  80-12.5 MG per tablet Take 1 tablet by mouth daily. 09/17/13  Yes Nyoka CowdenMichael B Wert, MD  trolamine salicylate (ASPERCREME) 10 % cream Apply 1 application topically 3 (three) times daily. Right grater trochanteric bursa area   Yes Historical Provider, MD  amphetamine-dextroamphetamine (ADDERALL) 5 MG tablet Take one tablet by mouth twice daily for ADHD Patient not taking: Reported on 02/13/2014 02/11/14   Roena MaladyArlo C Lassen, PA-C  Misc. Devices (ROLLER WALKER) MISC Rolling Walker with seat and brakes. 11/18/12   Kimber RelicArthur G Green, MD   BP 167/111 mmHg  Pulse 68  Temp(Src) 98.7 F (37.1 C) (Oral)  Resp 18  Ht 5\' 7"  (1.702 m)  Wt 160 lb (72.576 kg)  BMI 25.05 kg/m2  SpO2 100% Physical Exam  Constitutional: She is oriented to person, place, and time. She appears well-developed and well-nourished. No distress.  HENT:  Head: Normocephalic and atraumatic.  Right Ear: No hemotympanum.  Left Ear: No hemotympanum.  Dry MM.  Eyes: Conjunctivae are normal. Pupils are equal, round, and reactive to light.  Horizontal nystagmus on horizontal gaze both left and right.  Neck: Normal range of motion. Neck supple. No JVD present. No spinous process tenderness and no muscular tenderness present.  Cardiovascular: Normal rate, regular rhythm and intact distal pulses.   Murmur heard.  Systolic murmur is present with a grade of 4/6  Pulmonary/Chest: Effort normal and breath sounds normal. No respiratory distress. She has no wheezes. She has no rales. She exhibits no tenderness.  Abdominal: Soft. Bowel sounds are normal. There is no tenderness.  Musculoskeletal: Normal range of motion. She exhibits no edema.  TTP across lower back. FROM bilateral hips, pain to lower back with movement. No TTP bilateral hips or pelvic bone. No deformity. Mild TTP over right distal humerus. No swelling or deformity. Full elbow ROM.  Neurological: She is alert and oriented to person, place, and time. She has normal strength. No sensory  deficit. GCS eye subscore is 4. GCS verbal subscore is 5. GCS motor subscore is 6.  Speech fluent, goal oriented. Moves limbs without ataxia. Equal grip strength bilateral.  Skin: Skin is warm and dry. She is not diaphoretic.  Psychiatric: She has a normal mood and affect. Her behavior is normal.  Nursing note and vitals reviewed.   ED Course  Procedures (including critical care time) Labs Review Labs Reviewed  CBC - Abnormal; Notable for the following:    WBC 10.7 (*)    RBC 3.81 (*)  Hemoglobin 11.6 (*)    HCT 35.0 (*)    All other components within normal limits  BASIC METABOLIC PANEL - Abnormal; Notable for the following:    BUN 33 (*)    Creatinine, Ser 1.34 (*)    GFR calc non Af Amer 34 (*)    GFR calc Af Amer 39 (*)    All other components within normal limits  URINALYSIS, ROUTINE W REFLEX MICROSCOPIC - Abnormal; Notable for the following:    APPearance CLOUDY (*)    Leukocytes, UA MODERATE (*)    All other components within normal limits  URINE MICROSCOPIC-ADD ON - Abnormal; Notable for the following:    Squamous Epithelial / LPF FEW (*)    Bacteria, UA MANY (*)    All other components within normal limits  URINE CULTURE  I-STAT TROPOININ, ED    Imaging Review Dg Chest 2 View  02/13/2014   CLINICAL DATA:  [age] year old female status post fall  EXAM: CHEST  2 VIEW  COMPARISON:  Prior chest x-ray 09/17/2013  FINDINGS: Stable cardiac and mediastinal contours. Atherosclerotic calcifications present in the aorta. No evidence of a pneumothorax or pleural effusion. Stable central bronchitic change of a mild severity. And upper lumbar compression fracture is incompletely imaged. This represents a new finding compared to 09/10/2012. However, the appearance is suggestive of a chronic injury.  IMPRESSION: 1. No active cardiopulmonary disease. 2. Incompletely imaged upper lumbar compression fracture is an interval finding compared to the prior chest CT from 09/10/2012. However,  the appearance suggests a remote injury. 3. Aortic atherosclerosis.   Electronically Signed   By: Malachy MoanHeath  McCullough M.D.   On: 02/13/2014 20:29   Dg Lumbar Spine Complete  02/13/2014   CLINICAL DATA:  Fall with low back pain.  EXAM: LUMBAR SPINE - COMPLETE 4+ VIEW  COMPARISON:  05/02/2010 and 09/22/2008 as well as CT 05/02/2010  FINDINGS: Exam demonstrates diffuse decreased bone mineralization. Exam demonstrates mild spondylosis of the lumbar spine to include facet arthropathy. There is a moderate compression fracture of L2 new since 2012 and likely an acute injury. There is prominent deformity of the anterior superior aspect of the vertebral body. No retropulsion component. There is mild grade 1 anterolisthesis of L4 on L5 unchanged. Disc space narrowing at the L5-S1 level unchanged. T11-12 calcified disc unchanged. Subtle lucency along the superior border of the left sacral canal as cannot completely exclude a fracture in this location.  IMPRESSION: Moderate L2 compression fracture likely acute.  Subtle lucency over the superior border of the left sacral L eft as cannot exclude a fracture.  Mild spondylosis of the lumbar spine with disc disease at the L5-S1 level.  Stable grade 1 anterolisthesis of L4 on L5.   Electronically Signed   By: Elberta Fortisaniel  Boyle M.D.   On: 02/13/2014 20:30   Ct Head Wo Contrast  02/13/2014   CLINICAL DATA:  [age] year old who fell while at the nursing home earlier today.  EXAM: CT HEAD WITHOUT CONTRAST  CT CERVICAL SPINE WITHOUT CONTRAST  TECHNIQUE: Multidetector CT imaging of the head and cervical spine was performed following the standard protocol without intravenous contrast. Multiplanar CT image reconstructions of the cervical spine were also generated.  COMPARISON:  CT head and cervical spine 04/11/2012.  FINDINGS: CT HEAD FINDINGS  Moderate to severe age related cortical and deep atrophy, unchanged. Moderate changes of small vessel disease of the cerebral white matter, unchanged.  No mass lesion. No midline shift. No acute hemorrhage or hematoma. No extra-axial  fluid collections. No evidence of acute infarction. No significant interval change.  No skull fracture or other focal osseous abnormality involving the skull. Mucosal thickening involving the right maxillary sinus. Remaining visualized paranasal sinuses, bilateral mastoid air cells and bilateral middle ear cavities well aerated. Lucency involving the roots of the 3 most posterior right maxillary teeth; this area was not imaged on the prior examination.  CT CERVICAL SPINE FINDINGS  No fractures identified involving the cervical spine. Sagittal reconstructed images demonstrate anatomic alignment and exaggeration of the usual cervical lordosis. Facet joints intact throughout with severe degenerative changes and fusion of the left facets at C3 and C4. Borderline multifactorial spinal stenosis at C3-4. Chronic central disc protrusion at C3-4 with calcification in the posterior annular fibers. Vacuum disc phenomenon at C5-6. Coronal reformatted images demonstrate an intact craniocervical junction, intact dens, and intact lateral masses throughout. Severe degenerative changes at the C1-C2 articulation.  IMPRESSION: 1. No acute intracranial abnormality. 2. Stable age related moderate to severe cortical atrophy and mild chronic microvascular ischemic changes of the white matter. 3. No cervical spine fractures identified. 4. Multilevel degenerative disc disease, spondylosis and facet degenerative changes throughout the cervical spine with borderline multifactorial spinal stenosis at C3-4. 5. Dental disease involving the posterior right maxillary teeth with periapical lucency.   Electronically Signed   By: Hulan Saas M.D.   On: 02/13/2014 19:35   Ct Cervical Spine Wo Contrast  02/13/2014   CLINICAL DATA:  80 year old who fell while at the nursing home earlier today.  EXAM: CT HEAD WITHOUT CONTRAST  CT CERVICAL SPINE WITHOUT CONTRAST   TECHNIQUE: Multidetector CT imaging of the head and cervical spine was performed following the standard protocol without intravenous contrast. Multiplanar CT image reconstructions of the cervical spine were also generated.  COMPARISON:  CT head and cervical spine 04/11/2012.  FINDINGS: CT HEAD FINDINGS  Moderate to severe age related cortical and deep atrophy, unchanged. Moderate changes of small vessel disease of the cerebral white matter, unchanged. No mass lesion. No midline shift. No acute hemorrhage or hematoma. No extra-axial fluid collections. No evidence of acute infarction. No significant interval change.  No skull fracture or other focal osseous abnormality involving the skull. Mucosal thickening involving the right maxillary sinus. Remaining visualized paranasal sinuses, bilateral mastoid air cells and bilateral middle ear cavities well aerated. Lucency involving the roots of the 3 most posterior right maxillary teeth; this area was not imaged on the prior examination.  CT CERVICAL SPINE FINDINGS  No fractures identified involving the cervical spine. Sagittal reconstructed images demonstrate anatomic alignment and exaggeration of the usual cervical lordosis. Facet joints intact throughout with severe degenerative changes and fusion of the left facets at C3 and C4. Borderline multifactorial spinal stenosis at C3-4. Chronic central disc protrusion at C3-4 with calcification in the posterior annular fibers. Vacuum disc phenomenon at C5-6. Coronal reformatted images demonstrate an intact craniocervical junction, intact dens, and intact lateral masses throughout. Severe degenerative changes at the C1-C2 articulation.  IMPRESSION: 1. No acute intracranial abnormality. 2. Stable age related moderate to severe cortical atrophy and mild chronic microvascular ischemic changes of the white matter. 3. No cervical spine fractures identified. 4. Multilevel degenerative disc disease, spondylosis and facet degenerative  changes throughout the cervical spine with borderline multifactorial spinal stenosis at C3-4. 5. Dental disease involving the posterior right maxillary teeth with periapical lucency.   Electronically Signed   By: Hulan Saas M.D.   On: 02/13/2014 19:35   Dg Humerus Right  02/13/2014  CLINICAL DATA:  Fall. Right upper arm pain. History of osteoporosis.  EXAM: RIGHT HUMERUS - 2+ VIEW  COMPARISON:  10/10/2006  FINDINGS: No fracture observed. The lateral projection raises the possibility of an elbow joint effusion although this is equivocal. If the patient has symptoms in the vicinity of the elbow, consider dedicated elbow 0 radiography.  IMPRESSION: 1. No acute bony findings. Questionable elbow joint effusion. Correlate with any symptoms in the vicinity of the elbow in determining whether dedicated elbow radiography is warranted.   Electronically Signed   By: Herbie Baltimore M.D.   On: 02/13/2014 20:28     EKG Interpretation None      MDM   Final diagnoses:  Fall  Compression fracture of L2, closed, initial encounter  UTI (lower urinary tract infection)   Pt in NAD. AAOx3. VSS. Afebrile. Alert and oriented 3. No focal neurologic deficits. Head CT and C-spine CT without any acute findings. Lumbar spine x-ray showing an acute L2 compression fracture. Right humerus x-ray showing questionable elbow joint effusion. Patient is refusing an elbow x-ray at this time, full range of motion without any pain. Urinary tract infection present. Rocephin started. Patient is unable to ambulate due to pain to her back. Pt will be admitted for unwitnessed fall and L2 compression fracture. Admission accepted by Dr. Conley Rolls, [facility].  Discussed with attending Dr. Gwendolyn Grant who also evaluated patient and agrees with plan of care.   Kathrynn Speed, PA-C 02/13/14 2219  Elwin Mocha, MD 02/14/14 [phone]

## 2014-02-13 NOTE — ED Notes (Signed)
Arrives via [facility] from [facility] [facility], fell this PM, initially refused transport, c/o low back and right arm pain, VSS, A/O X4,

## 2014-02-13 NOTE — ED Notes (Signed)
Ortho at bedside.

## 2014-02-13 NOTE — ED Notes (Signed)
Pt unable to urinate at this time.  Pt to CT

## 2014-02-13 NOTE — Progress Notes (Signed)
Orthopedic Tech Progress Note Patient Details:  Lu[facility] J [facility] [birth year] [id number] Applied lumbar corsett. Ortho Devices Type of Ortho Device: Lumbar corsett Ortho Device/Splint Interventions: Application   Lesle ChrisGilliland, Stevi Hollinshead L 02/13/2014, 9:43 PM

## 2014-02-13 NOTE — ED Notes (Signed)
Call ortho

## 2014-02-13 NOTE — H&P (Signed)
[facility] History and Physical  Christine Pierce NWG:[id number] DOB: [birth year]    PCP:   Kimber Relic, MD   Chief Complaint: falling and back pain.  HPI: Christine Pierce is an 80 y.o. female with hx of HTN, depression, obesity, Hyperlipidemia, chronic back pain, resident of assisted living, had a mechanical fall today and presented to the ER having lower back pain.  She denied LOC, CP, palpitation, or any other symptomology.  She was having lower back pain and said she was n't able to walk.  Evaluation in the ER included a x ray of her LS spine, showing a moderate L2 compressive Fx which was probably acute, along with a UA showing a UTI.  She was given pain medication, and hospitalist was asked to admit her as she didn't feel she was able to manage at the assisted living at this time.  Other work up included a head CT, cervical CT, CXR and left humerus film showing no acute processes.    Rewiew of Systems:  Constitutional: Negative for malaise, fever and chills. No significant weight loss or weight gain Eyes: Negative for eye pain, redness and discharge, diplopia, visual changes, or flashes of light. ENMT: Negative for ear pain, hoarseness, nasal congestion, sinus pressure and sore throat. No headaches; tinnitus, drooling, or problem swallowing. Cardiovascular: Negative for chest pain, palpitations, diaphoresis, dyspnea and peripheral edema. ; No orthopnea, PND Respiratory: Negative for cough, hemoptysis, wheezing and stridor. No pleuritic chestpain. Gastrointestinal: Negative for nausea, vomiting, diarrhea, constipation, abdominal pain, melena, blood in stool, hematemesis, jaundice and rectal bleeding.    Genitourinary: Negative for frequency, dysuria, incontinence,flank pain and hematuria; Musculoskeletal: Negative for neck pain. Negative for swelling and trauma.;  Skin: . Negative for pruritus, rash, abrasions, bruising and skin lesion.; ulcerations Neuro: Negative for headache,  lightheadedness and neck stiffness. Negative for weakness, altered level of consciousness , altered mental status, extremity weakness, burning feet, involuntary movement, seizure and syncope.  Psych: negative for anxiety, depression, insomnia, tearfulness, panic attacks, hallucinations, paranoia, suicidal or homicidal ideation    Past Medical History  Diagnosis Date  . Hypertension 10/23/2011  . Shortness of breath 04/30/2012  . Other nonspecific abnormal finding of lung field   . Contact with or exposure to unspecified communicable disease   . Cystocele, midline 04/10/2012  . Cough 04/10/2012  . Full incontinence of feces 04/10/2012  . Urgency of urination 04/10/2012  . Abdominal pain, other specified site   . Orthopnea 12/01/2011  . Type II or unspecified type diabetes mellitus without mention of complication, not stated as uncontrolled 11/16/2009  . Other and unspecified hyperlipidemia 10/20/2011  . Urinary tract infection, site not specified 09/11/2011  . Muscle weakness (generalized) 09/11/2011  . Syncope and collapse 03/29/2011  . Pain in joint, ankle and foot 12/27/2010  . Sprain of ribs 08/15/2010  . Unspecified tinnitus 07/04/2010  . Pneumonia, organism unspecified 05/04/2010  . Diverticulosis of colon (without mention of hemorrhage)   . Other and unspecified ovarian cyst 05/04/2010  . Abdominal pain, right lower quadrant 05/02/2010  . Anxiety state, unspecified 02/14/2010  . Shortness of breath 01/05/2010  . Nausea alone 12/13/2009  . Dehydration   . Pain in joint, shoulder region 11/15/2009  . Other abnormal blood chemistry   . Pain in joint, pelvic region and thigh 07/29/2009  . Personal history of fall 07/29/2009  . Other malaise and fatigue 02/15/2009  . Obesity, unspecified 07/16/2008  . Unspecified hereditary and idiopathic peripheral neuropathy   . Reflux  esophagitis 10/18/2007  . Unspecified vitamin D deficiency 07/19/2007  . Chest pain, unspecified  07/19/2007  . Varicose veins of lower extremities with inflammation 04/25/2006  . Restless legs syndrome (RLS) 01/16/2006  . Nocturia 07/07/2005  . Encounter for long-term (current) use of other medications   . Cervicalgia 01/20/2004  . Osteoporosis, unspecified 01/23/2003  . Sciatica 04/03/2001  . Routine general medical examination at a health care facility 11/15/2009  . Pain in joint, lower leg 12/11/2001  . Lumbago 02/20/2001  . Hard of hearing     both ears  . Renal disorder     last saw dr deterding 2 months ago, all new meds must be oked thru dr deterding  . Chronic kidney disease, stage III (moderate) 12/15/09  . Acute kidney failure, unspecified 12/10/2009  . Hydronephrosis 07/16/2008  . Cold   . Hypertension   . Diabetes     MANAGED WITH DIET AND EXERCISE  . Anxiety   . High cholesterol   . Depression     Past Surgical History  Procedure Laterality Date  . Appendectomy    . Stapedectomy Bilateral [phone]    middle ear surgery  . Eye surgery Left 1988    cataract w IOL implant, Dr. Vic Ripper  . Fracture surgery Right 09/2004    distal radius/Dr. Teressa Senter  . Tonsillectomy and adenoidectomy  age 84  . Esophagogastroduodenoscopy (egd) with propofol N/A 09/03/2013    Procedure: ESOPHAGOGASTRODUODENOSCOPY (EGD) WITH PROPOFOL;  Surgeon: Willis Modena, MD;  Location: [facility];  Service: Endoscopy;  Laterality: N/A;  . Botox injection N/A 09/03/2013    Procedure: BOTOX INJECTION;  Surgeon: Willis Modena, MD;  Location: [facility];  Service: Endoscopy;  Laterality: N/A;    Medications:  HOME MEDS: Prior to Admission medications   Medication Sig Start Date End Date Taking? Authorizing Provider  acetaminophen (TYLENOL) 500 MG tablet Take 500 mg by mouth every 4 (four) hours as needed (pain).    Yes Historical Provider, MD  albuterol (PROVENTIL HFA;VENTOLIN HFA) 108 (90 BASE) MCG/ACT inhaler Inhale 2 puffs into the lungs every 6 (six) hours as needed for wheezing.  12/31/12  Yes Kimber Relic, MD  ALPRAZolam Prudy Feeler) 0.5 MG tablet Take 1 tablet (0.5 mg total) by mouth 2 (two) times daily as needed for anxiety. 09/04/13  Yes Sharon Seller, NP  amphetamine-dextroamphetamine (ADDERALL) 5 MG tablet Take one tablet by mouth twice daily for ADHD Patient taking differently: Take 5 mg by mouth 2 (two) times daily.  02/11/14  Yes Roena Malady, PA-C  aspirin 81 MG chewable tablet Chew 81 mg by mouth daily.   Yes Historical Provider, MD  citalopram (CELEXA) 20 MG tablet Take 1 tablet (20 mg total) by mouth daily. Patient taking differently: Take 20 mg by mouth at bedtime.  10/21/13  Yes Kimber Relic, MD  dextromethorphan (DELSYM) 30 MG/5ML liquid 10 cc every 12 hours to suppress cough Patient taking differently: Take 60 mg by mouth every 12 (twelve) hours.  01/27/14  Yes Kimber Relic, MD  dextromethorphan-guaiFENesin [facility] DM) 30-600 MG per 12 hr tablet Take 1 tablet by mouth every 12 (twelve) hours as needed for cough (congestion).   Yes Historical Provider, MD  famotidine (PEPCID) 20 MG tablet One twice daily to suppress stomach acid Patient taking differently: Take 20 mg by mouth 2 (two) times daily.  01/27/14  Yes Kimber Relic, MD  hydrocortisone cream 1 % Apply to pruritic or irritated areas of legs twice daily as needed  Patient taking differently: Apply 1 application topically 2 (two) times daily as needed for itching. Apply to pruritic or irritated areas of legs twice daily as needed 07/30/13  Yes Kimber Relic, MD  lovastatin (MEVACOR) 40 MG tablet Take 40 mg by mouth every morning.   Yes Historical Provider, MD  Melatonin 3 MG CAPS Take 3 mg by mouth at bedtime as needed (Sleep).   Yes Historical Provider, MD  Multiple Vitamin (MULTIVITAMIN WITH MINERALS) TABS tablet Take 1 tablet by mouth daily.   Yes Historical Provider, MD  nitroGLYCERIN (NITROSTAT) 0.4 MG SL tablet Place 0.4 mg under the tongue every 5 (five) minutes as needed for chest pain.   Yes  Historical Provider, MD  omeprazole (PRILOSEC) 40 MG capsule Take 40 mg by mouth 2 (two) times daily.   Yes Historical Provider, MD  oxybutynin (DITROPAN-XL) 10 MG 24 hr tablet Take 10 mg by mouth at bedtime.   Yes Historical Provider, MD  senna-docusate (SENOKOT-S) 8.6-50 MG per tablet One nightly to prevent constipation Patient taking differently: Take 1 tablet by mouth at bedtime.  10/21/13  Yes Kimber Relic, MD  telmisartan-hydrochlorothiazide (MICARDIS HCT) 80-12.5 MG per tablet Take 1 tablet by mouth daily. 09/17/13  Yes Nyoka Cowden, MD  trolamine salicylate (ASPERCREME) 10 % cream Apply 1 application topically 3 (three) times daily. Right grater trochanteric bursa area   Yes Historical Provider, MD  amphetamine-dextroamphetamine (ADDERALL) 5 MG tablet Take one tablet by mouth twice daily for ADHD Patient not taking: Reported on 02/13/2014 02/11/14   Roena Malady, PA-C  Misc. Devices (ROLLER WALKER) MISC Rolling Walker with seat and brakes. 11/18/12   Kimber Relic, MD     Allergies:  Allergies  Allergen Reactions  . Macrodantin [Nitrofurantoin Macrocrystal]     Affects kidneys    Social History:   reports that she has never smoked. She has never used smokeless tobacco. She reports that she does not drink alcohol or use illicit drugs.  Family History: Family History  Problem Relation Age of Onset  . Cancer Mother     abdomen  . Cancer Father     brain, lung  . Heart disease Son      Physical Exam: Filed Vitals:   02/13/14 1830 02/13/14 2100 02/13/14 2115 02/13/14 2130  BP: 140/97 180/57 189/61 167/111  Pulse: 70 59 68 68  Temp:      TempSrc:      Resp:      Height:      Weight:      SpO2: 99% 96% 96% 100%   Blood pressure 167/111, pulse 68, temperature 98.7 F (37.1 C), temperature source Oral, resp. rate 18, height 5\' 7"  (1.702 m), weight 72.576 kg (160 lb), SpO2 100 %.  GEN:  Pleasant  patient lying in the stretcher in no acute distress; cooperative with  exam. PSYCH:  alert and oriented x4; does not appear anxious or depressed; affect is appropriate. HEENT: Mucous membranes pink and anicteric; PERRLA; EOM intact; no cervical lymphadenopathy nor thyromegaly or carotid bruit; no JVD; There were no stridor. Neck is very supple. Breasts:: Not examined CHEST WALL: No tenderness CHEST: Normal respiration, clear to auscultation bilaterally.  HEART: Regular rate and rhythm.  There are no murmur, rub, or gallops.   BACK: No kyphosis or scoliosis; no CVA tenderness ABDOMEN: soft and non-tender; no masses, no organomegaly, normal abdominal bowel sounds; no pannus; no intertriginous candida. There is no rebound and no distention. Rectal Exam: Not done  EXTREMITIES: No bone or joint deformity; age-appropriate arthropathy of the hands and knees; no edema; no ulcerations.  There is no calf tenderness. Genitalia: not examined PULSES: 2+ and symmetric SKIN: Normal hydration no rash or ulceration CNS: Cranial nerves 2-12 grossly intact no focal lateralizing neurologic deficit.  Speech is fluent; uvula elevated with phonation, facial symmetry and tongue midline. DTR are normal bilaterally, cerebella exam is intact, barbinski is negative and strengths are equaled bilaterally.  No sensory loss.   Labs on Admission:  Basic Metabolic Panel:  Recent Labs Lab 02/13/14 1805  NA 140  K 4.8  CL 104  CO2 23  GLUCOSE 94  BUN 33*  CREATININE 1.34*  CALCIUM 10.1   Liver Function Tests: No results for input(s): AST, ALT, ALKPHOS, BILITOT, PROT, ALBUMIN in the last 168 hours. No results for input(s): LIPASE, AMYLASE in the last 168 hours. No results for input(s): AMMONIA in the last 168 hours. CBC:  Recent Labs Lab 02/13/14 1805  WBC 10.7*  HGB 11.6*  HCT 35.0*  MCV 91.9  PLT 174   Cardiac Enzymes: No results for input(s): CKTOTAL, CKMB, CKMBINDEX, TROPONINI in the last 168 hours.  CBG: No results for input(s): GLUCAP in the last 168  hours.   Radiological Exams on Admission: Dg Chest 2 View  02/13/2014   CLINICAL DATA:  80 year old female status post fall  EXAM: CHEST  2 VIEW  COMPARISON:  Prior chest x-ray 09/17/2013  FINDINGS: Stable cardiac and mediastinal contours. Atherosclerotic calcifications present in the aorta. No evidence of a pneumothorax or pleural effusion. Stable central bronchitic change of a mild severity. And upper lumbar compression fracture is incompletely imaged. This represents a new finding compared to 09/10/2012. However, the appearance is suggestive of a chronic injury.  IMPRESSION: 1. No active cardiopulmonary disease. 2. Incompletely imaged upper lumbar compression fracture is an interval finding compared to the prior chest CT from 09/10/2012. However, the appearance suggests a remote injury. 3. Aortic atherosclerosis.   Electronically Signed   By: Malachy MoanHeath  McCullough M.D.   On: 02/13/2014 20:29   Dg Lumbar Spine Complete  02/13/2014   CLINICAL DATA:  Fall with low back pain.  EXAM: LUMBAR SPINE - COMPLETE 4+ VIEW  COMPARISON:  05/02/2010 and 09/22/2008 as well as CT 05/02/2010  FINDINGS: Exam demonstrates diffuse decreased bone mineralization. Exam demonstrates mild spondylosis of the lumbar spine to include facet arthropathy. There is a moderate compression fracture of L2 new since 2012 and likely an acute injury. There is prominent deformity of the anterior superior aspect of the vertebral body. No retropulsion component. There is mild grade 1 anterolisthesis of L4 on L5 unchanged. Disc space narrowing at the L5-S1 level unchanged. T11-12 calcified disc unchanged. Subtle lucency along the superior border of the left sacral canal as cannot completely exclude a fracture in this location.  IMPRESSION: Moderate L2 compression fracture likely acute.  Subtle lucency over the superior border of the left sacral L eft as cannot exclude a fracture.  Mild spondylosis of the lumbar spine with disc disease at the L5-S1  level.  Stable grade 1 anterolisthesis of L4 on L5.   Electronically Signed   By: Elberta Fortisaniel  Boyle M.D.   On: 02/13/2014 20:30   Ct Head Wo Contrast  02/13/2014   CLINICAL DATA:  80 year old who fell while at the nursing home earlier today.  EXAM: CT HEAD WITHOUT CONTRAST  CT CERVICAL SPINE WITHOUT CONTRAST  TECHNIQUE: Multidetector CT imaging of the head and cervical spine was performed  following the standard protocol without intravenous contrast. Multiplanar CT image reconstructions of the cervical spine were also generated.  COMPARISON:  CT head and cervical spine 04/11/2012.  FINDINGS: CT HEAD FINDINGS  Moderate to severe age related cortical and deep atrophy, unchanged. Moderate changes of small vessel disease of the cerebral white matter, unchanged. No mass lesion. No midline shift. No acute hemorrhage or hematoma. No extra-axial fluid collections. No evidence of acute infarction. No significant interval change.  No skull fracture or other focal osseous abnormality involving the skull. Mucosal thickening involving the right maxillary sinus. Remaining visualized paranasal sinuses, bilateral mastoid air cells and bilateral middle ear cavities well aerated. Lucency involving the roots of the 3 most posterior right maxillary teeth; this area was not imaged on the prior examination.  CT CERVICAL SPINE FINDINGS  No fractures identified involving the cervical spine. Sagittal reconstructed images demonstrate anatomic alignment and exaggeration of the usual cervical lordosis. Facet joints intact throughout with severe degenerative changes and fusion of the left facets at C3 and C4. Borderline multifactorial spinal stenosis at C3-4. Chronic central disc protrusion at C3-4 with calcification in the posterior annular fibers. Vacuum disc phenomenon at C5-6. Coronal reformatted images demonstrate an intact craniocervical junction, intact dens, and intact lateral masses throughout. Severe degenerative changes at the C1-C2  articulation.  IMPRESSION: 1. No acute intracranial abnormality. 2. Stable age related moderate to severe cortical atrophy and mild chronic microvascular ischemic changes of the white matter. 3. No cervical spine fractures identified. 4. Multilevel degenerative disc disease, spondylosis and facet degenerative changes throughout the cervical spine with borderline multifactorial spinal stenosis at C3-4. 5. Dental disease involving the posterior right maxillary teeth with periapical lucency.   Electronically Signed   By: Hulan Saashomas  Lawrence M.D.   On: 02/13/2014 19:35   Ct Cervical Spine Wo Contrast  02/13/2014   CLINICAL DATA:  80 year old who fell while at the nursing home earlier today.  EXAM: CT HEAD WITHOUT CONTRAST  CT CERVICAL SPINE WITHOUT CONTRAST  TECHNIQUE: Multidetector CT imaging of the head and cervical spine was performed following the standard protocol without intravenous contrast. Multiplanar CT image reconstructions of the cervical spine were also generated.  COMPARISON:  CT head and cervical spine 04/11/2012.  FINDINGS: CT HEAD FINDINGS  Moderate to severe age related cortical and deep atrophy, unchanged. Moderate changes of small vessel disease of the cerebral white matter, unchanged. No mass lesion. No midline shift. No acute hemorrhage or hematoma. No extra-axial fluid collections. No evidence of acute infarction. No significant interval change.  No skull fracture or other focal osseous abnormality involving the skull. Mucosal thickening involving the right maxillary sinus. Remaining visualized paranasal sinuses, bilateral mastoid air cells and bilateral middle ear cavities well aerated. Lucency involving the roots of the 3 most posterior right maxillary teeth; this area was not imaged on the prior examination.  CT CERVICAL SPINE FINDINGS  No fractures identified involving the cervical spine. Sagittal reconstructed images demonstrate anatomic alignment and exaggeration of the usual cervical  lordosis. Facet joints intact throughout with severe degenerative changes and fusion of the left facets at C3 and C4. Borderline multifactorial spinal stenosis at C3-4. Chronic central disc protrusion at C3-4 with calcification in the posterior annular fibers. Vacuum disc phenomenon at C5-6. Coronal reformatted images demonstrate an intact craniocervical junction, intact dens, and intact lateral masses throughout. Severe degenerative changes at the C1-C2 articulation.  IMPRESSION: 1. No acute intracranial abnormality. 2. Stable age related moderate to severe cortical atrophy and mild chronic microvascular  ischemic changes of the white matter. 3. No cervical spine fractures identified. 4. Multilevel degenerative disc disease, spondylosis and facet degenerative changes throughout the cervical spine with borderline multifactorial spinal stenosis at C3-4. 5. Dental disease involving the posterior right maxillary teeth with periapical lucency.   Electronically Signed   By: Hulan Saas M.D.   On: 02/13/2014 19:35   Dg Humerus Right  02/13/2014   CLINICAL DATA:  Fall. Right upper arm pain. History of osteoporosis.  EXAM: RIGHT HUMERUS - 2+ VIEW  COMPARISON:  10/10/2006  FINDINGS: No fracture observed. The lateral projection raises the possibility of an elbow joint effusion although this is equivocal. If the patient has symptoms in the vicinity of the elbow, consider dedicated elbow 0 radiography.  IMPRESSION: 1. No acute bony findings. Questionable elbow joint effusion. Correlate with any symptoms in the vicinity of the elbow in determining whether dedicated elbow radiography is warranted.   Electronically Signed   By: Herbie Baltimore M.D.   On: 02/13/2014 20:28   Assessment/Plan Present on Admission:  . Lumbar compression fracture . CKD (chronic kidney disease) stage 2, GFR 60-89 ml/min . UTI (urinary tract infection) . Anxiety state  PLAN:  Will admit her OBS for pain control since her L2 compressive  Fx was felt to be acute.  Will obtain PT for evaluation.  She would like to return to her assisted living rather than rehab if it can be done safely.  For her UTI, will continue with IV Rocephin given in the ER.  Her other home meds will be continued.  She is stable, full code, and will be admitted to hospitalist service. Thank you for allowing me to participate in her care.   Other plans as per orders.  Code Status: FULL Unk Lightning, MD. [facility] Pager [phone] 7pm to 7am.  02/13/2014, 10:50 PM

## 2014-02-14 DIAGNOSIS — M25552 Pain in left hip: Secondary | ICD-10-CM | POA: Diagnosis not present

## 2014-02-14 DIAGNOSIS — N390 Urinary tract infection, site not specified: Secondary | ICD-10-CM | POA: Diagnosis not present

## 2014-02-14 DIAGNOSIS — Z881 Allergy status to other antibiotic agents status: Secondary | ICD-10-CM | POA: Diagnosis not present

## 2014-02-14 DIAGNOSIS — Y92129 Unspecified place in nursing home as the place of occurrence of the external cause: Secondary | ICD-10-CM | POA: Diagnosis not present

## 2014-02-14 DIAGNOSIS — J449 Chronic obstructive pulmonary disease, unspecified: Secondary | ICD-10-CM | POA: Diagnosis present

## 2014-02-14 DIAGNOSIS — E785 Hyperlipidemia, unspecified: Secondary | ICD-10-CM | POA: Diagnosis present

## 2014-02-14 DIAGNOSIS — E1121 Type 2 diabetes mellitus with diabetic nephropathy: Secondary | ICD-10-CM | POA: Diagnosis not present

## 2014-02-14 DIAGNOSIS — F329 Major depressive disorder, single episode, unspecified: Secondary | ICD-10-CM | POA: Diagnosis present

## 2014-02-14 DIAGNOSIS — K5730 Diverticulosis of large intestine without perforation or abscess without bleeding: Secondary | ICD-10-CM | POA: Diagnosis present

## 2014-02-14 DIAGNOSIS — K089 Disorder of teeth and supporting structures, unspecified: Secondary | ICD-10-CM | POA: Diagnosis present

## 2014-02-14 DIAGNOSIS — E1129 Type 2 diabetes mellitus with other diabetic kidney complication: Secondary | ICD-10-CM | POA: Diagnosis not present

## 2014-02-14 DIAGNOSIS — N182 Chronic kidney disease, stage 2 (mild): Secondary | ICD-10-CM | POA: Diagnosis not present

## 2014-02-14 DIAGNOSIS — E1122 Type 2 diabetes mellitus with diabetic chronic kidney disease: Secondary | ICD-10-CM | POA: Diagnosis present

## 2014-02-14 DIAGNOSIS — R278 Other lack of coordination: Secondary | ICD-10-CM | POA: Diagnosis not present

## 2014-02-14 DIAGNOSIS — S32020A Wedge compression fracture of second lumbar vertebra, initial encounter for closed fracture: Secondary | ICD-10-CM | POA: Diagnosis not present

## 2014-02-14 DIAGNOSIS — S32020D Wedge compression fracture of second lumbar vertebra, subsequent encounter for fracture with routine healing: Secondary | ICD-10-CM | POA: Diagnosis not present

## 2014-02-14 DIAGNOSIS — F419 Anxiety disorder, unspecified: Secondary | ICD-10-CM | POA: Diagnosis present

## 2014-02-14 DIAGNOSIS — Z9889 Other specified postprocedural states: Secondary | ICD-10-CM | POA: Diagnosis not present

## 2014-02-14 DIAGNOSIS — Z79899 Other long term (current) drug therapy: Secondary | ICD-10-CM | POA: Diagnosis not present

## 2014-02-14 DIAGNOSIS — G2581 Restless legs syndrome: Secondary | ICD-10-CM | POA: Diagnosis present

## 2014-02-14 DIAGNOSIS — M4806 Spinal stenosis, lumbar region: Secondary | ICD-10-CM | POA: Diagnosis present

## 2014-02-14 DIAGNOSIS — E559 Vitamin D deficiency, unspecified: Secondary | ICD-10-CM | POA: Diagnosis present

## 2014-02-14 DIAGNOSIS — K5900 Constipation, unspecified: Secondary | ICD-10-CM | POA: Diagnosis not present

## 2014-02-14 DIAGNOSIS — M549 Dorsalgia, unspecified: Secondary | ICD-10-CM | POA: Diagnosis not present

## 2014-02-14 DIAGNOSIS — E780 Pure hypercholesterolemia: Secondary | ICD-10-CM | POA: Diagnosis present

## 2014-02-14 DIAGNOSIS — M4856XA Collapsed vertebra, not elsewhere classified, lumbar region, initial encounter for fracture: Secondary | ICD-10-CM | POA: Diagnosis present

## 2014-02-14 DIAGNOSIS — S329XXA Fracture of unspecified parts of lumbosacral spine and pelvis, initial encounter for closed fracture: Secondary | ICD-10-CM | POA: Diagnosis not present

## 2014-02-14 DIAGNOSIS — H9193 Unspecified hearing loss, bilateral: Secondary | ICD-10-CM | POA: Diagnosis present

## 2014-02-14 DIAGNOSIS — E669 Obesity, unspecified: Secondary | ICD-10-CM | POA: Diagnosis present

## 2014-02-14 DIAGNOSIS — Z7982 Long term (current) use of aspirin: Secondary | ICD-10-CM | POA: Diagnosis not present

## 2014-02-14 DIAGNOSIS — R260 Ataxic gait: Secondary | ICD-10-CM | POA: Diagnosis not present

## 2014-02-14 DIAGNOSIS — G8929 Other chronic pain: Secondary | ICD-10-CM | POA: Diagnosis not present

## 2014-02-14 DIAGNOSIS — Z6828 Body mass index (BMI) 28.0-28.9, adult: Secondary | ICD-10-CM | POA: Diagnosis not present

## 2014-02-14 DIAGNOSIS — S32020S Wedge compression fracture of second lumbar vertebra, sequela: Secondary | ICD-10-CM | POA: Diagnosis not present

## 2014-02-14 DIAGNOSIS — F411 Generalized anxiety disorder: Secondary | ICD-10-CM | POA: Diagnosis not present

## 2014-02-14 DIAGNOSIS — I129 Hypertensive chronic kidney disease with stage 1 through stage 4 chronic kidney disease, or unspecified chronic kidney disease: Secondary | ICD-10-CM | POA: Diagnosis present

## 2014-02-14 DIAGNOSIS — M810 Age-related osteoporosis without current pathological fracture: Secondary | ICD-10-CM | POA: Diagnosis not present

## 2014-02-14 DIAGNOSIS — B9620 Unspecified Escherichia coli [E. coli] as the cause of diseases classified elsewhere: Secondary | ICD-10-CM | POA: Diagnosis present

## 2014-02-14 DIAGNOSIS — W19XXXA Unspecified fall, initial encounter: Secondary | ICD-10-CM | POA: Diagnosis present

## 2014-02-14 DIAGNOSIS — N179 Acute kidney failure, unspecified: Secondary | ICD-10-CM | POA: Diagnosis not present

## 2014-02-14 DIAGNOSIS — S79912A Unspecified injury of left hip, initial encounter: Secondary | ICD-10-CM | POA: Diagnosis not present

## 2014-02-14 DIAGNOSIS — S32000S Wedge compression fracture of unspecified lumbar vertebra, sequela: Secondary | ICD-10-CM

## 2014-02-14 DIAGNOSIS — M6281 Muscle weakness (generalized): Secondary | ICD-10-CM | POA: Diagnosis not present

## 2014-02-14 DIAGNOSIS — R279 Unspecified lack of coordination: Secondary | ICD-10-CM | POA: Diagnosis not present

## 2014-02-14 DIAGNOSIS — S3992XA Unspecified injury of lower back, initial encounter: Secondary | ICD-10-CM | POA: Diagnosis not present

## 2014-02-14 MED ORDER — TRAMADOL HCL 50 MG PO TABS
50.0000 mg | ORAL_TABLET | Freq: Four times a day (QID) | ORAL | Status: DC | PRN
  Administered 2014-02-14 – 2014-02-15 (×3): 100 mg via ORAL
  Filled 2014-02-14 (×3): qty 2

## 2014-02-14 MED ORDER — HYDROCHLOROTHIAZIDE 12.5 MG PO CAPS
12.5000 mg | ORAL_CAPSULE | Freq: Every day | ORAL | Status: DC
  Administered 2014-02-14 – 2014-02-21 (×8): 12.5 mg via ORAL
  Filled 2014-02-14 (×8): qty 1

## 2014-02-14 MED ORDER — IRBESARTAN 300 MG PO TABS
300.0000 mg | ORAL_TABLET | Freq: Every day | ORAL | Status: DC
  Administered 2014-02-14 – 2014-02-21 (×8): 300 mg via ORAL
  Filled 2014-02-14 (×8): qty 1

## 2014-02-14 NOTE — Progress Notes (Signed)
Physical Therapy Evaluation Patient Details Name: Christine Pierce MRN: [id number] DOB: Sep 08, 1921 Today's Date: 02/14/2014   History of Present Illness  80 y.o. female, resident of assisted living, with mechanical fall. X ray of her LS spine, shows moderate L2 compressive Fx, along with a UA showing a UTI.  Clinical Impression  Patient seen with POA, Clarisse, present as well.  Patient very antalgic, nursing working to moderate pain.  Provided spine precaution handout and instructed patient and caregiver in don/doff technique for lumbar corset. Moderate assistance for bed mobility with log roll technique, increased cueing required.  Minimal assist for sit to stand, sometimes requiring multiple attempts, due to pain.  Once standing, patient is [employer] assist, for short distance ambulation.  Patient is appropriate for continued skilled PT services while in hospital.  Once pain is controlled better, patient may be appropriate for direct return home, or may require SNF for short duration.  Will continue assessment.     Follow Up Recommendations Home health PT;SNF;Supervision/Assistance - 24 hour    Equipment Recommendations  None recommended by PT    Recommendations for Other Services OT consult     Precautions / Restrictions Precautions Precautions: Back Precaution Booklet Issued: Yes (comment) Required Braces or Orthoses: Spinal Brace Spinal Brace: Lumbar corset Restrictions Weight Bearing Restrictions: No      Mobility  Bed Mobility Overal bed mobility: Needs Assistance Bed Mobility: Rolling;Sidelying to Sit Rolling: Min assist Sidelying to sit: Mod assist       General bed mobility comments: Needs physical assist and cues for log-roll technique  Transfers Overall transfer level: Needs assistance Equipment used: Rolling walker (2 wheeled) Transfers: Sit to/from Stand Sit to Stand: Min assist         General transfer comment: Limited by antalgia, needed multiple  attempts at times.  Ambulation/Gait Ambulation/Gait assistance: Min guard Ambulation Distance (Feet): 75 Feet Assistive device: Rolling walker (2 wheeled) Gait Pattern/deviations: Step-through pattern;Antalgic;Trunk flexed Gait velocity: slow Gait velocity interpretation: Below normal speed for age/gender    Stairs            Wheelchair Mobility    Modified Rankin (Stroke Patients Only)       Balance Overall balance assessment: Needs assistance Sitting-balance support: Single extremity supported Sitting balance-Leahy Scale: Fair     Standing balance support: Bilateral upper extremity supported Standing balance-Leahy Scale: Fair                               Pertinent Vitals/Pain Pain Assessment: 0-10 Pain Score: 8  Pain Location: Low back Pain Descriptors / Indicators: Aching;Sharp    Home Living Family/patient expects to be discharged to:: Assisted living Living Arrangements:  (Staff at ALF)             Home Equipment: Dan HumphreysWalker - 4 wheels;Cane - single point Additional Comments: Uses cane in room, and walker for community distances.    Prior Function Level of Independence: Independent with assistive device(s)         Comments: Uses cane in room, and walker for community distances.     Hand Dominance        Extremity/Trunk Assessment   Upper Extremity Assessment: Overall WFL for tasks assessed           Lower Extremity Assessment: Generalized weakness (due to pain)      Cervical / Trunk Assessment: Normal  Communication   Communication: No difficulties  Cognition Arousal/Alertness: Awake/alert Behavior  During Therapy: WFL for tasks assessed/performed Overall Cognitive Status: Within Functional Limits for tasks assessed                      General Comments      Exercises        Assessment/Plan    PT Assessment Patient needs continued PT services  PT Diagnosis Difficulty walking;Acute pain   PT  Problem List Decreased activity tolerance;Decreased mobility;Pain;Decreased knowledge of precautions  PT Treatment Interventions Gait training;Functional mobility training;Therapeutic activities;Patient/family education   PT Goals (Current goals can be found in the Care Plan section) Acute Rehab PT Goals Patient Stated Goal: To get back to ALF PT Goal Formulation: With patient/family Time For Goal Achievement: 02/28/14 Potential to Achieve Goals: Good    Frequency Min 4X/week   Barriers to discharge Decreased caregiver support Needs to be mostly independent for ALF    Co-evaluation               End of Session Equipment Utilized During Treatment: Gait belt;Back brace Activity Tolerance: Patient limited by pain Patient left: in bed;with call bell/phone within reach;with family/visitor present Nurse Communication: Mobility status;Patient requests pain meds         Time: 1225-1310 PT Time Calculation (min) (ACUTE ONLY): 45 min   Charges:   PT Evaluation $Initial PT Evaluation Tier I: 1 Procedure PT Treatments $Gait Training: 8-22 mins $Therapeutic Activity: 8-22 mins   PT G Codes:          Inayah Woodin L 02/14/2014, 2:35 PM

## 2014-02-14 NOTE — Progress Notes (Addendum)
[facility] PROGRESS NOTE  Christine Pierce ZOX:[id number] DOB: 09/28/1921 DOA: 02/13/2014 PCP: Kimber RelicGREEN, ARTHUR G, MD   80 year old female with history of hypertension, depression, obesity, hyperlipidemia, chronic back pain who presented after a fall and was found in the ER to have moderate L2 compression fracture  Assessment/Plan: 1. L2 compression fracture: new hospital problem - PT evaluation - pain control  2. UTI: new hospital problem - continue rocephin - awaiting urine culture  3. Copd  - stable, continue bronchodilators   Code Status: full Family Communication: Discussed with patient and daughter  Disposition Plan: Discharge planning pending PT recommendations   Consultants:  Physical therapy  Procedures:  None  Antibiotics:  None   HPI/Subjective: Pt states that her pain is not that well controlled.  Objective: Filed Vitals:   02/14/14 0506  BP: 142/60  Pulse: 68  Temp: 97.5 F (36.4 C)  Resp: 18    Intake/Output Summary (Last 24 hours) at 02/14/14 1552 Last data filed at 02/14/14 0800  Gross per 24 hour  Intake    120 ml  Output      0 ml  Net    120 ml   Filed Weights   02/13/14 1726 02/13/14 2359  Weight: 72.576 kg (160 lb) 77.021 kg (169 lb 12.8 oz)    Exam:   General:  Pt in nad, alert and awake  Cardiovascular: rrr, no mrg  Respiratory: cta bl, no wheezes  Abdomen: soft, NT, nd  Musculoskeletal: no cyanosis or clubbing  Data Reviewed: Basic Metabolic Panel:  Recent Labs Lab 02/13/14 1805  NA 140  K 4.8  CL 104  CO2 23  GLUCOSE 94  BUN 33*  CREATININE 1.34*  CALCIUM 10.1   Liver Function Tests: No results for input(s): AST, ALT, ALKPHOS, BILITOT, PROT, ALBUMIN in the last 168 hours. No results for input(s): LIPASE, AMYLASE in the last 168 hours. No results for input(s): AMMONIA in the last 168 hours. CBC:  Recent Labs Lab 02/13/14 1805  WBC 10.7*  HGB 11.6*  HCT 35.0*  MCV 91.9  PLT 174   Cardiac  Enzymes: No results for input(s): CKTOTAL, CKMB, CKMBINDEX, TROPONINI in the last 168 hours. BNP (last 3 results)  Recent Labs  12/01/13 1652  PROBNP 187.0*   CBG: No results for input(s): GLUCAP in the last 168 hours.  No results found for this or any previous visit (from the past 240 hour(s)).   Studies: Dg Chest 2 View  02/13/2014   CLINICAL DATA:  80 year old female status post fall  EXAM: CHEST  2 VIEW  COMPARISON:  Prior chest x-ray 09/17/2013  FINDINGS: Stable cardiac and mediastinal contours. Atherosclerotic calcifications present in the aorta. No evidence of a pneumothorax or pleural effusion. Stable central bronchitic change of a mild severity. And upper lumbar compression fracture is incompletely imaged. This represents a new finding compared to 09/10/2012. However, the appearance is suggestive of a chronic injury.  IMPRESSION: 1. No active cardiopulmonary disease. 2. Incompletely imaged upper lumbar compression fracture is an interval finding compared to the prior chest CT from 09/10/2012. However, the appearance suggests a remote injury. 3. Aortic atherosclerosis.   Electronically Signed   By: Malachy MoanHeath  McCullough M.D.   On: 02/13/2014 20:29   Dg Lumbar Spine Complete  02/13/2014   CLINICAL DATA:  Fall with low back pain.  EXAM: LUMBAR SPINE - COMPLETE 4+ VIEW  COMPARISON:  05/02/2010 and 09/22/2008 as well as CT 05/02/2010  FINDINGS: Exam demonstrates diffuse decreased bone mineralization. Exam  demonstrates mild spondylosis of the lumbar spine to include facet arthropathy. There is a moderate compression fracture of L2 new since 2012 and likely an acute injury. There is prominent deformity of the anterior superior aspect of the vertebral body. No retropulsion component. There is mild grade 1 anterolisthesis of L4 on L5 unchanged. Disc space narrowing at the L5-S1 level unchanged. T11-12 calcified disc unchanged. Subtle lucency along the superior border of the left sacral canal as  cannot completely exclude a fracture in this location.  IMPRESSION: Moderate L2 compression fracture likely acute.  Subtle lucency over the superior border of the left sacral L eft as cannot exclude a fracture.  Mild spondylosis of the lumbar spine with disc disease at the L5-S1 level.  Stable grade 1 anterolisthesis of L4 on L5.   Electronically Signed   By: Elberta Fortisaniel  Boyle M.D.   On: 02/13/2014 20:30   Ct Head Wo Contrast  02/13/2014   CLINICAL DATA:  80 year old who fell while at the nursing home earlier today.  EXAM: CT HEAD WITHOUT CONTRAST  CT CERVICAL SPINE WITHOUT CONTRAST  TECHNIQUE: Multidetector CT imaging of the head and cervical spine was performed following the standard protocol without intravenous contrast. Multiplanar CT image reconstructions of the cervical spine were also generated.  COMPARISON:  CT head and cervical spine 04/11/2012.  FINDINGS: CT HEAD FINDINGS  Moderate to severe age related cortical and deep atrophy, unchanged. Moderate changes of small vessel disease of the cerebral white matter, unchanged. No mass lesion. No midline shift. No acute hemorrhage or hematoma. No extra-axial fluid collections. No evidence of acute infarction. No significant interval change.  No skull fracture or other focal osseous abnormality involving the skull. Mucosal thickening involving the right maxillary sinus. Remaining visualized paranasal sinuses, bilateral mastoid air cells and bilateral middle ear cavities well aerated. Lucency involving the roots of the 3 most posterior right maxillary teeth; this area was not imaged on the prior examination.  CT CERVICAL SPINE FINDINGS  No fractures identified involving the cervical spine. Sagittal reconstructed images demonstrate anatomic alignment and exaggeration of the usual cervical lordosis. Facet joints intact throughout with severe degenerative changes and fusion of the left facets at C3 and C4. Borderline multifactorial spinal stenosis at C3-4. Chronic  central disc protrusion at C3-4 with calcification in the posterior annular fibers. Vacuum disc phenomenon at C5-6. Coronal reformatted images demonstrate an intact craniocervical junction, intact dens, and intact lateral masses throughout. Severe degenerative changes at the C1-C2 articulation.  IMPRESSION: 1. No acute intracranial abnormality. 2. Stable age related moderate to severe cortical atrophy and mild chronic microvascular ischemic changes of the white matter. 3. No cervical spine fractures identified. 4. Multilevel degenerative disc disease, spondylosis and facet degenerative changes throughout the cervical spine with borderline multifactorial spinal stenosis at C3-4. 5. Dental disease involving the posterior right maxillary teeth with periapical lucency.   Electronically Signed   By: Hulan Saashomas  Lawrence M.D.   On: 02/13/2014 19:35   Ct Cervical Spine Wo Contrast  02/13/2014   CLINICAL DATA:  80 year old who fell while at the nursing home earlier today.  EXAM: CT HEAD WITHOUT CONTRAST  CT CERVICAL SPINE WITHOUT CONTRAST  TECHNIQUE: Multidetector CT imaging of the head and cervical spine was performed following the standard protocol without intravenous contrast. Multiplanar CT image reconstructions of the cervical spine were also generated.  COMPARISON:  CT head and cervical spine 04/11/2012.  FINDINGS: CT HEAD FINDINGS  Moderate to severe age related cortical and deep atrophy, unchanged. Moderate changes  of small vessel disease of the cerebral white matter, unchanged. No mass lesion. No midline shift. No acute hemorrhage or hematoma. No extra-axial fluid collections. No evidence of acute infarction. No significant interval change.  No skull fracture or other focal osseous abnormality involving the skull. Mucosal thickening involving the right maxillary sinus. Remaining visualized paranasal sinuses, bilateral mastoid air cells and bilateral middle ear cavities well aerated. Lucency involving the roots of  the 3 most posterior right maxillary teeth; this area was not imaged on the prior examination.  CT CERVICAL SPINE FINDINGS  No fractures identified involving the cervical spine. Sagittal reconstructed images demonstrate anatomic alignment and exaggeration of the usual cervical lordosis. Facet joints intact throughout with severe degenerative changes and fusion of the left facets at C3 and C4. Borderline multifactorial spinal stenosis at C3-4. Chronic central disc protrusion at C3-4 with calcification in the posterior annular fibers. Vacuum disc phenomenon at C5-6. Coronal reformatted images demonstrate an intact craniocervical junction, intact dens, and intact lateral masses throughout. Severe degenerative changes at the C1-C2 articulation.  IMPRESSION: 1. No acute intracranial abnormality. 2. Stable age related moderate to severe cortical atrophy and mild chronic microvascular ischemic changes of the white matter. 3. No cervical spine fractures identified. 4. Multilevel degenerative disc disease, spondylosis and facet degenerative changes throughout the cervical spine with borderline multifactorial spinal stenosis at C3-4. 5. Dental disease involving the posterior right maxillary teeth with periapical lucency.   Electronically Signed   By: Hulan Saas M.D.   On: 02/13/2014 19:35   Dg Humerus Right  02/13/2014   CLINICAL DATA:  Fall. Right upper arm pain. History of osteoporosis.  EXAM: RIGHT HUMERUS - 2+ VIEW  COMPARISON:  10/10/2006  FINDINGS: No fracture observed. The lateral projection raises the possibility of an elbow joint effusion although this is equivocal. If the patient has symptoms in the vicinity of the elbow, consider dedicated elbow 0 radiography.  IMPRESSION: 1. No acute bony findings. Questionable elbow joint effusion. Correlate with any symptoms in the vicinity of the elbow in determining whether dedicated elbow radiography is warranted.   Electronically Signed   By: Herbie Baltimore M.D.    On: 02/13/2014 20:28    Scheduled Meds: . amphetamine-dextroamphetamine  5 mg Oral BID  . aspirin  81 mg Oral Daily  . cefTRIAXone (ROCEPHIN)  IV  1 g Intravenous Q24H  . citalopram  20 mg Oral QHS  . famotidine  20 mg Oral BID  . heparin  5,000 Units Subcutaneous 3 times per day  . irbesartan  300 mg Oral Daily   And  . hydrochlorothiazide  12.5 mg Oral Daily  . multivitamin with minerals  1 tablet Oral Daily  . oxybutynin  10 mg Oral QHS  . pravastatin  40 mg Oral q1800  . senna-docusate  1 tablet Oral QHS   Continuous Infusions:   Principal Problem:   Lumbar compression fracture Active Problems:   UTI (urinary tract infection)   Anxiety state   CKD (chronic kidney disease) stage 2, GFR 60-89 ml/min   Compression fracture    Time spent: > 35 minutes    Penny Pia  [facility] Pager [phone] If 7PM-7AM, please contact night-coverage at [email or web address], password [facility] 02/14/2014, 3:52 PM  LOS: 1 day

## 2014-02-14 NOTE — Progress Notes (Signed)
Pt arrived to [id number] via stretcher. Pt ambulated to bed. VSS. Oriented to room and equipment. Will continue to monitor.

## 2014-02-14 NOTE — Progress Notes (Signed)
UR completed 

## 2014-02-15 ENCOUNTER — Inpatient Hospital Stay (HOSPITAL_COMMUNITY): Payer: Medicare Other

## 2014-02-15 MED ORDER — OXYCODONE HCL 5 MG PO TABS
5.0000 mg | ORAL_TABLET | ORAL | Status: DC | PRN
  Administered 2014-02-16: 5 mg via ORAL
  Filled 2014-02-15: qty 1

## 2014-02-15 MED ORDER — HYDROCODONE-ACETAMINOPHEN 7.5-325 MG PO TABS
1.0000 | ORAL_TABLET | Freq: Four times a day (QID) | ORAL | Status: DC | PRN
  Administered 2014-02-15: 1 via ORAL
  Filled 2014-02-15: qty 1

## 2014-02-15 MED ORDER — OXYCODONE-ACETAMINOPHEN 5-325 MG PO TABS
1.0000 | ORAL_TABLET | Freq: Four times a day (QID) | ORAL | Status: DC | PRN
  Administered 2014-02-15: 1 via ORAL
  Filled 2014-02-15: qty 1

## 2014-02-15 MED ORDER — MORPHINE SULFATE 2 MG/ML IJ SOLN
1.0000 mg | INTRAMUSCULAR | Status: DC | PRN
Start: 2014-02-15 — End: 2014-02-21
  Administered 2014-02-16 – 2014-02-20 (×7): 1 mg via INTRAVENOUS
  Filled 2014-02-15 (×7): qty 1

## 2014-02-15 NOTE — Progress Notes (Signed)
Notified MD of patient's complaint of increased left hip pain. X-ray was ordered. Placed patient on IV fluids due to her not eating or drinking. Patient stated that, "it hurts too much to sit up". Notified MD of patient's pain issues. Will continue to monitor

## 2014-02-15 NOTE — Progress Notes (Signed)
[facility] PROGRESS NOTE  Christine Pierce WUJ:[id number] DOB: 05/29/1921 DOA: 02/13/2014 PCP: Kimber Relic, MD   80 year old female with history of hypertension, depression, obesity, hyperlipidemia, chronic back pain who presented after a fall and was found in the ER to have moderate L2 compression fracture  Assessment/Plan: 1. L2 compression fracture: new hospital problem - PT evaluation complete and reporting home health PT or SNF.  Patient currently prefers SNF - pain not well controlled on tramadol. Will change medication to norco 7.5/325, should patient become confused will plan on deescalating.  2. UTI: new hospital problem - continue rocephin - awaiting urine culture  3. Copd  - stable, continue bronchodilators  4. CKD - stable  5. DM - Blood sugars well controlled of hypoglycemic agents - most likely diet controlled as outpatient.   Code Status: full Family Communication: Discussed with patient and daughter  Disposition Plan: Discharge planning pending PT recommendations   Consultants:  Physical therapy  Procedures:  None  Antibiotics:  None   HPI/Subjective: Pt states that her pain is not that well controlled still uncomfortable despite tramadol  Objective: Filed Vitals:   02/15/14 0505  BP: 148/76  Pulse: 65  Temp: 98.3 F (36.8 C)  Resp: 18    Intake/Output Summary (Last 24 hours) at 02/15/14 1045 Last data filed at 02/15/14 0809  Gross per 24 hour  Intake    840 ml  Output      0 ml  Net    840 ml   Filed Weights   02/13/14 1726 02/13/14 2359  Weight: 72.576 kg (160 lb) 77.021 kg (169 lb 12.8 oz)    Exam:   General:  Pt in nad, alert and awake  Cardiovascular: rrr, no mrg  Respiratory: cta bl, no wheezes  Abdomen: soft, NT, nd  Musculoskeletal: no cyanosis or clubbing  Data Reviewed: Basic Metabolic Panel:  Recent Labs Lab 02/13/14 1805  NA 140  K 4.8  CL 104  CO2 23  GLUCOSE 94  BUN 33*  CREATININE  1.34*  CALCIUM 10.1   Liver Function Tests: No results for input(s): AST, ALT, ALKPHOS, BILITOT, PROT, ALBUMIN in the last 168 hours. No results for input(s): LIPASE, AMYLASE in the last 168 hours. No results for input(s): AMMONIA in the last 168 hours. CBC:  Recent Labs Lab 02/13/14 1805  WBC 10.7*  HGB 11.6*  HCT 35.0*  MCV 91.9  PLT 174   Cardiac Enzymes: No results for input(s): CKTOTAL, CKMB, CKMBINDEX, TROPONINI in the last 168 hours. BNP (last 3 results)  Recent Labs  12/01/13 1652  PROBNP 187.0*   CBG: No results for input(s): GLUCAP in the last 168 hours.  No results found for this or any previous visit (from the past 240 hour(s)).   Studies: Dg Chest 2 View  02/13/2014   CLINICAL DATA:  80 year old female status post fall  EXAM: CHEST  2 VIEW  COMPARISON:  Prior chest x-ray 09/17/2013  FINDINGS: Stable cardiac and mediastinal contours. Atherosclerotic calcifications present in the aorta. No evidence of a pneumothorax or pleural effusion. Stable central bronchitic change of a mild severity. And upper lumbar compression fracture is incompletely imaged. This represents a new finding compared to 09/10/2012. However, the appearance is suggestive of a chronic injury.  IMPRESSION: 1. No active cardiopulmonary disease. 2. Incompletely imaged upper lumbar compression fracture is an interval finding compared to the prior chest CT from 09/10/2012. However, the appearance suggests a remote injury. 3. Aortic atherosclerosis.   Electronically Signed  By: Malachy MoanHeath  McCullough M.D.   On: 02/13/2014 20:29   Dg Lumbar Spine Complete  02/13/2014   CLINICAL DATA:  Fall with low back pain.  EXAM: LUMBAR SPINE - COMPLETE 4+ VIEW  COMPARISON:  05/02/2010 and 09/22/2008 as well as CT 05/02/2010  FINDINGS: Exam demonstrates diffuse decreased bone mineralization. Exam demonstrates mild spondylosis of the lumbar spine to include facet arthropathy. There is a moderate compression fracture of L2  new since 2012 and likely an acute injury. There is prominent deformity of the anterior superior aspect of the vertebral body. No retropulsion component. There is mild grade 1 anterolisthesis of L4 on L5 unchanged. Disc space narrowing at the L5-S1 level unchanged. T11-12 calcified disc unchanged. Subtle lucency along the superior border of the left sacral canal as cannot completely exclude a fracture in this location.  IMPRESSION: Moderate L2 compression fracture likely acute.  Subtle lucency over the superior border of the left sacral L eft as cannot exclude a fracture.  Mild spondylosis of the lumbar spine with disc disease at the L5-S1 level.  Stable grade 1 anterolisthesis of L4 on L5.   Electronically Signed   By: Elberta Fortisaniel  Boyle M.D.   On: 02/13/2014 20:30   Ct Head Wo Contrast  02/13/2014   CLINICAL DATA:  80 year old who fell while at the nursing home earlier today.  EXAM: CT HEAD WITHOUT CONTRAST  CT CERVICAL SPINE WITHOUT CONTRAST  TECHNIQUE: Multidetector CT imaging of the head and cervical spine was performed following the standard protocol without intravenous contrast. Multiplanar CT image reconstructions of the cervical spine were also generated.  COMPARISON:  CT head and cervical spine 04/11/2012.  FINDINGS: CT HEAD FINDINGS  Moderate to severe age related cortical and deep atrophy, unchanged. Moderate changes of small vessel disease of the cerebral white matter, unchanged. No mass lesion. No midline shift. No acute hemorrhage or hematoma. No extra-axial fluid collections. No evidence of acute infarction. No significant interval change.  No skull fracture or other focal osseous abnormality involving the skull. Mucosal thickening involving the right maxillary sinus. Remaining visualized paranasal sinuses, bilateral mastoid air cells and bilateral middle ear cavities well aerated. Lucency involving the roots of the 3 most posterior right maxillary teeth; this area was not imaged on the prior  examination.  CT CERVICAL SPINE FINDINGS  No fractures identified involving the cervical spine. Sagittal reconstructed images demonstrate anatomic alignment and exaggeration of the usual cervical lordosis. Facet joints intact throughout with severe degenerative changes and fusion of the left facets at C3 and C4. Borderline multifactorial spinal stenosis at C3-4. Chronic central disc protrusion at C3-4 with calcification in the posterior annular fibers. Vacuum disc phenomenon at C5-6. Coronal reformatted images demonstrate an intact craniocervical junction, intact dens, and intact lateral masses throughout. Severe degenerative changes at the C1-C2 articulation.  IMPRESSION: 1. No acute intracranial abnormality. 2. Stable age related moderate to severe cortical atrophy and mild chronic microvascular ischemic changes of the white matter. 3. No cervical spine fractures identified. 4. Multilevel degenerative disc disease, spondylosis and facet degenerative changes throughout the cervical spine with borderline multifactorial spinal stenosis at C3-4. 5. Dental disease involving the posterior right maxillary teeth with periapical lucency.   Electronically Signed   By: Hulan Saashomas  Lawrence M.D.   On: 02/13/2014 19:35   Ct Cervical Spine Wo Contrast  02/13/2014   CLINICAL DATA:  80 year old who fell while at the nursing home earlier today.  EXAM: CT HEAD WITHOUT CONTRAST  CT CERVICAL SPINE WITHOUT CONTRAST  TECHNIQUE:  Multidetector CT imaging of the head and cervical spine was performed following the standard protocol without intravenous contrast. Multiplanar CT image reconstructions of the cervical spine were also generated.  COMPARISON:  CT head and cervical spine 04/11/2012.  FINDINGS: CT HEAD FINDINGS  Moderate to severe age related cortical and deep atrophy, unchanged. Moderate changes of small vessel disease of the cerebral white matter, unchanged. No mass lesion. No midline shift. No acute hemorrhage or hematoma. No  extra-axial fluid collections. No evidence of acute infarction. No significant interval change.  No skull fracture or other focal osseous abnormality involving the skull. Mucosal thickening involving the right maxillary sinus. Remaining visualized paranasal sinuses, bilateral mastoid air cells and bilateral middle ear cavities well aerated. Lucency involving the roots of the 3 most posterior right maxillary teeth; this area was not imaged on the prior examination.  CT CERVICAL SPINE FINDINGS  No fractures identified involving the cervical spine. Sagittal reconstructed images demonstrate anatomic alignment and exaggeration of the usual cervical lordosis. Facet joints intact throughout with severe degenerative changes and fusion of the left facets at C3 and C4. Borderline multifactorial spinal stenosis at C3-4. Chronic central disc protrusion at C3-4 with calcification in the posterior annular fibers. Vacuum disc phenomenon at C5-6. Coronal reformatted images demonstrate an intact craniocervical junction, intact dens, and intact lateral masses throughout. Severe degenerative changes at the C1-C2 articulation.  IMPRESSION: 1. No acute intracranial abnormality. 2. Stable age related moderate to severe cortical atrophy and mild chronic microvascular ischemic changes of the white matter. 3. No cervical spine fractures identified. 4. Multilevel degenerative disc disease, spondylosis and facet degenerative changes throughout the cervical spine with borderline multifactorial spinal stenosis at C3-4. 5. Dental disease involving the posterior right maxillary teeth with periapical lucency.   Electronically Signed   By: Hulan Saas M.D.   On: 02/13/2014 19:35   Dg Humerus Right  02/13/2014   CLINICAL DATA:  Fall. Right upper arm pain. History of osteoporosis.  EXAM: RIGHT HUMERUS - 2+ VIEW  COMPARISON:  10/10/2006  FINDINGS: No fracture observed. The lateral projection raises the possibility of an elbow joint effusion  although this is equivocal. If the patient has symptoms in the vicinity of the elbow, consider dedicated elbow 0 radiography.  IMPRESSION: 1. No acute bony findings. Questionable elbow joint effusion. Correlate with any symptoms in the vicinity of the elbow in determining whether dedicated elbow radiography is warranted.   Electronically Signed   By: Herbie Baltimore M.D.   On: 02/13/2014 20:28    Scheduled Meds: . amphetamine-dextroamphetamine  5 mg Oral BID  . aspirin  81 mg Oral Daily  . cefTRIAXone (ROCEPHIN)  IV  1 g Intravenous Q24H  . citalopram  20 mg Oral QHS  . famotidine  20 mg Oral BID  . heparin  5,000 Units Subcutaneous 3 times per day  . irbesartan  300 mg Oral Daily   And  . hydrochlorothiazide  12.5 mg Oral Daily  . multivitamin with minerals  1 tablet Oral Daily  . oxybutynin  10 mg Oral QHS  . pravastatin  40 mg Oral q1800  . senna-docusate  1 tablet Oral QHS   Continuous Infusions:   Principal Problem:   Lumbar compression fracture Active Problems:   UTI (urinary tract infection)   Anxiety state   CKD (chronic kidney disease) stage 2, GFR 60-89 ml/min   Compression fracture    Time spent: > 35 minutes    Penny Pia  [facility] Pager [phone] If  7PM-7AM, please contact night-coverage at [email or web address], password [email or web address] 02/15/2014, 10:45 AM  LOS: 2 days

## 2014-02-16 MED ORDER — OXYCODONE-ACETAMINOPHEN 5-325 MG PO TABS
1.0000 | ORAL_TABLET | ORAL | Status: DC | PRN
  Administered 2014-02-16 – 2014-02-21 (×12): 1 via ORAL
  Filled 2014-02-16 (×12): qty 1

## 2014-02-16 MED ORDER — OXYCODONE HCL 5 MG PO TABS
5.0000 mg | ORAL_TABLET | ORAL | Status: DC | PRN
  Administered 2014-02-16 – 2014-02-19 (×11): 5 mg via ORAL
  Filled 2014-02-16 (×11): qty 1

## 2014-02-16 MED ORDER — AMLODIPINE BESYLATE 5 MG PO TABS
5.0000 mg | ORAL_TABLET | Freq: Every day | ORAL | Status: DC
  Administered 2014-02-16 – 2014-02-21 (×6): 5 mg via ORAL
  Filled 2014-02-16 (×7): qty 1

## 2014-02-16 NOTE — Progress Notes (Signed)
Rehab Admissions Coordinator Note:  Patient was screened by Trish MageLogue, Shant Hence M for appropriateness for an Inpatient Acute Rehab Consult.  [facility]  Could consider inpatient rehab consult, but she is doing well with mobility [facility]  She may not need an inpatient rehab stay.  Trish MageLogue, Kenna Kirn M 02/16/2014, 10:39 AM  I can be reached at [phone].

## 2014-02-16 NOTE — Progress Notes (Addendum)
[facility] PROGRESS NOTE  Christine Pierce AVW:[id number] DOB: 1922-01-10 DOA: 02/13/2014 PCP: Kimber RelicGREEN, ARTHUR G, MD   80 year old female with history of hypertension, depression, obesity, hyperlipidemia, chronic back pain who presented after a fall and was found in the ER to have moderate L2 compression fracture  Assessment/Plan: 1. L2 compression fracture: new hospital problem - PT evaluation complete and reporting home health PT or SNF. - Pain not well controlled on tramadol. Will change medication to Percocet with oxy IR for breakthrough medication. - Consult social worker to discuss discharge options with patient so that a safe discharge plan can be made.  2. UTI: new hospital problem - continue rocephin - UC: > 100,000 cfu of e coli  3. Copd  - stable, continue bronchodilators  4. CKD - stable  5. DM - Blood sugars well controlled of hypoglycemic agents - most likely diet controlled as outpatient.  Addendum: 6 HTN - not well controlled, pain contributing most likely - will add amlodipine  Code Status: full Family Communication: Discussed with patient and daughter  Disposition Plan: Discharge planning pending PT recommendations   Consultants:  Physical therapy  Procedures:  None  Antibiotics:  None   HPI/Subjective: Still uncomfortable with movement. L hip x ray negative   Objective: Filed Vitals:   02/16/14 0522  BP: 178/87  Pulse: 66  Temp: 98.2 F (36.8 C)  Resp: 18    Intake/Output Summary (Last 24 hours) at 02/16/14 1252 Last data filed at 02/15/14 2010  Gross per 24 hour  Intake    120 ml  Output      0 ml  Net    120 ml   Filed Weights   02/13/14 1726 02/13/14 2359  Weight: 72.576 kg (160 lb) 77.021 kg (169 lb 12.8 oz)    Exam:   General:  Pt in nad, alert and awake  Cardiovascular: none cyanotic, pink extremities  Respiratory: no wheezes, no increased wob  Abdomen: soft, NT, nd  Musculoskeletal: no cyanosis or  clubbing  Data Reviewed: Basic Metabolic Panel:  Recent Labs Lab 02/13/14 1805  NA 140  K 4.8  CL 104  CO2 23  GLUCOSE 94  BUN 33*  CREATININE 1.34*  CALCIUM 10.1   Liver Function Tests: No results for input(s): AST, ALT, ALKPHOS, BILITOT, PROT, ALBUMIN in the last 168 hours. No results for input(s): LIPASE, AMYLASE in the last 168 hours. No results for input(s): AMMONIA in the last 168 hours. CBC:  Recent Labs Lab 02/13/14 1805  WBC 10.7*  HGB 11.6*  HCT 35.0*  MCV 91.9  PLT 174   Cardiac Enzymes: No results for input(s): CKTOTAL, CKMB, CKMBINDEX, TROPONINI in the last 168 hours. BNP (last 3 results)  Recent Labs  12/01/13 1652  PROBNP 187.0*   CBG: No results for input(s): GLUCAP in the last 168 hours.  Recent Results (from the past 240 hour(s))  Urine culture     Status: None (Preliminary result)   Collection Time: 02/13/14  8:40 PM  Result Value Ref Range Status   Specimen Description URINE, CLEAN CATCH  Final   Special Requests ADDED [id number]  Final   Culture  Setup Time   Final    02/14/2014 06:46 Performed at [facility]    Colony Count   Final    >=100,000 COLONIES/ML Performed at [facility]    Culture   Final    ESCHERICHIA COLI Performed at [facility]    Report Status PENDING  Incomplete  Studies: Dg Hip Complete Left  02/15/2014   CLINICAL DATA:  Initial evaluation for left hip pain, fell 2 days ago  EXAM: LEFT HIP - COMPLETE 2+ VIEW  COMPARISON:  None.  FINDINGS: Mild to moderate bilateral hip arthritis. No femur fracture. Pelvic bones intact. Pubic symphysitis. Bilateral sacroiliitis.  IMPRESSION: No acute findings.   Electronically Signed   By: Esperanza Heiraymond  Rubner M.D.   On: 02/15/2014 19:43    Scheduled Meds: . amphetamine-dextroamphetamine  5 mg Oral BID  . aspirin  81 mg Oral Daily  . cefTRIAXone (ROCEPHIN)  IV  1 g Intravenous Q24H  . citalopram  20 mg Oral QHS  . famotidine  20 mg Oral BID   . heparin  5,000 Units Subcutaneous 3 times per day  . irbesartan  300 mg Oral Daily   And  . hydrochlorothiazide  12.5 mg Oral Daily  . multivitamin with minerals  1 tablet Oral Daily  . oxybutynin  10 mg Oral QHS  . pravastatin  40 mg Oral q1800  . senna-docusate  1 tablet Oral QHS   Continuous Infusions:   Principal Problem:   Lumbar compression fracture Active Problems:   UTI (urinary tract infection)   Anxiety state   CKD (chronic kidney disease) stage 2, GFR 60-89 ml/min   Diabetes mellitus with renal manifestations, controlled   Compression fracture    Time spent: > 35 minutes    Penny PiaVEGA, Saida Lonon  [facility] Pager [phone] If 7PM-7AM, please contact night-coverage at [email or web address], password [email or web address] 02/16/2014, 12:52 PM  LOS: 3 days

## 2014-02-16 NOTE — Progress Notes (Signed)
Physical Therapy Treatment Patient Details Name: Wells GuilesMary J Fuster MRN: [id number] DOB: 23-May-1921 Today's Date: 02/16/2014    History of Present Illness 80 y.o. female, resident of assisted living, with mechanical fall. X ray of her LS spine, shows moderate L2 compressive Fx, along with a UA showing a UTI.    PT Comments    Patient limited by pain this session and unable to tolerate sitting in chair more than 15 minutes, also unable to tolerate ambulation.  Will need SNF rehab upon d/c.  Follow Up Recommendations  SNF;Supervision/Assistance - 24 hour     Equipment Recommendations  None recommended by PT    Recommendations for Other Services       Precautions / Restrictions Precautions Precautions: Back Required Braces or Orthoses: Spinal Brace Spinal Brace: Lumbar corset;Applied in sitting position    Mobility  Bed Mobility Overal bed mobility: Needs Assistance Bed Mobility: Sit to Sidelying Rolling: Min assist Sidelying to sit: Mod assist     Sit to sidelying: Min assist General bed mobility comments: rail and cues with assist for side to sit  Transfers Overall transfer level: Needs assistance Equipment used: Rolling walker (2 wheeled) Transfers: Sit to/from [employer];Stand Pivot Transfers Sit to Stand: Min assist Stand pivot transfers: Min assist       General transfer comment[facility], quickly needing to sit due to pain, then to recliner, then to bed  Ambulation/Gait         Gait velocity: unable to due to pain       Stairs            Wheelchair Mobility    Modified Rankin (Stroke Patients Only)       Balance Overall balance assessment: Needs assistance     Sitting balance - Comments: needs ue support due to pain     Standing balance-Leahy Scale: Poor                      Cognition Arousal/Alertness: Awake/alert Behavior During Therapy: WFL for tasks assessed/performed Overall Cognitive Status: Within Functional  Limits for tasks assessed                      Exercises      General Comments        Pertinent Vitals/Pain Pain Score: 10-Worst pain ever Pain Location: thighs and left hip Pain Descriptors / Indicators: Jabbing Pain Intervention(s): Limited activity within patient's tolerance;Monitored during session;Patient requesting pain meds-RN notified    Home Living                      Prior Function            PT Goals (current goals can now be found in the care plan section) Progress towards PT goals: Progressing toward goals    Frequency  Min 3X/week    PT Plan Current plan remains appropriate    Co-evaluation             End of Session Equipment Utilized During Treatment: Gait belt;Back brace Activity Tolerance: Patient limited by pain Patient left: in bed;with call bell/phone within reach     Time: 1400-1455 PT Time Calculation (min) (ACUTE ONLY): 55 min  Charges:  $Therapeutic Activity: 53-67 mins                    G Codes:      WYNN,CYNDI 02/16/2014, 4:28 PM Sheran Lawlessyndi Wynn, PT [phone] 02/16/2014

## 2014-02-16 NOTE — Clinical Social Work Psychosocial (Signed)
Clinical Social Work Department BRIEF PSYCHOSOCIAL ASSESSMENT 02/16/2014  Patient:  Christine GuilesWHEATON,Christine J     Account Number:  [id number]     Admit date:  02/13/2014  Clinical Social Worker:  Read DriversINGLE,Phillippa Straub, LCSWA  Date/Time:  02/16/2014 01:40 PM  Referred by:  Physician  Date Referred:  02/16/2014 Referred for  SNF Placement  Other - See comment   Other Referral:   none   Interview type:  Other - See comment Other interview type:   Patient and patient's Power of Attorney, AvnetClarisse Pierce    PSYCHOSOCIAL DATA Living Status:  FACILITY Admitted from facility:  Other Level of care:   Primary support name:  Christine Pierce Primary support relationship to patient:  FRIEND Degree of support available:   Power of Attorney strong support system in place    CURRENT CONCERNS Current Concerns  Post-Acute Placement   Other Concerns:   none    SOCIAL WORK ASSESSMENT / PLAN CSW assessed pt at bedside. Patient's power of attorney, Darden AmberClarisse was also present during this assessment.  POA voiced frustration with medical staff not contacting POA prior to this time.  CSW reviewed with POA when the POA would go into effect.  At this time, patient is alert and oriented x4 and POA would not be in effect though POA is welcomed to assist patient in making decisions.  patient wishes for POA to be involved in the process of placement. Medical staff aware.    Patient and POA state the first preference is for patient to be able to return to her [facility] [address] at time of discharge.  Patient would need home health therapy, [facility]  Patient and POA aware.  At this time, PT worked with patient last on Saturday 02/14/14.  patient will need to be re-evaluated prior to CSW contacting ALF for request to return at which time, ALF will evaluate/review clinicals for possible return.  Patient and POA are agreeable to SNF backup.    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 Both are hopeful that patient would be able to return to ALF and continue to be independent at time of dc.   Assessment/plan status:  Psychosocial Support/Ongoing Assessment of Needs Other assessment/ plan:   FL2  PASARR   Information/referral to community resources:   STR/SNF  ALF- RNCM- HH    PATIENT'S/FAMILY'S RESPONSE TO PLAN OF CARE: Patient is agreeable to SNF backup to first preference - returning to ALF with home health services (final disposition pending PT re-evaluation)       Vickii PennaGina Hinton Luellen, LCSWA [phone]  Psychiatric & Orthopedics (5N 1-16) Clinical Social Worker

## 2014-02-17 LAB — URINE CULTURE

## 2014-02-17 NOTE — Clinical Social Work Note (Signed)
CSW spoke with POA and patient at bedside.  Per report, patient is having pain management issues. POA is requesting MD Cena BentonVega consult with patient's ortho MD Been and gerontologist MD, Neva SeatGreene prior to dc.  CSW notified MD Cena BentonVega regarding this request.  POA is at bedside requesting to speak with MD.  MD made aware.    POA chooses for patient to receive STR at [facility] at time of dc.  Patient will be transported via [facility].  Vickii PennaGina Daxson Reffett, LCSWA [phone]  Psychiatric & Orthopedics (5N 1-16) Clinical Social Worker

## 2014-02-17 NOTE — Progress Notes (Signed)
[facility] PROGRESS NOTE  Christine Pierce WUJ:[id number] DOB: [birth year] DOA: 02/13/2014 PCP: Kimber RelicGREEN, ARTHUR G, MD   80 year old female with history of hypertension, depression, obesity, hyperlipidemia, chronic back pain who presented after a fall and was found in the ER to have moderate L2 compression fracture  Assessment/Plan: 1. L2 compression fracture: new hospital problem - PT evaluation complete and reporting SNF on discharge - POA very opinionated and after reading on the internet about compression fractures she want to know why orthopaedic surgery was not contacted. I have indicated that we are trying conservative measures first. She indicated that she wanted Orthopaedic surgery consulted and would like their opinion.  - I will reach out to Orthopaedic surgery and discuss case with her Orthopaedic surgeon prior to contacting IR for consideration of kyphoplasty  2. UTI: new hospital problem - Continue rocephin as E Coli is currently - UC: > 100,000 cfu of e coli  3. Copd  - stable, continue bronchodilators  4. CKD - stable  5. DM - Blood sugars well controlled of hypoglycemic agents - most likely diet controlled as outpatient.  6 HTN - not well controlled, pain contributing most likely - improvement with administration of amlodipine  Code Status: full Family Communication: Discussed with patient and daughter  Disposition Plan: Discharge planning pending PT recommendations   Consultants:  Physical therapy  Procedures:  None  Antibiotics:  None   HPI/Subjective: Patient still reporting much pain with movement. POA requesting input from her orthopaedic surgeon.  Objective: Filed Vitals:   02/17/14 0534  BP: 140/68  Pulse: 74  Temp: 98.8 F (37.1 C)  Resp: 18    Intake/Output Summary (Last 24 hours) at 02/17/14 1112 Last data filed at 02/17/14 0900  Gross per 24 hour  Intake    650 ml  Output      1 ml  Net    649 ml   Filed Weights   02/13/14 1726 02/13/14 2359  Weight: 72.576 kg (160 lb) 77.021 kg (169 lb 12.8 oz)    Exam:   General:  Pt in nad, alert and awake  Cardiovascular: none cyanotic, pink extremities  Respiratory: no wheezes, no increased wob  Abdomen:  NT, nd, no guarding  Musculoskeletal: no cyanosis or clubbing  Data Reviewed: Basic Metabolic Panel:  Recent Labs Lab 02/13/14 1805  NA 140  K 4.8  CL 104  CO2 23  GLUCOSE 94  BUN 33*  CREATININE 1.34*  CALCIUM 10.1   Liver Function Tests: No results for input(s): AST, ALT, ALKPHOS, BILITOT, PROT, ALBUMIN in the last 168 hours. No results for input(s): LIPASE, AMYLASE in the last 168 hours. No results for input(s): AMMONIA in the last 168 hours. CBC:  Recent Labs Lab 02/13/14 1805  WBC 10.7*  HGB 11.6*  HCT 35.0*  MCV 91.9  PLT 174   Cardiac Enzymes: No results for input(s): CKTOTAL, CKMB, CKMBINDEX, TROPONINI in the last 168 hours. BNP (last 3 results)  Recent Labs  12/01/13 1652  PROBNP 187.0*   CBG: No results for input(s): GLUCAP in the last 168 hours.  Recent Results (from the past 240 hour(s))  Urine culture     Status: None   Collection Time: 02/13/14  8:40 PM  Result Value Ref Range Status   Specimen Description URINE, CLEAN CATCH  Final   Special Requests ADDED [id number]  Final   Culture  Setup Time   Final    02/14/2014 06:46 Performed at [facility]  Colony Count   Final    >=100,000 COLONIES/ML Performed at [facility]    Culture   Final    ESCHERICHIA COLI Performed at [facility]    Report Status 02/17/2014 FINAL  Final   Organism ID, Bacteria ESCHERICHIA COLI  Final      Susceptibility   Escherichia coli - MIC*    AMPICILLIN <=2 SENSITIVE Sensitive     CEFAZOLIN <=4 SENSITIVE Sensitive     CEFTRIAXONE <=1 SENSITIVE Sensitive     CIPROFLOXACIN <=0.25 SENSITIVE Sensitive     GENTAMICIN <=1 SENSITIVE Sensitive     LEVOFLOXACIN <=0.12 SENSITIVE Sensitive      NITROFURANTOIN <=16 SENSITIVE Sensitive     TOBRAMYCIN <=1 SENSITIVE Sensitive     TRIMETH/SULFA >=320 RESISTANT Resistant     PIP/TAZO <=4 SENSITIVE Sensitive     * ESCHERICHIA COLI     Studies: Dg Hip Complete Left  02/15/2014   CLINICAL DATA:  Initial evaluation for left hip pain, fell 2 days ago  EXAM: LEFT HIP - COMPLETE 2+ VIEW  COMPARISON:  None.  FINDINGS: Mild to moderate bilateral hip arthritis. No femur fracture. Pelvic bones intact. Pubic symphysitis. Bilateral sacroiliitis.  IMPRESSION: No acute findings.   Electronically Signed   By: Esperanza Heiraymond  Rubner M.D.   On: 02/15/2014 19:43    Scheduled Meds: . amLODipine  5 mg Oral Daily  . amphetamine-dextroamphetamine  5 mg Oral BID  . aspirin  81 mg Oral Daily  . cefTRIAXone (ROCEPHIN)  IV  1 g Intravenous Q24H  . citalopram  20 mg Oral QHS  . famotidine  20 mg Oral BID  . heparin  5,000 Units Subcutaneous 3 times per day  . irbesartan  300 mg Oral Daily   And  . hydrochlorothiazide  12.5 mg Oral Daily  . multivitamin with minerals  1 tablet Oral Daily  . oxybutynin  10 mg Oral QHS  . pravastatin  40 mg Oral q1800  . senna-docusate  1 tablet Oral QHS   Continuous Infusions:   Principal Problem:   Lumbar compression fracture Active Problems:   UTI (urinary tract infection)   Anxiety state   CKD (chronic kidney disease) stage 2, GFR 60-89 ml/min   Diabetes mellitus with renal manifestations, controlled   Compression fracture    Time spent: > 35 minutes    Penny PiaVEGA, Tatelyn Vanhecke  [facility] Pager [phone] If 7PM-7AM, please contact night-coverage at [email or web address], password [email or web address] 02/17/2014, 11:12 AM  LOS: 4 days

## 2014-02-18 ENCOUNTER — Inpatient Hospital Stay (HOSPITAL_COMMUNITY): Payer: Medicare Other

## 2014-02-18 MED ORDER — WHITE PETROLATUM GEL
Status: AC
  Administered 2014-02-18: 0.4
  Filled 2014-02-18: qty 5

## 2014-02-18 MED ORDER — POLYETHYLENE GLYCOL 3350 17 G PO PACK
17.0000 g | PACK | Freq: Once | ORAL | Status: AC
  Administered 2014-02-18: 17 g via ORAL
  Filled 2014-02-18 (×2): qty 1

## 2014-02-18 MED ORDER — OXYCODONE HCL ER 10 MG PO T12A
10.0000 mg | EXTENDED_RELEASE_TABLET | Freq: Two times a day (BID) | ORAL | Status: DC
  Administered 2014-02-18 – 2014-02-21 (×5): 10 mg via ORAL
  Filled 2014-02-18 (×5): qty 1

## 2014-02-18 NOTE — Progress Notes (Signed)
Patient ID: Christine Pierce, female   DOB: 09-21-1921, 80 y.o.   MRN: [id number]   Request for evaluation of pt for possible L2 VP/KP And or Sacroplasty  MRI Lumbar and Sacrum ordered  Pt and Medical POA all in agreement All questions answered to satisfaction

## 2014-02-18 NOTE — Progress Notes (Signed)
[facility] PROGRESS NOTE  Christine Pierce ZOX:[id number] DOB: Feb 08, 1922 DOA: 02/13/2014 PCP: Kimber RelicGREEN, ARTHUR G, MD   80 year old female with history of hypertension, depression, obesity, hyperlipidemia, chronic back pain who presented after a fall and was found in the ER to have moderate L2 compression fracture  HPI/Subjective: Continues to have a significant amount of pain in her mid lower back despite increase in medications.  Assessment/Plan:  L2 compression fracture: And possible left sacral fracture -new hospital problem - Pain medications changed to oxycodone/Percocet but patient still continues to have significant amount of pain therefore, will add OxyContin 10 mg every 12 hours -Dr. Cena BentonVega discussed current issues with compression fracture and pain with Dr. August Saucerean who is the patient's orthopedist and it was recommended to obtain an IR consult if pain is significant -IR has evaluated the patient and ordered an MRI of lumbosacral spine which I agree with - Plan for discharge to SNF when stable   UTI: new hospital problem - Has received 4 days rocephin-I will discontinue it now - UC: > 100,000 cfu of e coli sensitive to Rocephin  COPD - stable, continue bronchodilators  CKD - stable   DM - Blood sugars well controlled  - Last A1c 6.2 on 11/16 -Not checking sugars while in the hospital  HTN - not well controlled- pain likely contributing and we are attempting to control her pain -  amlodipine added 12/15  Dental disease? -Lucency noted posterior to the right maxillary teeth with periapical lucency on head CT   Code Status: full Family Communication: Discussed with patient  Disposition Plan: SNF-the patient was staying at one prior to being admitted to the hospital   Consultants:  IR  Procedures:  None  Antibiotics:  None     Objective: Filed Vitals:   02/18/14 0929  BP: 174/69  Pulse: 68  Temp:   Resp:     Intake/Output Summary (Last 24 hours)  at 02/18/14 1320 Last data filed at 02/18/14 0650  Gross per 24 hour  Intake    600 ml  Output      0 ml  Net    600 ml   Filed Weights   02/13/14 1726 02/13/14 2359  Weight: 72.576 kg (160 lb) 77.021 kg (169 lb 12.8 oz)    Exam:   General:  Pt in nad, alert and awake  Cardiovascular: Regular rate and rhythm, 2/6 murmur at right upper sternal border that does not radiate to the carotids  Respiratory: no wheezes, no increased wob  Abdomen: soft, NT, nd  Musculoskeletal: no cyanosis or clubbing- tenderness in lower lumbar spine when palpated  Data Reviewed: Basic Metabolic Panel:  Recent Labs Lab 02/13/14 1805  NA 140  K 4.8  CL 104  CO2 23  GLUCOSE 94  BUN 33*  CREATININE 1.34*  CALCIUM 10.1   Liver Function Tests: No results for input(s): AST, ALT, ALKPHOS, BILITOT, PROT, ALBUMIN in the last 168 hours. No results for input(s): LIPASE, AMYLASE in the last 168 hours. No results for input(s): AMMONIA in the last 168 hours. CBC:  Recent Labs Lab 02/13/14 1805  WBC 10.7*  HGB 11.6*  HCT 35.0*  MCV 91.9  PLT 174   Cardiac Enzymes: No results for input(s): CKTOTAL, CKMB, CKMBINDEX, TROPONINI in the last 168 hours. BNP (last 3 results)  Recent Labs  12/01/13 1652  PROBNP 187.0*   CBG: No results for input(s): GLUCAP in the last 168 hours.  Recent Results (from the past 240 hour(s))  Urine culture     Status: None   Collection Time: 02/13/14  8:40 PM  Result Value Ref Range Status   Specimen Description URINE, CLEAN CATCH  Final   Special Requests ADDED [phone]  Final   Culture  Setup Time   Final    02/14/2014 06:46 Performed at [facility]    Colony Count   Final    >=100,000 COLONIES/ML Performed at [facility]    Culture   Final    ESCHERICHIA COLI Performed at [facility]    Report Status 02/17/2014 FINAL  Final   Organism ID, Bacteria ESCHERICHIA COLI  Final      Susceptibility   Escherichia coli -  MIC*    AMPICILLIN <=2 SENSITIVE Sensitive     CEFAZOLIN <=4 SENSITIVE Sensitive     CEFTRIAXONE <=1 SENSITIVE Sensitive     CIPROFLOXACIN <=0.25 SENSITIVE Sensitive     GENTAMICIN <=1 SENSITIVE Sensitive     LEVOFLOXACIN <=0.12 SENSITIVE Sensitive     NITROFURANTOIN <=16 SENSITIVE Sensitive     TOBRAMYCIN <=1 SENSITIVE Sensitive     TRIMETH/SULFA >=320 RESISTANT Resistant     PIP/TAZO <=4 SENSITIVE Sensitive     * ESCHERICHIA COLI     Studies: No results found.  Scheduled Meds: . amLODipine  5 mg Oral Daily  . amphetamine-dextroamphetamine  5 mg Oral BID  . aspirin  81 mg Oral Daily  . citalopram  20 mg Oral QHS  . famotidine  20 mg Oral BID  . heparin  5,000 Units Subcutaneous 3 times per day  . irbesartan  300 mg Oral Daily   And  . hydrochlorothiazide  12.5 mg Oral Daily  . multivitamin with minerals  1 tablet Oral Daily  . oxybutynin  10 mg Oral QHS  . OxyCODONE  10 mg Oral Q12H  . pravastatin  40 mg Oral q1800  . senna-docusate  1 tablet Oral QHS   Continuous Infusions:    Time spent: > 35 minutes    Christine Pierce  [facility] [email or web address], password [email or web address] 02/18/2014, 1:20 PM  LOS: 5 days

## 2014-02-18 NOTE — Progress Notes (Signed)
Physical Therapy Treatment Patient Details Name: Christine GuilesMary J Romaniello MRN: [id number] DOB: Feb 27, 1922 Today's Date: 02/18/2014    History of Present Illness 80 y.o. female, resident of assisted living, with mechanical fall. X ray of her LS spine, shows moderate L2 compressive Fx, along with a UA showing a UTI.    PT Comments    Pt unsafe to mobilize this session secondary to pain. Pt tolerated standing ~1 min then unexpectedly returned to sitting position due to pain. Will cont to follow per POC. Pt is a fall risk.   Follow Up Recommendations  SNF;Supervision/Assistance - 24 hour     Equipment Recommendations  None recommended by PT    Recommendations for Other Services OT consult     Precautions / Restrictions Precautions Precautions: Fall;Back Precaution Booklet Issued: Yes (comment) Precaution Comments: reviewed handout with pt; pt unable to recall back precautions  Required Braces or Orthoses: Spinal Brace Spinal Brace: Lumbar corset;Applied in sitting position Restrictions Weight Bearing Restrictions: No    Mobility  Bed Mobility Overal bed mobility: Needs Assistance Bed Mobility: Rolling;Sidelying to Sit;Sit to Sidelying Rolling: Min guard Sidelying to sit: Mod assist     Sit to sidelying: Mod assist General bed mobility comments: cues for log rolling technique; (A) to power up; incr time due to pain and weakness  Transfers Overall transfer level: Needs assistance Equipment used: Rolling walker (2 wheeled) Transfers: Sit to/from Stand Sit to Stand: Mod assist         General transfer comment: pt pulling up on RW and disregarding cues for safety; pt with heavy lean posteriorly ; pt returning to seated position after standing fro ~1 min; pt returning to sit unexpectantly and unsafely   Ambulation/Gait             General Gait Details: pt grimacing in pain; unable to ambulate this session   Stairs            Wheelchair Mobility    Modified  Rankin (Stroke Patients Only)       Balance Overall balance assessment: Needs assistance;History of Falls Sitting-balance support: Feet supported;Single extremity supported Sitting balance-Leahy Scale: Poor Sitting balance - Comments: pt guarded and leaning to Rt during sitting EOB; denied any dizziness Postural control: Right lateral lean;Posterior lean Standing balance support: During functional activity;Bilateral upper extremity supported Standing balance-Leahy Scale: Poor Standing balance comment: relying on RW and mod (A) to balance; pt returning to sitting position without warning and disregarding safety                     Cognition Arousal/Alertness: Awake/alert Behavior During Therapy: Impulsive Overall Cognitive Status: No family/caregiver present to determine baseline cognitive functioning Area of Impairment: Orientation;Memory;Following commands;Safety/judgement;Problem solving;Awareness Orientation Level: Disoriented to;Situation;Time;Place   Memory: Decreased short-term memory;Decreased recall of precautions Following Commands: Follows one step commands with increased time Safety/Judgement: Decreased awareness of deficits;Decreased awareness of safety Awareness: Anticipatory Problem Solving: Slow processing;Decreased initiation;Requires tactile cues;Difficulty sequencing;Requires verbal cues General Comments: no family present; pt disoriented today and confabulating throughout session; pt does not recall previous therapy session; stated she has not walked since fall last week    Exercises Low Level/ICU Exercises Ankle Circles/Pumps: AROM;Both;10 reps;Seated    General Comments        Pertinent Vitals/Pain Pain Assessment: Faces Faces Pain Scale: Hurts worst Pain Location: Low back Pain Descriptors / Indicators: Grimacing;Crying Pain Intervention(s): Limited activity within patient's tolerance;Monitored during session;Premedicated before  session;Repositioned    Home Living  Prior Function            PT Goals (current goals can now be found in the care plan section) Acute Rehab PT Goals 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 PT Goal Formulation: With patient/family Time For Goal Achievement: 02/28/14 Potential to Achieve Goals: Good Progress towards PT goals: Progressing toward goals    Frequency  Min 3X/week    PT Plan Current plan remains appropriate    Co-evaluation             End of Session Equipment Utilized During Treatment: Gait belt;Back brace Activity Tolerance: Patient limited by pain;Patient limited by fatigue Patient left: in bed;with call bell/phone within reach;with bed alarm set     Time: [id number] PT Time Calculation (min) (ACUTE ONLY): 15 min  Charges:  $Therapeutic Activity: 8-22 mins                    G CodesDonell Sievert:      Shavawn Stobaugh N, [place] CarolinaPT  [phone] 02/18/2014, 3:13 PM

## 2014-02-19 DIAGNOSIS — S32020A Wedge compression fracture of second lumbar vertebra, initial encounter for closed fracture: Secondary | ICD-10-CM

## 2014-02-19 DIAGNOSIS — S32020S Wedge compression fracture of second lumbar vertebra, sequela: Secondary | ICD-10-CM

## 2014-02-19 DIAGNOSIS — E1121 Type 2 diabetes mellitus with diabetic nephropathy: Secondary | ICD-10-CM

## 2014-02-19 LAB — CBC
HEMATOCRIT: 36.5 % (ref 36.0–46.0)
Hemoglobin: 12.1 g/dL (ref 12.0–15.0)
MCH: 30.2 pg (ref 26.0–34.0)
MCHC: 33.2 g/dL (ref 30.0–36.0)
MCV: 91 fL (ref 78.0–100.0)
Platelets: 205 10*3/uL (ref 150–400)
RBC: 4.01 MIL/uL (ref 3.87–5.11)
RDW: 14.2 % (ref 11.5–15.5)
WBC: 8.5 10*3/uL (ref 4.0–10.5)

## 2014-02-19 LAB — PROTIME-INR
INR: 1.01 (ref 0.00–1.49)
PROTHROMBIN TIME: 13.4 s (ref 11.6–15.2)

## 2014-02-19 LAB — URINALYSIS, ROUTINE W REFLEX MICROSCOPIC
BILIRUBIN URINE: NEGATIVE
Glucose, UA: NEGATIVE mg/dL
Hgb urine dipstick: NEGATIVE
Ketones, ur: 15 mg/dL — AB
Nitrite: NEGATIVE
PROTEIN: NEGATIVE mg/dL
Specific Gravity, Urine: 1.016 (ref 1.005–1.030)
UROBILINOGEN UA: 0.2 mg/dL (ref 0.0–1.0)
pH: 6 (ref 5.0–8.0)

## 2014-02-19 LAB — BASIC METABOLIC PANEL
ANION GAP: 12 (ref 5–15)
BUN: 20 mg/dL (ref 6–23)
CHLORIDE: 94 meq/L — AB (ref 96–112)
CO2: 29 meq/L (ref 19–32)
Calcium: 9.8 mg/dL (ref 8.4–10.5)
Creatinine, Ser: 0.92 mg/dL (ref 0.50–1.10)
GFR calc Af Amer: 62 mL/min — ABNORMAL LOW (ref >90)
GFR calc non Af Amer: 53 mL/min — ABNORMAL LOW (ref >90)
Glucose, Bld: 93 mg/dL (ref 70–99)
POTASSIUM: 3.9 meq/L (ref 3.7–5.3)
Sodium: 135 mEq/L — ABNORMAL LOW (ref 137–147)

## 2014-02-19 LAB — URINE MICROSCOPIC-ADD ON

## 2014-02-19 LAB — APTT: aPTT: 30 seconds (ref 24–37)

## 2014-02-19 MED ORDER — CEFAZOLIN SODIUM-DEXTROSE 2-3 GM-% IV SOLR
2.0000 g | INTRAVENOUS | Status: AC
  Administered 2014-02-20: 2 g via INTRAVENOUS
  Filled 2014-02-19: qty 50

## 2014-02-19 MED ORDER — BISACODYL 10 MG RE SUPP
10.0000 mg | Freq: Once | RECTAL | Status: AC
  Administered 2014-02-19: 10 mg via RECTAL
  Filled 2014-02-19: qty 1

## 2014-02-19 MED ORDER — HEPARIN SODIUM (PORCINE) 5000 UNIT/ML IJ SOLN
5000.0000 [IU] | Freq: Three times a day (TID) | INTRAMUSCULAR | Status: DC
  Administered 2014-02-19 – 2014-02-21 (×5): 5000 [IU] via SUBCUTANEOUS
  Filled 2014-02-19 (×6): qty 1

## 2014-02-19 MED ORDER — HEPARIN SODIUM (PORCINE) 5000 UNIT/ML IJ SOLN: 5000.0000 [IU] | Freq: Three times a day (TID) | INTRAMUSCULAR | Status: DC

## 2014-02-19 NOTE — Progress Notes (Deleted)
[facility] PROGRESS NOTE  Christine Pierce ZOX:[id number] DOB: May 19, 1921 DOA: 02/13/2014 PCP: Kimber RelicGREEN, ARTHUR G, MD   80 year old female with history of hypertension, depression, obesity, hyperlipidemia, chronic back pain who presented after a fall and was found in the ER to have moderate L2 compression fracture  HPI/Subjective: Continues to have a significant amount of pain in her mid lower back despite increase in medications.  Assessment/Plan:  L2 compression fracture:  -new hospital problem - Pain medications changed to oxycodone/Percocet but patient as she continued to have significant amount of pain, added OxyContin 10 mg every 12 hours- pain better controlled today- she did not need Oxycodone through the night -Dr. Cena BentonVega discussed current issues with compression fracture and pain with Dr. August Saucerean who is the patient's orthopedist and it was recommended to obtain an IR consult if pain is significant -IR has evaluated the patient- MRI L spine reveals progression of L2 fx which is now sever with severe central canal stenosis at L2- patient still able to ambulate - Plan for discharge to SNF when stable   UTI: new hospital problem - Has received 4 days rocephin- discontinued on 12/16 - UC: > 100,000 cfu of e coli sensitive to Rocephin  COPD - stable, continue bronchodilators  CKD - stable   DM - Blood sugars well controlled  - Last A1c 6.2 on 11/16 -Not checking sugars while in the hospital  HTN -  amlodipine added 12/15- better controlled now  Dental disease? -Lucency noted posterior to the right maxillary teeth with periapical lucency on head CT   Code Status: full Family Communication: Discussed with patient  Disposition Plan: SNF-the patient was staying at one prior to being admitted to the hospital   Consultants:  IR  Procedures:  None  Antibiotics:  None     Objective: Filed Vitals:   02/19/14 0619  BP: 137/73  Pulse: 84  Temp: 98.1 F (36.7 C)   Resp: 17    Intake/Output Summary (Last 24 hours) at 02/19/14 1331 Last data filed at 02/18/14 1915  Gross per 24 hour  Intake    120 ml  Output      0 ml  Net    120 ml   Filed Weights   02/13/14 1726 02/13/14 2359  Weight: 72.576 kg (160 lb) 77.021 kg (169 lb 12.8 oz)    Exam:   General:  Pt in nad, alert and awake  Cardiovascular: Regular rate and rhythm, 2/6 murmur at right upper sternal border that does not radiate to the carotids  Respiratory: no wheezes, no increased wob, no crackles  Abdomen: soft, NT, ND, BS+   Musculoskeletal: no cyanosis or clubbing- tenderness in lower lumbar spine when palpated  Data Reviewed: Basic Metabolic Panel:  Recent Labs Lab 02/13/14 1805 02/19/14 0931  NA 140 135*  K 4.8 3.9  CL 104 94*  CO2 23 29  GLUCOSE 94 93  BUN 33* 20  CREATININE 1.34* 0.92  CALCIUM 10.1 9.8   Liver Function Tests: No results for input(s): AST, ALT, ALKPHOS, BILITOT, PROT, ALBUMIN in the last 168 hours. No results for input(s): LIPASE, AMYLASE in the last 168 hours. No results for input(s): AMMONIA in the last 168 hours. CBC:  Recent Labs Lab 02/13/14 1805 02/19/14 1218  WBC 10.7* 8.5  HGB 11.6* 12.1  HCT 35.0* 36.5  MCV 91.9 91.0  PLT 174 205   Cardiac Enzymes: No results for input(s): CKTOTAL, CKMB, CKMBINDEX, TROPONINI in the last 168 hours. BNP (last 3 results)  Recent Labs  12/01/13 1652  PROBNP 187.0*   CBG: No results for input(s): GLUCAP in the last 168 hours.  Recent Results (from the past 240 hour(s))  Urine culture     Status: None   Collection Time: 02/13/14  8:40 PM  Result Value Ref Range Status   Specimen Description URINE, CLEAN CATCH  Final   Special Requests ADDED [id number]  Final   Culture  Setup Time   Final    02/14/2014 06:46 Performed at [facility]    Colony Count   Final    >=100,000 COLONIES/ML Performed at [facility]    Culture   Final    ESCHERICHIA COLI Performed at  [facility]    Report Status 02/17/2014 FINAL  Final   Organism ID, Bacteria ESCHERICHIA COLI  Final      Susceptibility   Escherichia coli - MIC*    AMPICILLIN <=2 SENSITIVE Sensitive     CEFAZOLIN <=4 SENSITIVE Sensitive     CEFTRIAXONE <=1 SENSITIVE Sensitive     CIPROFLOXACIN <=0.25 SENSITIVE Sensitive     GENTAMICIN <=1 SENSITIVE Sensitive     LEVOFLOXACIN <=0.12 SENSITIVE Sensitive     NITROFURANTOIN <=16 SENSITIVE Sensitive     TOBRAMYCIN <=1 SENSITIVE Sensitive     TRIMETH/SULFA >=320 RESISTANT Resistant     PIP/TAZO <=4 SENSITIVE Sensitive     * ESCHERICHIA COLI     Studies: Mr Lumbar Spine Wo Contrast  02/18/2014   CLINICAL DATA:  Back pain after a mechanical fall 02/12/2014.  EXAM: MRI LUMBAR SPINE WITHOUT CONTRAST  TECHNIQUE: Multiplanar, multisequence MR imaging of the lumbar spine was performed. No intravenous contrast was administered.  COMPARISON:  Lumbar spine radiographs 02/13/2014.  FINDINGS: There is significant progression of a severe compression fracture is present at L2. Retropulsed bone extends 7.5 mm into the canal. There is severe central canal stenosis at the L2 level as a result. Mild to moderate foraminal narrowing is present bilaterally at L1-2 and L2-3. Vertebral body heights are otherwise maintained. There is a superior endplate Schmorl's node at L4. Slight anterolisthesis is present at L4-5.  A right renal cyst is present. A 3 cm septated right adnexal cyst is noted. Limited imaging of the abdomen is otherwise unremarkable. There is no significant adenopathy.  L3-4: A broad-based disc protrusion and mild facet hypertrophy bilaterally results in mild left subarticular and bilateral foraminal narrowing.  L4-5: Anterolisthesis results in uncovering of a broad-based disc protrusion. Moderate facet hypertrophy is present. Moderate subarticular stenosis is worse on the right. Mild foraminal narrowing is also worse on the right.  L5-S1: There is chronic loss  of disc height without a significant stenosis.  IMPRESSION: 1. 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 2. Mild to moderate foraminal narrowing bilaterally at L1-2 and L2-3. 3. Mild left subarticular and bilateral foraminal narrowing at L3-4. 4. Grade 1 anterolisthesis with moderate serve radicular stenosis bilaterally at L4-5, worse on the right. 5. Mild foraminal narrowing at L4-5 is worse on the right. 6. 3 cm septated right adnexal cyst. This is stable dating back to a CT scan of 2012. These results will be called to the ordering clinician or representative by the Radiologist Assistant, and communication documented in the PACS or zVision Dashboard.   Electronically Signed   By: Gennette Pac M.D.   On: 02/18/2014 20:18   Mr Sacrum/si Joints Wo  Contrast  02/19/2014   CLINICAL DATA:  Status post fall 02/12/2014.  Back pain.  EXAM: MR SACRUM WITHOUT CONTRAST  TECHNIQUE: Multiplanar, multisequence MR imaging was performed. No intravenous contrast was administered.  COMPARISON:  None.  FINDINGS: There is no sacral fracture. Imaged pelvis and hips also demonstrate normal signal without fracture or worrisome lesion. The sacroiliac joints appear normal without effusion, erosion or notable degenerative change. Imaged intrapelvic contents show sigmoid diverticulosis.  IMPRESSION: Negative for fracture.  No acute abnormality.  Sigmoid diverticulosis.   Electronically Signed   By: Drusilla Kannerhomas  Dalessio M.D.   On: 02/19/2014 08:17    Scheduled Meds: . amLODipine  5 mg Oral Daily  . amphetamine-dextroamphetamine  5 mg Oral BID  . aspirin  81 mg Oral Daily  .  ceFAZolin (ANCEF) IV  2 g Intravenous On Call  . citalopram  20 mg Oral QHS  . famotidine  20 mg Oral BID  . heparin  5,000 Units Subcutaneous 3 times per day  . irbesartan  300 mg Oral Daily   And  . hydrochlorothiazide  12.5 mg Oral Daily  .  multivitamin with minerals  1 tablet Oral Daily  . oxybutynin  10 mg Oral QHS  . OxyCODONE  10 mg Oral Q12H  . pravastatin  40 mg Oral q1800  . senna-docusate  1 tablet Oral QHS   Continuous Infusions:    Time spent: > 35 minutes    Teylor Wolven  [facility] [email or web address], password [email or web address] 02/19/2014, 1:31 PM  LOS: 6 days

## 2014-02-19 NOTE — Progress Notes (Signed)
[facility] PROGRESS NOTE  Christine Pierce ZOX:[id number] DOB: [birth year] DOA: 02/13/2014 PCP: Kimber RelicGREEN, ARTHUR G, MD   80 year old female with history of hypertension, depression, obesity, hyperlipidemia, chronic back pain who presented after a fall and was found in the ER to have moderate L2 compression fracture  HPI/Subjective: Continues to have a significant amount of pain in her mid lower back despite increase in medications.  Assessment/Plan:  L2 compression fracture:  -new hospital problem - Pain medications changed to oxycodone/Percocet but patient as she continued to have significant amount of pain, added OxyContin 10 mg every 12 hours- pain better controlled today- she did not need Oxycodone through the night -Dr. Cena BentonVega discussed current issues with compression fracture and pain with Dr. August Saucerean who is the patient's orthopedist and it was recommended to obtain an IR consult if pain is significant -IR has evaluated the patient- MRI L spine reveals progression of L2 fx which is now sever with severe central canal stenosis at L2- patient still able to ambulate - Plan for discharge to SNF when stable   UTI: new hospital problem - Has received 4 days rocephin- discontinued on 12/16 - UC: > 100,000 cfu of e coli sensitive to Rocephin  COPD - stable, continue bronchodilators  CKD -BUN/ Cr resolved to normal   DM - Blood sugars well controlled  - Last A1c 6.2 on 11/16 -Not checking sugars while in the hospital  HTN -  amlodipine added 12/15- better controlled now  Dental disease? -Lucency noted posterior to the right maxillary teeth with periapical lucency on head CT   Code Status: full Family Communication: Discussed with patient  Disposition Plan: SNF-the patient was staying at one prior to being admitted to the hospital   Consultants:  IR  Procedures:  None  Antibiotics:  None     Objective: Filed Vitals:   02/19/14 0619  BP: 137/73  Pulse: 84  Temp:  98.1 F (36.7 C)  Resp: 17    Intake/Output Summary (Last 24 hours) at 02/19/14 1335 Last data filed at 02/18/14 1915  Gross per 24 hour  Intake    120 ml  Output      0 ml  Net    120 ml   Filed Weights   02/13/14 1726 02/13/14 2359  Weight: 72.576 kg (160 lb) 77.021 kg (169 lb 12.8 oz)    Exam:   General:  Pt in nad, alert and awake  Cardiovascular: Regular rate and rhythm, 2/6 murmur at right upper sternal border that does not radiate to the carotids  Respiratory: no wheezes, no increased wob, no crackles  Abdomen: soft, NT, ND, BS+   Musculoskeletal: no cyanosis or clubbing- tenderness in lower lumbar spine when palpated  Data Reviewed: Basic Metabolic Panel:  Recent Labs Lab 02/13/14 1805 02/19/14 0931  NA 140 135*  K 4.8 3.9  CL 104 94*  CO2 23 29  GLUCOSE 94 93  BUN 33* 20  CREATININE 1.34* 0.92  CALCIUM 10.1 9.8   Liver Function Tests: No results for input(s): AST, ALT, ALKPHOS, BILITOT, PROT, ALBUMIN in the last 168 hours. No results for input(s): LIPASE, AMYLASE in the last 168 hours. No results for input(s): AMMONIA in the last 168 hours. CBC:  Recent Labs Lab 02/13/14 1805 02/19/14 1218  WBC 10.7* 8.5  HGB 11.6* 12.1  HCT 35.0* 36.5  MCV 91.9 91.0  PLT 174 205   Cardiac Enzymes: No results for input(s): CKTOTAL, CKMB, CKMBINDEX, TROPONINI in the last 168 hours. BNP (  last 3 results)  Recent Labs  12/01/13 1652  PROBNP 187.0*   CBG: No results for input(s): GLUCAP in the last 168 hours.  Recent Results (from the past 240 hour(s))  Urine culture     Status: None   Collection Time: 02/13/14  8:40 PM  Result Value Ref Range Status   Specimen Description URINE, CLEAN CATCH  Final   Special Requests ADDED [id number]  Final   Culture  Setup Time   Final    02/14/2014 06:46 Performed at [facility]    Colony Count   Final    >=100,000 COLONIES/ML Performed at [facility]    Culture   Final    ESCHERICHIA  COLI Performed at [facility]    Report Status 02/17/2014 FINAL  Final   Organism ID, Bacteria ESCHERICHIA COLI  Final      Susceptibility   Escherichia coli - MIC*    AMPICILLIN <=2 SENSITIVE Sensitive     CEFAZOLIN <=4 SENSITIVE Sensitive     CEFTRIAXONE <=1 SENSITIVE Sensitive     CIPROFLOXACIN <=0.25 SENSITIVE Sensitive     GENTAMICIN <=1 SENSITIVE Sensitive     LEVOFLOXACIN <=0.12 SENSITIVE Sensitive     NITROFURANTOIN <=16 SENSITIVE Sensitive     TOBRAMYCIN <=1 SENSITIVE Sensitive     TRIMETH/SULFA >=320 RESISTANT Resistant     PIP/TAZO <=4 SENSITIVE Sensitive     * ESCHERICHIA COLI     Studies: Mr Lumbar Spine Wo Contrast  02/18/2014   CLINICAL DATA:  Back pain after a mechanical fall 02/12/2014.  EXAM: MRI LUMBAR SPINE WITHOUT CONTRAST  TECHNIQUE: Multiplanar, multisequence MR imaging of the lumbar spine was performed. No intravenous contrast was administered.  COMPARISON:  Lumbar spine radiographs 02/13/2014.  FINDINGS: There is significant progression of a severe compression fracture is present at L2. Retropulsed bone extends 7.5 mm into the canal. There is severe central canal stenosis at the L2 level as a result. Mild to moderate foraminal narrowing is present bilaterally at L1-2 and L2-3. Vertebral body heights are otherwise maintained. There is a superior endplate Schmorl's node at L4. Slight anterolisthesis is present at L4-5.  A right renal cyst is present. A 3 cm septated right adnexal cyst is noted. Limited imaging of the abdomen is otherwise unremarkable. There is no significant adenopathy.  L3-4: A broad-based disc protrusion and mild facet hypertrophy bilaterally results in mild left subarticular and bilateral foraminal narrowing.  L4-5: Anterolisthesis results in uncovering of a broad-based disc protrusion. Moderate facet hypertrophy is present. Moderate subarticular stenosis is worse on the right. Mild foraminal narrowing is also worse on the right.  L5-S1:  There is chronic loss of disc height without a significant stenosis.  IMPRESSION: 1. 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 2. Mild to moderate foraminal narrowing bilaterally at L1-2 and L2-3. 3. Mild left subarticular and bilateral foraminal narrowing at L3-4. 4. Grade 1 anterolisthesis with moderate serve radicular stenosis bilaterally at L4-5, worse on the right. 5. Mild foraminal narrowing at L4-5 is worse on the right. 6. 3 cm septated right adnexal cyst. This is stable dating back to a CT scan of 2012. These results will be called to the ordering clinician or representative by the Radiologist Assistant, and communication documented in the PACS or zVision Dashboard.   Electronically Signed   By: Gennette Pachris  Mattern M.D.   On: 02/18/2014 20:18  Mr Sacrum/si Joints Wo Contrast  02/19/2014   CLINICAL DATA:  Status post fall 02/12/2014.  Back pain.  EXAM: MR SACRUM WITHOUT CONTRAST  TECHNIQUE: Multiplanar, multisequence MR imaging was performed. No intravenous contrast was administered.  COMPARISON:  None.  FINDINGS: There is no sacral fracture. Imaged pelvis and hips also demonstrate normal signal without fracture or worrisome lesion. The sacroiliac joints appear normal without effusion, erosion or notable degenerative change. Imaged intrapelvic contents show sigmoid diverticulosis.  IMPRESSION: Negative for fracture.  No acute abnormality.  Sigmoid diverticulosis.   Electronically Signed   By: Drusilla Kanner M.D.   On: 02/19/2014 08:17    Scheduled Meds: . amLODipine  5 mg Oral Daily  . amphetamine-dextroamphetamine  5 mg Oral BID  . aspirin  81 mg Oral Daily  .  ceFAZolin (ANCEF) IV  2 g Intravenous On Call  . citalopram  20 mg Oral QHS  . famotidine  20 mg Oral BID  . heparin  5,000 Units Subcutaneous 3 times per day  . irbesartan  300 mg Oral Daily   And  . hydrochlorothiazide   12.5 mg Oral Daily  . multivitamin with minerals  1 tablet Oral Daily  . oxybutynin  10 mg Oral QHS  . OxyCODONE  10 mg Oral Q12H  . pravastatin  40 mg Oral q1800  . senna-docusate  1 tablet Oral QHS   Continuous Infusions:    Time spent: > 35 minutes    Graviela Nodal  [facility] [email or web address], password [facility] 02/19/2014, 1:35 PM  LOS: 6 days

## 2014-02-19 NOTE — Progress Notes (Signed)
Chaplain responded to spiritual care consult for pt requesting prayer. Pt has company and asked chaplain to come back later and to bring literature with her. Chaplain will try visit later this evening.   02/19/14 1500  Clinical Encounter Type  Visited With Patient  Visit Type Initial;Spiritual support  Referral From Nurse  [facility]  Spiritual Encounters  Spiritual Needs Literature  Jiles HaroldStamey, Georgana Romain F, Chaplain 02/19/2014 3:44 PM

## 2014-02-19 NOTE — Progress Notes (Signed)
Chaplain initiated follow up with pt. Pt was a Barrister's clerkvolunteer chaplain for 12 years [facility] and was a Engineer, civil (consulting)nurse for 50 years. Spirituality is very important for pt.  Chaplain facilitated storytelling, offered Bible and devotional, and offered prayer. Chaplain will follow as needed.    02/19/14 1700  Clinical Encounter Type  Visited With Patient  Visit Type Follow-up;Spiritual support  Spiritual Encounters  Spiritual Needs Literature;[facility] text;Prayer;Emotional  Stress Factors  Patient Stress Factors Health changes  Eleina Jergens, Loa SocksCourtney F, Chaplain 02/19/2014 5:39 PM

## 2014-02-19 NOTE — Consult Note (Signed)
Chief Complaint: Chief Complaint  Patient presents with  . Fall  L2 acute fx Back pain  Referring Physician(s): TRH  History of Present Illness: Christine Pierce is a 80 y.o. female   Pt suffered fall in room at [facility] 02/13/14 Crawled to bed and pulled up Now with back pain not responsive to Tylenol Barely responding now to Oxycontin and Percocet TRH has asked for consult for Lumbar 2 vertebroplasty/kyphoplasty Dr Corliss Skains reviewed Lumbar plain films with + L2 fx Recommended MRI- this was performed 12/17 Report does confirm L2 acute fx with retropulsion Dr Corliss Skains has reviewed imaging and feels pt is appropriate for verterbroplasty I have seen and examined pt   Past Medical History  Diagnosis Date  . Hypertension 10/23/2011  . Shortness of breath 04/30/2012  . Other nonspecific abnormal finding of lung field   . Contact with or exposure to unspecified communicable disease   . Cystocele, midline 04/10/2012  . Cough 04/10/2012  . Full incontinence of feces 04/10/2012  . Urgency of urination 04/10/2012  . Abdominal pain, other specified site   . Orthopnea 12/01/2011  . Type II or unspecified type diabetes mellitus without mention of complication, not stated as uncontrolled 11/16/2009  . Other and unspecified hyperlipidemia 10/20/2011  . Urinary tract infection, site not specified 09/11/2011  . Muscle weakness (generalized) 09/11/2011  . Syncope and collapse 03/29/2011  . Pain in joint, ankle and foot 12/27/2010  . Sprain of ribs 08/15/2010  . Unspecified tinnitus 07/04/2010  . Pneumonia, organism unspecified 05/04/2010  . Diverticulosis of colon (without mention of hemorrhage)   . Other and unspecified ovarian cyst 05/04/2010  . Abdominal pain, right lower quadrant 05/02/2010  . Anxiety state, unspecified 02/14/2010  . Shortness of breath 01/05/2010  . Nausea alone 12/13/2009  . Dehydration   . Pain in joint, shoulder region 11/15/2009  . Other abnormal blood  chemistry   . Pain in joint, pelvic region and thigh 07/29/2009  . Personal history of fall 07/29/2009  . Other malaise and fatigue 02/15/2009  . Obesity, unspecified 07/16/2008  . Unspecified hereditary and idiopathic peripheral neuropathy   . Reflux esophagitis 10/18/2007  . Unspecified vitamin D deficiency 07/19/2007  . Chest pain, unspecified 07/19/2007  . Varicose veins of lower extremities with inflammation 04/25/2006  . Restless legs syndrome (RLS) 01/16/2006  . Nocturia 07/07/2005  . Encounter for long-term (current) use of other medications   . Cervicalgia 01/20/2004  . Osteoporosis, unspecified 01/23/2003  . Sciatica 04/03/2001  . Routine general medical examination at a health care facility 11/15/2009  . Pain in joint, lower leg 12/11/2001  . Lumbago 02/20/2001  . Hard of hearing     both ears  . Renal disorder     last saw dr deterding 2 months ago, all new meds must be oked thru dr deterding  . Chronic kidney disease, stage III (moderate) 12/15/09  . Acute kidney failure, unspecified 12/10/2009  . Hydronephrosis 07/16/2008  . Cold   . Hypertension   . Diabetes     MANAGED WITH DIET AND EXERCISE  . Anxiety   . High cholesterol   . Depression     Past Surgical History  Procedure Laterality Date  . Appendectomy    . Stapedectomy Bilateral [phone]    middle ear surgery  . Eye surgery Left 1988    cataract w IOL implant, Dr. Vic Ripper  . Fracture surgery Right 09/2004    distal radius/Dr. Teressa Senter  . Tonsillectomy and adenoidectomy  age 67  .  Esophagogastroduodenoscopy (egd) with propofol N/A 09/03/2013    Procedure: ESOPHAGOGASTRODUODENOSCOPY (EGD) WITH PROPOFOL;  Surgeon: Willis Modena, MD;  Location: [facility];  Service: Endoscopy;  Laterality: N/A;  . Botox injection N/A 09/03/2013    Procedure: BOTOX INJECTION;  Surgeon: Willis Modena, MD;  Location: [facility];  Service: Endoscopy;  Laterality: N/A;    Allergies: Macrodantin  Medications: Prior to  Admission medications   Medication Sig Start Date End Date Taking? Authorizing Provider  acetaminophen (TYLENOL) 500 MG tablet Take 500 mg by mouth every 4 (four) hours as needed (pain).    Yes Historical Provider, MD  albuterol (PROVENTIL HFA;VENTOLIN HFA) 108 (90 BASE) MCG/ACT inhaler Inhale 2 puffs into the lungs every 6 (six) hours as needed for wheezing. 12/31/12  Yes Kimber Relic, MD  ALPRAZolam Prudy Feeler) 0.5 MG tablet Take 1 tablet (0.5 mg total) by mouth 2 (two) times daily as needed for anxiety. 09/04/13  Yes Sharon Seller, NP  amphetamine-dextroamphetamine (ADDERALL) 5 MG tablet Take one tablet by mouth twice daily for ADHD Patient taking differently: Take 5 mg by mouth 2 (two) times daily.  02/11/14  Yes Roena Malady, PA-C  aspirin 81 MG chewable tablet Chew 81 mg by mouth daily.   Yes Historical Provider, MD  citalopram (CELEXA) 20 MG tablet Take 1 tablet (20 mg total) by mouth daily. Patient taking differently: Take 20 mg by mouth at bedtime.  10/21/13  Yes Kimber Relic, MD  dextromethorphan (DELSYM) 30 MG/5ML liquid 10 cc every 12 hours to suppress cough Patient taking differently: Take 60 mg by mouth every 12 (twelve) hours.  01/27/14  Yes Kimber Relic, MD  dextromethorphan-guaiFENesin [facility] DM) 30-600 MG per 12 hr tablet Take 1 tablet by mouth every 12 (twelve) hours as needed for cough (congestion).   Yes Historical Provider, MD  famotidine (PEPCID) 20 MG tablet One twice daily to suppress stomach acid Patient taking differently: Take 20 mg by mouth 2 (two) times daily.  01/27/14  Yes Kimber Relic, MD  hydrocortisone cream 1 % Apply to pruritic or irritated areas of legs twice daily as needed Patient taking differently: Apply 1 application topically 2 (two) times daily as needed for itching. Apply to pruritic or irritated areas of legs twice daily as needed 07/30/13  Yes Kimber Relic, MD  lovastatin (MEVACOR) 40 MG tablet Take 40 mg by mouth every morning.   Yes  Historical Provider, MD  Melatonin 3 MG CAPS Take 3 mg by mouth at bedtime as needed (Sleep).   Yes Historical Provider, MD  Multiple Vitamin (MULTIVITAMIN WITH MINERALS) TABS tablet Take 1 tablet by mouth daily.   Yes Historical Provider, MD  nitroGLYCERIN (NITROSTAT) 0.4 MG SL tablet Place 0.4 mg under the tongue every 5 (five) minutes as needed for chest pain.   Yes Historical Provider, MD  omeprazole (PRILOSEC) 40 MG capsule Take 40 mg by mouth 2 (two) times daily.   Yes Historical Provider, MD  oxybutynin (DITROPAN-XL) 10 MG 24 hr tablet Take 10 mg by mouth at bedtime.   Yes Historical Provider, MD  senna-docusate (SENOKOT-S) 8.6-50 MG per tablet One nightly to prevent constipation Patient taking differently: Take 1 tablet by mouth at bedtime.  10/21/13  Yes Kimber Relic, MD  telmisartan-hydrochlorothiazide (MICARDIS HCT) 80-12.5 MG per tablet Take 1 tablet by mouth daily. 09/17/13  Yes Nyoka Cowden, MD  trolamine salicylate (ASPERCREME) 10 % cream Apply 1 application topically 3 (three) times daily. Right grater trochanteric bursa area  Yes Historical Provider, MD  amphetamine-dextroamphetamine (ADDERALL) 5 MG tablet Take one tablet by mouth twice daily for ADHD Patient not taking: Reported on 02/13/2014 02/11/14   Roena Malady, PA-C  Misc. Devices (ROLLER WALKER) MISC Rolling Walker with seat and brakes. 11/18/12   Kimber Relic, MD    Family History  Problem Relation Age of Onset  . Cancer Mother     abdomen  . Cancer Father     brain, lung  . Heart disease Son     History   Social History  . Marital Status: Widowed    Spouse Name: N/A    Number of Children: N/A  . Years of Education: N/A   Social History Main Topics  . Smoking status: Never Smoker   . Smokeless tobacco: Never Used  . Alcohol Use: No  . Drug Use: No  . Sexual Activity: None   Other Topics Concern  . None   Social History Narrative     Review of Systems: A 12 point ROS discussed and pertinent  positives are indicated in the HPI above.  All other systems are negative.  Review of Systems  Constitutional: Positive for activity change, appetite change and fatigue.  Respiratory: Positive for shortness of breath. Negative for cough, chest tightness and wheezing.   Cardiovascular: Negative for chest pain.  Gastrointestinal: Negative for abdominal pain.  Genitourinary: Negative for difficulty urinating.  Musculoskeletal: Positive for back pain and gait problem.  Neurological: Positive for weakness.  Psychiatric/Behavioral: Negative for behavioral problems and confusion.    Vital Signs: BP 137/73 mmHg  Pulse 84  Temp(Src) 98.1 F (36.7 C) (Oral)  Resp 17  Ht 5\' 5"  (1.651 m)  Wt 77.021 kg (169 lb 12.8 oz)  BMI 28.26 kg/m2  SpO2 96%  Physical Exam  Constitutional: She is oriented to person, place, and time. She appears well-nourished.  Cardiovascular: Normal rate and regular rhythm.   No murmur heard. Pulmonary/Chest: Breath sounds normal. She has no wheezes.  Abdominal: Soft. Bowel sounds are normal. There is no tenderness.  Musculoskeletal: Normal range of motion.  Moves all 4s Severe back pain with movement  Neurological: She is alert and oriented to person, place, and time.  Skin: Skin is warm and dry.  Psychiatric: She has a normal mood and affect. Her behavior is normal. Judgment and thought content normal.    Imaging: Dg Chest 2 View  02/13/2014   CLINICAL DATA:  80 year old female status post fall  EXAM: CHEST  2 VIEW  COMPARISON:  Prior chest x-ray 09/17/2013  FINDINGS: Stable cardiac and mediastinal contours. Atherosclerotic calcifications present in the aorta. No evidence of a pneumothorax or pleural effusion. Stable central bronchitic change of a mild severity. And upper lumbar compression fracture is incompletely imaged. This represents a new finding compared to 09/10/2012. However, the appearance is suggestive of a chronic injury.  IMPRESSION: 1. No active  cardiopulmonary disease. 2. Incompletely imaged upper lumbar compression fracture is an interval finding compared to the prior chest CT from 09/10/2012. However, the appearance suggests a remote injury. 3. Aortic atherosclerosis.   Electronically Signed   By: Malachy Moan M.D.   On: 02/13/2014 20:29   Dg Lumbar Spine Complete  02/13/2014   CLINICAL DATA:  Fall with low back pain.  EXAM: LUMBAR SPINE - COMPLETE 4+ VIEW  COMPARISON:  05/02/2010 and 09/22/2008 as well as CT 05/02/2010  FINDINGS: Exam demonstrates diffuse decreased bone mineralization. Exam demonstrates mild spondylosis of the lumbar spine to include facet  arthropathy. There is a moderate compression fracture of L2 new since 2012 and likely an acute injury. There is prominent deformity of the anterior superior aspect of the vertebral body. No retropulsion component. There is mild grade 1 anterolisthesis of L4 on L5 unchanged. Disc space narrowing at the L5-S1 level unchanged. T11-12 calcified disc unchanged. Subtle lucency along the superior border of the left sacral canal as cannot completely exclude a fracture in this location.  IMPRESSION: Moderate L2 compression fracture likely acute.  Subtle lucency over the superior border of the left sacral L eft as cannot exclude a fracture.  Mild spondylosis of the lumbar spine with disc disease at the L5-S1 level.  Stable grade 1 anterolisthesis of L4 on L5.   Electronically Signed   By: Elberta Fortisaniel  Boyle M.D.   On: 02/13/2014 20:30   Dg Hip Complete Left  02/15/2014   CLINICAL DATA:  Initial evaluation for left hip pain, fell 2 days ago  EXAM: LEFT HIP - COMPLETE 2+ VIEW  COMPARISON:  None.  FINDINGS: Mild to moderate bilateral hip arthritis. No femur fracture. Pelvic bones intact. Pubic symphysitis. Bilateral sacroiliitis.  IMPRESSION: No acute findings.   Electronically Signed   By: Esperanza Heiraymond  Rubner M.D.   On: 02/15/2014 19:43   Ct Head Wo Contrast  02/13/2014   CLINICAL DATA:  80 year old who  fell while at the nursing home earlier today.  EXAM: CT HEAD WITHOUT CONTRAST  CT CERVICAL SPINE WITHOUT CONTRAST  TECHNIQUE: Multidetector CT imaging of the head and cervical spine was performed following the standard protocol without intravenous contrast. Multiplanar CT image reconstructions of the cervical spine were also generated.  COMPARISON:  CT head and cervical spine 04/11/2012.  FINDINGS: CT HEAD FINDINGS  Moderate to severe age related cortical and deep atrophy, unchanged. Moderate changes of small vessel disease of the cerebral white matter, unchanged. No mass lesion. No midline shift. No acute hemorrhage or hematoma. No extra-axial fluid collections. No evidence of acute infarction. No significant interval change.  No skull fracture or other focal osseous abnormality involving the skull. Mucosal thickening involving the right maxillary sinus. Remaining visualized paranasal sinuses, bilateral mastoid air cells and bilateral middle ear cavities well aerated. Lucency involving the roots of the 3 most posterior right maxillary teeth; this area was not imaged on the prior examination.  CT CERVICAL SPINE FINDINGS  No fractures identified involving the cervical spine. Sagittal reconstructed images demonstrate anatomic alignment and exaggeration of the usual cervical lordosis. Facet joints intact throughout with severe degenerative changes and fusion of the left facets at C3 and C4. Borderline multifactorial spinal stenosis at C3-4. Chronic central disc protrusion at C3-4 with calcification in the posterior annular fibers. Vacuum disc phenomenon at C5-6. Coronal reformatted images demonstrate an intact craniocervical junction, intact dens, and intact lateral masses throughout. Severe degenerative changes at the C1-C2 articulation.  IMPRESSION: 1. No acute intracranial abnormality. 2. Stable age related moderate to severe cortical atrophy and mild chronic microvascular ischemic changes of the white matter. 3. No  cervical spine fractures identified. 4. Multilevel degenerative disc disease, spondylosis and facet degenerative changes throughout the cervical spine with borderline multifactorial spinal stenosis at C3-4. 5. Dental disease involving the posterior right maxillary teeth with periapical lucency.   Electronically Signed   By: Hulan Saashomas  Lawrence M.D.   On: 02/13/2014 19:35   Ct Cervical Spine Wo Contrast  02/13/2014   CLINICAL DATA:  80 year old who fell while at the nursing home earlier today.  EXAM: CT HEAD WITHOUT CONTRAST  CT CERVICAL SPINE WITHOUT CONTRAST  TECHNIQUE: Multidetector CT imaging of the head and cervical spine was performed following the standard protocol without intravenous contrast. Multiplanar CT image reconstructions of the cervical spine were also generated.  COMPARISON:  CT head and cervical spine 04/11/2012.  FINDINGS: CT HEAD FINDINGS  Moderate to severe age related cortical and deep atrophy, unchanged. Moderate changes of small vessel disease of the cerebral white matter, unchanged. No mass lesion. No midline shift. No acute hemorrhage or hematoma. No extra-axial fluid collections. No evidence of acute infarction. No significant interval change.  No skull fracture or other focal osseous abnormality involving the skull. Mucosal thickening involving the right maxillary sinus. Remaining visualized paranasal sinuses, bilateral mastoid air cells and bilateral middle ear cavities well aerated. Lucency involving the roots of the 3 most posterior right maxillary teeth; this area was not imaged on the prior examination.  CT CERVICAL SPINE FINDINGS  No fractures identified involving the cervical spine. Sagittal reconstructed images demonstrate anatomic alignment and exaggeration of the usual cervical lordosis. Facet joints intact throughout with severe degenerative changes and fusion of the left facets at C3 and C4. Borderline multifactorial spinal stenosis at C3-4. Chronic central disc protrusion at  C3-4 with calcification in the posterior annular fibers. Vacuum disc phenomenon at C5-6. Coronal reformatted images demonstrate an intact craniocervical junction, intact dens, and intact lateral masses throughout. Severe degenerative changes at the C1-C2 articulation.  IMPRESSION: 1. No acute intracranial abnormality. 2. Stable age related moderate to severe cortical atrophy and mild chronic microvascular ischemic changes of the white matter. 3. No cervical spine fractures identified. 4. Multilevel degenerative disc disease, spondylosis and facet degenerative changes throughout the cervical spine with borderline multifactorial spinal stenosis at C3-4. 5. Dental disease involving the posterior right maxillary teeth with periapical lucency.   Electronically Signed   By: Hulan Saas M.D.   On: 02/13/2014 19:35   Mr Lumbar Spine Wo Contrast  02/18/2014   CLINICAL DATA:  Back pain after a mechanical fall 02/12/2014.  EXAM: MRI LUMBAR SPINE WITHOUT CONTRAST  TECHNIQUE: Multiplanar, multisequence MR imaging of the lumbar spine was performed. No intravenous contrast was administered.  COMPARISON:  Lumbar spine radiographs 02/13/2014.  FINDINGS: There is significant progression of a severe compression fracture is present at L2. Retropulsed bone extends 7.5 mm into the canal. There is severe central canal stenosis at the L2 level as a result. Mild to moderate foraminal narrowing is present bilaterally at L1-2 and L2-3. Vertebral body heights are otherwise maintained. There is a superior endplate Schmorl's node at L4. Slight anterolisthesis is present at L4-5.  A right renal cyst is present. A 3 cm septated right adnexal cyst is noted. Limited imaging of the abdomen is otherwise unremarkable. There is no significant adenopathy.  L3-4: A broad-based disc protrusion and mild facet hypertrophy bilaterally results in mild left subarticular and bilateral foraminal narrowing.  L4-5: Anterolisthesis results in uncovering of  a broad-based disc protrusion. Moderate facet hypertrophy is present. Moderate subarticular stenosis is worse on the right. Mild foraminal narrowing is also worse on the right.  L5-S1: There is chronic loss of disc height without a significant stenosis.  IMPRESSION: 1. 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 2. Mild to moderate foraminal narrowing bilaterally at L1-2 and L2-3. 3. Mild left subarticular and bilateral foraminal narrowing at L3-4. 4. Grade 1 anterolisthesis with moderate serve radicular stenosis bilaterally at  L4-5, worse on the right. 5. Mild foraminal narrowing at L4-5 is worse on the right. 6. 3 cm septated right adnexal cyst. This is stable dating back to a CT scan of 2012. These results will be called to the ordering clinician or representative by the Radiologist Assistant, and communication documented in the PACS or zVision Dashboard.   Electronically Signed   By: Gennette Pachris  Mattern M.D.   On: 02/18/2014 20:18   Mr Sacrum/si Joints Wo Contrast  02/19/2014   CLINICAL DATA:  Status post fall 02/12/2014.  Back pain.  EXAM: MR SACRUM WITHOUT CONTRAST  TECHNIQUE: Multiplanar, multisequence MR imaging was performed. No intravenous contrast was administered.  COMPARISON:  None.  FINDINGS: There is no sacral fracture. Imaged pelvis and hips also demonstrate normal signal without fracture or worrisome lesion. The sacroiliac joints appear normal without effusion, erosion or notable degenerative change. Imaged intrapelvic contents show sigmoid diverticulosis.  IMPRESSION: Negative for fracture.  No acute abnormality.  Sigmoid diverticulosis.   Electronically Signed   By: Drusilla Kannerhomas  Dalessio M.D.   On: 02/19/2014 08:17   Dg Humerus Right  02/13/2014   CLINICAL DATA:  Fall. Right upper arm pain. History of osteoporosis.  EXAM: RIGHT HUMERUS - 2+ VIEW  COMPARISON:  10/10/2006  FINDINGS: No  fracture observed. The lateral projection raises the possibility of an elbow joint effusion although this is equivocal. If the patient has symptoms in the vicinity of the elbow, consider dedicated elbow 0 radiography.  IMPRESSION: 1. No acute bony findings. Questionable elbow joint effusion. Correlate with any symptoms in the vicinity of the elbow in determining whether dedicated elbow radiography is warranted.   Electronically Signed   By: Herbie BaltimoreWalt  Liebkemann M.D.   On: 02/13/2014 20:28    Labs:  CBC:  Recent Labs  03/26/13 1706 12/01/13 1652 02/13/14 1805  WBC 4.4 6.4 10.7*  HGB 11.3 12.0 11.6*  HCT 34.4 35.7* 35.0*  PLT 256 231.0 174    COAGS: No results for input(s): INR, APTT in the last 8760 hours.  BMP:  Recent Labs  03/26/13 1706 07/18/13 1041 12/01/13 1652 01/19/14 0947 02/13/14 1805  NA 142 144 135 139 140  K 4.9 4.8 4.4 5.2 4.8  CL 104 102 102 102 104  CO2 22 20 25 22 23   GLUCOSE 99 96 100* 97 94  BUN 29* 28* 34* 28 33*  CALCIUM 9.8 10.1 9.6 9.8 10.1  CREATININE 1.31* 1.14* 1.4* 1.32* 1.34*  GFRNONAA 36* 43*  --  36* 34*  GFRAA 42* 49*  --  41* 39*    LIVER FUNCTION TESTS:  Recent Labs  03/26/13 1706 07/18/13 1041 01/19/14 0947  BILITOT 0.2 0.5 0.3  AST 17 17 19   ALT 8 7 9   ALKPHOS 73 73 60  PROT 6.1 6.4 6.3    TUMOR MARKERS: No results for input(s): AFPTM, CEA, CA199, CHROMGRNA in the last 8760 hours.  Assessment and Plan:  Severe back pain L2 fracture per MRI Plan for L2 VP 12/18 per Dr Corliss Skainseveshwar I have discussed at length this procedure with pt and care giver- [facility](HCPOA) They have good understanding of procedure benefits and risks  Consent signed andin chart  Thank you for this interesting consult.  I greatly enjoyed meeting Christine Pierce and look forward to participating in their care.     I spent a total of 40 minutes face to face in clinical consultation, greater than 50% of which was counseling/coordinating care for L2  VP/KP  Signed: TURPIN,PAMELA A  02/19/2014, 10:07 AM

## 2014-02-20 ENCOUNTER — Inpatient Hospital Stay (HOSPITAL_COMMUNITY): Payer: Medicare Other

## 2014-02-20 DIAGNOSIS — M549 Dorsalgia, unspecified: Secondary | ICD-10-CM

## 2014-02-20 MED ORDER — FENTANYL CITRATE 0.05 MG/ML IJ SOLN
INTRAMUSCULAR | Status: AC | PRN
  Administered 2014-02-20 (×3): 12.5 ug via INTRAVENOUS
  Administered 2014-02-20 (×2): 25 ug via INTRAVENOUS

## 2014-02-20 MED ORDER — CEFAZOLIN SODIUM-DEXTROSE 2-3 GM-% IV SOLR
INTRAVENOUS | Status: AC
  Filled 2014-02-20: qty 50

## 2014-02-20 MED ORDER — IOHEXOL 300 MG/ML  SOLN
50.0000 mL | Freq: Once | INTRAMUSCULAR | Status: AC | PRN
  Administered 2014-02-20: 5 mL via INTRAVENOUS

## 2014-02-20 MED ORDER — AMOXICILLIN 500 MG PO CAPS
500.0000 mg | ORAL_CAPSULE | Freq: Three times a day (TID) | ORAL | Status: DC
  Administered 2014-02-20 – 2014-02-21 (×4): 500 mg via ORAL
  Filled 2014-02-20 (×6): qty 1

## 2014-02-20 MED ORDER — MIDAZOLAM HCL 2 MG/2ML IJ SOLN
INTRAMUSCULAR | Status: AC
  Filled 2014-02-20: qty 4

## 2014-02-20 MED ORDER — MIDAZOLAM HCL 2 MG/2ML IJ SOLN
INTRAMUSCULAR | Status: AC | PRN
  Administered 2014-02-20: 1 mg via INTRAVENOUS
  Administered 2014-02-20: 0.5 mg via INTRAVENOUS
  Administered 2014-02-20: 1 mg via INTRAVENOUS

## 2014-02-20 MED ORDER — FENTANYL CITRATE 0.05 MG/ML IJ SOLN
INTRAMUSCULAR | Status: AC
  Filled 2014-02-20: qty 4

## 2014-02-20 MED ORDER — HYDROMORPHONE HCL 1 MG/ML IJ SOLN
INTRAMUSCULAR | Status: AC
  Filled 2014-02-20: qty 2

## 2014-02-20 MED ORDER — TOBRAMYCIN SULFATE 1.2 G IJ SOLR
INTRAMUSCULAR | Status: AC
  Filled 2014-02-20: qty 1.2

## 2014-02-20 MED ORDER — HYDRALAZINE HCL 20 MG/ML IJ SOLN
INTRAMUSCULAR | Status: AC
  Filled 2014-02-20: qty 1

## 2014-02-20 MED ORDER — BUPIVACAINE HCL (PF) 0.25 % IJ SOLN
INTRAMUSCULAR | Status: AC
  Filled 2014-02-20: qty 30

## 2014-02-20 MED ORDER — SODIUM CHLORIDE 0.9 % IV SOLN: INTRAVENOUS | Status: AC

## 2014-02-20 NOTE — Sedation Documentation (Signed)
Pt saying ouch w/ hammering, still denies pain radiating down leg.

## 2014-02-20 NOTE — Clinical Social Work Note (Signed)
CSW continuing to follow.  Patient to be discharged to [facility] once medically appropriate.  Vickii PennaGina Sloan Galentine, LCSWA [phone]  Psychiatric & Orthopedics (5N 1-16) Clinical Social Worker

## 2014-02-20 NOTE — Discharge Instructions (Signed)
No stooping,bending or lifting more than 10 lbs for 2 weeks. 2.Use walker to ambulate for 2 weeks. 3.RTC in 2 weeks

## 2014-02-20 NOTE — Sedation Documentation (Signed)
Pt noted to grimace occasionally w/ hammering, but eyes remain closed and pt is not complaining of any pain. VSS.

## 2014-02-20 NOTE — Sedation Documentation (Signed)
Patient denies pain and is resting comfortably.  

## 2014-02-20 NOTE — Progress Notes (Signed)
Patient unable to void since early this AM. Patient stating she feels fullness in her bladder and is unable to initiate a void.  Bladder scan revealed 512cc of urine in bladder. In and out cath order previously placed, will follow order to cath as needed. Will continue to monitor for spontaneous void.

## 2014-02-20 NOTE — Sedation Documentation (Signed)
Patient is resting comfortably. 

## 2014-02-20 NOTE — Sedation Documentation (Signed)
Pt denied having pain down her leg right now.

## 2014-02-20 NOTE — Progress Notes (Signed)
[facility] PROGRESS NOTE  Wells GuilesMary J Korber ZOX:[id number] DOB: Apr 04, 1921 DOA: 02/13/2014 PCP: Kimber RelicGREEN, ARTHUR G, MD   80 year old female with history of hypertension, depression, obesity, hyperlipidemia, chronic back pain who presented after a fall and was found in the ER to have moderate L2 compression fracture  HPI/Subjective: Having severe back pain - has not received pain medication since 6 PM yesterday  Assessment/Plan:  L2 compression fracture:  -new hospital problem - Pain medications changed to oxycodone/Percocet but patient as she continued to have significant amount of pain, added OxyContin 10 mg every 12 hours -Dr. Cena BentonVega discussed current issues with compression fracture and pain with Dr. August Saucerean who is the patient's orthopedist and it was recommended to obtain an IR consult if pain is significant -IR has evaluated the patient- MRI L spine reveals progression of L2 fx which is now sever with severe central canal stenosis at L2- patient still able to ambulate - plan for kyphoplasty today - Plan for discharge to SNF when stable   UTI: new hospital problem - UC: > 100,000 cfu of e coli  - Has received 4 days rocephin- discontinued on 12/16 but repeat UA today still positive- will start ampicillin  COPD - stable, continue bronchodilators  CKD -BUN/ Cr resolved to normal   DM - Blood sugars well controlled per Bmets - Last A1c 6.2 on 11/16 -Not checking sugars while in the hospital  HTN -  amlodipine added 12/15-  BP noted to be uncontrolled when pain is severe  Dental disease? -Lucency noted posterior to the right maxillary teeth with periapical lucency on head CT   Code Status: full Family Communication: Discussed with patient and POA today Disposition Plan: SNF    Consultants:  IR  Procedures:  None  Antibiotics:  None     Objective: Filed Vitals:   02/20/14 1030  BP:   Pulse: 95  Temp:   Resp: 13   No intake or output data in the 24 hours  ending 02/20/14 1346 Filed Weights   02/13/14 1726 02/13/14 2359  Weight: 72.576 kg (160 lb) 77.021 kg (169 lb 12.8 oz)    Exam:   General:  Pt in nad, alert and awake- in moderate distress due to pain  Cardiovascular: Regular rate and rhythm, 2/6 murmur at right upper sternal border that does not radiate to the carotids  Respiratory: no wheezes, no increased wob, no crackles  Abdomen: soft, NT, ND, BS+   Musculoskeletal: no cyanosis or clubbing- tenderness in lower lumbar spine when palpated  Data Reviewed: Basic Metabolic Panel:  Recent Labs Lab 02/13/14 1805 02/19/14 0931  NA 140 135*  K 4.8 3.9  CL 104 94*  CO2 23 29  GLUCOSE 94 93  BUN 33* 20  CREATININE 1.34* 0.92  CALCIUM 10.1 9.8   Liver Function Tests: No results for input(s): AST, ALT, ALKPHOS, BILITOT, PROT, ALBUMIN in the last 168 hours. No results for input(s): LIPASE, AMYLASE in the last 168 hours. No results for input(s): AMMONIA in the last 168 hours. CBC:  Recent Labs Lab 02/13/14 1805 02/19/14 1218  WBC 10.7* 8.5  HGB 11.6* 12.1  HCT 35.0* 36.5  MCV 91.9 91.0  PLT 174 205   Cardiac Enzymes: No results for input(s): CKTOTAL, CKMB, CKMBINDEX, TROPONINI in the last 168 hours. BNP (last 3 results)  Recent Labs  12/01/13 1652  PROBNP 187.0*   CBG: No results for input(s): GLUCAP in the last 168 hours.  Recent Results (from the past 240 hour(s))  Urine culture     Status: None   Collection Time: 02/13/14  8:40 PM  Result Value Ref Range Status   Specimen Description URINE, CLEAN CATCH  Final   Special Requests ADDED [id number]  Final   Culture  Setup Time   Final    02/14/2014 06:46 Performed at [facility]    Colony Count   Final    >=100,000 COLONIES/ML Performed at [facility]    Culture   Final    ESCHERICHIA COLI Performed at [facility]    Report Status 02/17/2014 FINAL  Final   Organism ID, Bacteria ESCHERICHIA COLI  Final       Susceptibility   Escherichia coli - MIC*    AMPICILLIN <=2 SENSITIVE Sensitive     CEFAZOLIN <=4 SENSITIVE Sensitive     CEFTRIAXONE <=1 SENSITIVE Sensitive     CIPROFLOXACIN <=0.25 SENSITIVE Sensitive     GENTAMICIN <=1 SENSITIVE Sensitive     LEVOFLOXACIN <=0.12 SENSITIVE Sensitive     NITROFURANTOIN <=16 SENSITIVE Sensitive     TOBRAMYCIN <=1 SENSITIVE Sensitive     TRIMETH/SULFA >=320 RESISTANT Resistant     PIP/TAZO <=4 SENSITIVE Sensitive     * ESCHERICHIA COLI     Studies: Mr Lumbar Spine Wo Contrast  02/18/2014   CLINICAL DATA:  Back pain after a mechanical fall 02/12/2014.  EXAM: MRI LUMBAR SPINE WITHOUT CONTRAST  TECHNIQUE: Multiplanar, multisequence MR imaging of the lumbar spine was performed. No intravenous contrast was administered.  COMPARISON:  Lumbar spine radiographs 02/13/2014.  FINDINGS: There is significant progression of a severe compression fracture is present at L2. Retropulsed bone extends 7.5 mm into the canal. There is severe central canal stenosis at the L2 level as a result. Mild to moderate foraminal narrowing is present bilaterally at L1-2 and L2-3. Vertebral body heights are otherwise maintained. There is a superior endplate Schmorl's node at L4. Slight anterolisthesis is present at L4-5.  A right renal cyst is present. A 3 cm septated right adnexal cyst is noted. Limited imaging of the abdomen is otherwise unremarkable. There is no significant adenopathy.  L3-4: A broad-based disc protrusion and mild facet hypertrophy bilaterally results in mild left subarticular and bilateral foraminal narrowing.  L4-5: Anterolisthesis results in uncovering of a broad-based disc protrusion. Moderate facet hypertrophy is present. Moderate subarticular stenosis is worse on the right. Mild foraminal narrowing is also worse on the right.  L5-S1: There is chronic loss of disc height without a significant stenosis.  IMPRESSION: 1. Progression of a now severe osteoporotic compression  fracture at L2 with significant retropulsion of bone and severe central canal stenosis at the L2 level. 2. Mild to moderate foraminal narrowing bilaterally at L1-2 and L2-3. 3. Mild left subarticular and bilateral foraminal narrowing at L3-4. 4. Grade 1 anterolisthesis with moderate serve radicular stenosis bilaterally at L4-5, worse on the right. 5. Mild foraminal narrowing at L4-5 is worse on the right. 6. 3 cm septated right adnexal cyst. This is stable dating back to a CT scan of 2012. These results will be called to the ordering clinician or representative by the Radiologist Assistant, and communication documented in the PACS or zVision Dashboard.   Electronically Signed   By: Gennette Pac M.D.   On: 02/18/2014 20:18   Mr Sacrum/si Joints Wo Contrast  02/19/2014   CLINICAL DATA:  Status post fall 02/12/2014.  Back pain.  EXAM: MR SACRUM WITHOUT CONTRAST  TECHNIQUE: Multiplanar, multisequence MR imaging was performed. No  intravenous contrast was administered.  COMPARISON:  None.  FINDINGS: There is no sacral fracture. Imaged pelvis and hips also demonstrate normal signal without fracture or worrisome lesion. The sacroiliac joints appear normal without effusion, erosion or notable degenerative change. Imaged intrapelvic contents show sigmoid diverticulosis.  IMPRESSION: Negative for fracture.  No acute abnormality.  Sigmoid diverticulosis.   Electronically Signed   By: Drusilla Kannerhomas  Dalessio M.D.   On: 02/19/2014 08:17    Scheduled Meds: . amLODipine  5 mg Oral Daily  . amphetamine-dextroamphetamine  5 mg Oral BID  . aspirin  81 mg Oral Daily  . bupivacaine (PF)      . citalopram  20 mg Oral QHS  . famotidine  20 mg Oral BID  . fentaNYL      . heparin  5,000 Units Subcutaneous 3 times per day  . irbesartan  300 mg Oral Daily   And  . hydrochlorothiazide  12.5 mg Oral Daily  . midazolam      . multivitamin with minerals  1 tablet Oral Daily  . oxybutynin  10 mg Oral QHS  . OxyCODONE  10 mg Oral  Q12H  . pravastatin  40 mg Oral q1800  . senna-docusate  1 tablet Oral QHS  . tobramycin       Continuous Infusions: . sodium chloride       Time spent:  35 minutes    Lonya Johannesen  [facility] [email or web address], password [email or web address] 02/20/2014, 1:46 PM  LOS: 7 days

## 2014-02-20 NOTE — Procedures (Signed)
S/P lumbar VP

## 2014-02-20 NOTE — Sedation Documentation (Signed)
Pt grimacing w/ eyes closed while hammering occuring.

## 2014-02-20 NOTE — Sedation Documentation (Signed)
Pt stating she is having pain in sacral area and left hip during procedure, calling out in pain intermittently. VSS.

## 2014-02-20 NOTE — Sedation Documentation (Signed)
Pt lifted feet on request dr. Corliss Skainseveshwar.

## 2014-02-20 NOTE — Progress Notes (Signed)
PT Cancellation Note  Patient Details Name: Christine Pierce MRN: [id number] DOB: December 10, 1921   Cancelled Treatment:    Reason Eval/Treat Not Completed: Patient at procedure or test/unavailable. Pt in OR for surgical management of lumbar compression fx. Will re-eval pt tomorrow, as medically stable.    Donnamarie PoagWest, Kymberlyn Eckford [place] , [place]  [phone]  02/20/2014, 11:32 AM

## 2014-02-20 NOTE — Sedation Documentation (Signed)
When asking location of pain, pt mumbling with eyes closed,unable to understand what she was saying.

## 2014-02-21 DIAGNOSIS — N179 Acute kidney failure, unspecified: Secondary | ICD-10-CM | POA: Insufficient documentation

## 2014-02-21 DIAGNOSIS — R279 Unspecified lack of coordination: Secondary | ICD-10-CM | POA: Diagnosis not present

## 2014-02-21 DIAGNOSIS — E860 Dehydration: Secondary | ICD-10-CM | POA: Diagnosis present

## 2014-02-21 DIAGNOSIS — E119 Type 2 diabetes mellitus without complications: Secondary | ICD-10-CM | POA: Diagnosis not present

## 2014-02-21 DIAGNOSIS — G8929 Other chronic pain: Secondary | ICD-10-CM | POA: Diagnosis not present

## 2014-02-21 DIAGNOSIS — M25552 Pain in left hip: Secondary | ICD-10-CM | POA: Diagnosis not present

## 2014-02-21 DIAGNOSIS — Z66 Do not resuscitate: Secondary | ICD-10-CM | POA: Diagnosis present

## 2014-02-21 DIAGNOSIS — Z7952 Long term (current) use of systemic steroids: Secondary | ICD-10-CM | POA: Diagnosis not present

## 2014-02-21 DIAGNOSIS — S32020S Wedge compression fracture of second lumbar vertebra, sequela: Secondary | ICD-10-CM | POA: Diagnosis not present

## 2014-02-21 DIAGNOSIS — S32020D Wedge compression fracture of second lumbar vertebra, subsequent encounter for fracture with routine healing: Secondary | ICD-10-CM | POA: Diagnosis not present

## 2014-02-21 DIAGNOSIS — S329XXA Fracture of unspecified parts of lumbosacral spine and pelvis, initial encounter for closed fracture: Secondary | ICD-10-CM | POA: Diagnosis not present

## 2014-02-21 DIAGNOSIS — F329 Major depressive disorder, single episode, unspecified: Secondary | ICD-10-CM | POA: Diagnosis present

## 2014-02-21 DIAGNOSIS — M25551 Pain in right hip: Secondary | ICD-10-CM | POA: Diagnosis present

## 2014-02-21 DIAGNOSIS — N183 Chronic kidney disease, stage 3 (moderate): Secondary | ICD-10-CM | POA: Diagnosis present

## 2014-02-21 DIAGNOSIS — N182 Chronic kidney disease, stage 2 (mild): Secondary | ICD-10-CM | POA: Diagnosis not present

## 2014-02-21 DIAGNOSIS — J449 Chronic obstructive pulmonary disease, unspecified: Secondary | ICD-10-CM | POA: Diagnosis present

## 2014-02-21 DIAGNOSIS — E785 Hyperlipidemia, unspecified: Secondary | ICD-10-CM | POA: Diagnosis present

## 2014-02-21 DIAGNOSIS — R260 Ataxic gait: Secondary | ICD-10-CM | POA: Diagnosis not present

## 2014-02-21 DIAGNOSIS — I2699 Other pulmonary embolism without acute cor pulmonale: Secondary | ICD-10-CM | POA: Diagnosis present

## 2014-02-21 DIAGNOSIS — I82431 Acute embolism and thrombosis of right popliteal vein: Secondary | ICD-10-CM | POA: Diagnosis present

## 2014-02-21 DIAGNOSIS — R079 Chest pain, unspecified: Secondary | ICD-10-CM | POA: Diagnosis not present

## 2014-02-21 DIAGNOSIS — M6281 Muscle weakness (generalized): Secondary | ICD-10-CM | POA: Diagnosis not present

## 2014-02-21 DIAGNOSIS — M4856XA Collapsed vertebra, not elsewhere classified, lumbar region, initial encounter for fracture: Secondary | ICD-10-CM | POA: Diagnosis present

## 2014-02-21 DIAGNOSIS — E669 Obesity, unspecified: Secondary | ICD-10-CM | POA: Diagnosis present

## 2014-02-21 DIAGNOSIS — N390 Urinary tract infection, site not specified: Secondary | ICD-10-CM | POA: Diagnosis not present

## 2014-02-21 DIAGNOSIS — E780 Pure hypercholesterolemia: Secondary | ICD-10-CM | POA: Diagnosis present

## 2014-02-21 DIAGNOSIS — I129 Hypertensive chronic kidney disease with stage 1 through stage 4 chronic kidney disease, or unspecified chronic kidney disease: Secondary | ICD-10-CM | POA: Diagnosis present

## 2014-02-21 DIAGNOSIS — Z79899 Other long term (current) drug therapy: Secondary | ICD-10-CM | POA: Diagnosis not present

## 2014-02-21 DIAGNOSIS — H9190 Unspecified hearing loss, unspecified ear: Secondary | ICD-10-CM | POA: Diagnosis present

## 2014-02-21 DIAGNOSIS — M79651 Pain in right thigh: Secondary | ICD-10-CM | POA: Diagnosis not present

## 2014-02-21 DIAGNOSIS — F419 Anxiety disorder, unspecified: Secondary | ICD-10-CM | POA: Diagnosis present

## 2014-02-21 DIAGNOSIS — R072 Precordial pain: Secondary | ICD-10-CM | POA: Diagnosis not present

## 2014-02-21 DIAGNOSIS — M549 Dorsalgia, unspecified: Secondary | ICD-10-CM | POA: Diagnosis present

## 2014-02-21 DIAGNOSIS — R278 Other lack of coordination: Secondary | ICD-10-CM | POA: Diagnosis not present

## 2014-02-21 DIAGNOSIS — Z888 Allergy status to other drugs, medicaments and biological substances status: Secondary | ICD-10-CM | POA: Diagnosis not present

## 2014-02-21 DIAGNOSIS — Z7982 Long term (current) use of aspirin: Secondary | ICD-10-CM | POA: Diagnosis not present

## 2014-02-21 DIAGNOSIS — E1129 Type 2 diabetes mellitus with other diabetic kidney complication: Secondary | ICD-10-CM | POA: Diagnosis not present

## 2014-02-21 DIAGNOSIS — K210 Gastro-esophageal reflux disease with esophagitis: Secondary | ICD-10-CM | POA: Diagnosis present

## 2014-02-21 DIAGNOSIS — R0602 Shortness of breath: Secondary | ICD-10-CM | POA: Diagnosis not present

## 2014-02-21 DIAGNOSIS — D72829 Elevated white blood cell count, unspecified: Secondary | ICD-10-CM | POA: Diagnosis present

## 2014-02-21 MED ORDER — SENNOSIDES-DOCUSATE SODIUM 8.6-50 MG PO TABS: 2.0000 | ORAL_TABLET | Freq: Every day | ORAL | Status: DC

## 2014-02-21 MED ORDER — OXYCODONE HCL ER 10 MG PO T12A: 10.0000 mg | EXTENDED_RELEASE_TABLET | Freq: Two times a day (BID) | ORAL | Status: DC

## 2014-02-21 MED ORDER — MINERAL OIL RE ENEM
1.0000 | ENEMA | Freq: Once | RECTAL | Status: AC
  Administered 2014-02-21: 1 via RECTAL
  Filled 2014-02-21: qty 1

## 2014-02-21 MED ORDER — POLYETHYLENE GLYCOL 3350 17 G PO PACK: 17.0000 g | PACK | Freq: Every day | ORAL | Status: DC

## 2014-02-21 MED ORDER — AMOXICILLIN 500 MG PO CAPS: 500.0000 mg | ORAL_CAPSULE | Freq: Three times a day (TID) | ORAL | Status: AC

## 2014-02-21 MED ORDER — AMLODIPINE BESYLATE 5 MG PO TABS: 5.0000 mg | ORAL_TABLET | Freq: Every day | ORAL | Status: DC

## 2014-02-21 MED ORDER — OXYCODONE HCL 5 MG PO TABS: 5.0000 mg | ORAL_TABLET | ORAL | Status: DC | PRN

## 2014-02-21 MED ORDER — AMPHETAMINE-DEXTROAMPHETAMINE 5 MG PO TABS: ORAL_TABLET | ORAL | Status: DC

## 2014-02-21 NOTE — Discharge Summary (Signed)
Physician Discharge Summary  Wells GuilesMary J Pierce ZOX:[id number] DOB: 1921-07-21 DOA: 02/13/2014  PCP: Kimber RelicGREEN, ARTHUR G, MD  Admit date: 02/13/2014 Discharge date: 02/21/2014  Time spent: 60 minutes  Recommendations for Outpatient Follow-up:  1. Dental f/u for lucency seen on CT 2. Wean narcotics 3. Monitor for resolution of constipation 4. Amoxil stop date 12/23- please check UA 5. Remove foley cath in 1-2 days- holding Oxybutynin- resume if needed  Discharge Condition: stable Diet recommendation: heart healthy, diabetic diet  Discharge Diagnoses:  Principal Problem:   Compression fracture of L2 Active Problems:   Back pain   UTI (urinary tract infection)   Anxiety state   CKD (chronic kidney disease) stage 2, GFR 60-89 ml/min   Diabetes mellitus with renal manifestations, controlled   Chronic left hip pain   History of present illness:  80 year old female with history of hypertension, depression, obesity, hyperlipidemia, chronic back pain who presented after a fall and was found in the ER to have moderate L2 compression fracture  Hospital Course:  L2 compression fracture: -new hospital problem - Pain medications changed to oxycodone/Percocet but patient as she continued to have significant amount of pain, added OxyContin 10 mg every 12 hours -Dr. Cena BentonVega discussed current issues with compression fracture and pain with Dr. August Saucerean who is the patient's orthopedist and it was recommended to obtain an IR consult if pain is significant -IR has evaluated the patient- MRI L spine reveals progression of L2 fx which is now severe with severe central canal stenosis at L2- patient still able to ambulate - underwent kyphoplasty on 12/17 and back pain has improved significantly - Plan for discharge to SNF   UTI: new hospital problem - UC: > 100,000 cfu of e coli  - Has received 4 days rocephin- discontinued on 12/16 but repeat UA 12/18 still positive- started amoxil- stop date 12/23  Chronic  left hip pain - now that back pain has improved, pt more fixated on left hip pain- cont current doses of narcotics for now  Urinary retention - likely due to pain, narcotics, severe constipation and Oxybutynin - hold Oxybutinin, resolve constipation, allow to ambulate and remove foley in 1-2 days  COPD - stable, continue bronchodilators  AKI on CKD 3 -BUN/ Cr resolved to normal  DM 2 - Blood sugars well controlled per Bmets - Last A1c 6.2 on 11/16 -Not checking sugars while in the hospital  HTN - amlodipine added 12/15- BP noted to be uncontrolled when pain is severe  Dental disease? -Lucency noted posterior to the right maxillary teeth with periapical lucency on head CT  Constipation - has been receiving laxatives daily and received a dulcolax suppository without results- getting enema today    Procedures:  12/17- kyphoplasty  Consultations:  IR  Discharge Exam: Filed Weights   02/13/14 1726 02/13/14 2359  Weight: 72.576 kg (160 lb) 77.021 kg (169 lb 12.8 oz)   Filed Vitals:   02/21/14 1414  BP: 119/73  Pulse: 73  Temp: 98.8 F (37.1 C)  Resp: 18    General: AAO x 3, no distress Cardiovascular: RRR, no murmurs  Respiratory: clear to auscultation bilaterally GI: soft, non-tender, non-distended, bowel sound positive  Discharge Instructions You were cared for by a hospitalist during your hospital stay. If you have any questions about your discharge medications or the care you received while you were in the hospital after you are discharged, you can call the unit and asked to speak with the hospitalist on call if the hospitalist that  took care of you is not available. Once you are discharged, your primary care physician will handle any further medical issues. Please note that NO REFILLS for any discharge medications will be authorized once you are discharged, as it is imperative that you return to your primary care physician (or establish a relationship with a  primary care physician if you do not have one) for your aftercare needs so that they can reassess your need for medications and monitor your lab values.      Discharge Instructions    Diet - low sodium heart healthy    Complete by:  As directed   And carb modified     Increase activity slowly    Complete by:  As directed             Medication List    STOP taking these medications        dextromethorphan 30 MG/5ML liquid  Commonly known as:  DELSYM     oxybutynin 10 MG 24 hr tablet  Commonly known as:  DITROPAN-XL      TAKE these medications        acetaminophen 500 MG tablet  Commonly known as:  TYLENOL  Take 500 mg by mouth every 4 (four) hours as needed (pain).     albuterol 108 (90 BASE) MCG/ACT inhaler  Commonly known as:  PROVENTIL HFA;VENTOLIN HFA  Inhale 2 puffs into the lungs every 6 (six) hours as needed for wheezing.     ALPRAZolam 0.5 MG tablet  Commonly known as:  XANAX  Take 1 tablet (0.5 mg total) by mouth 2 (two) times daily as needed for anxiety.     amLODipine 5 MG tablet  Commonly known as:  NORVASC  Take 1 tablet (5 mg total) by mouth daily.     amoxicillin 500 MG capsule  Commonly known as:  AMOXIL  Take 1 capsule (500 mg total) by mouth every 8 (eight) hours.     amphetamine-dextroamphetamine 5 MG tablet  Commonly known as:  ADDERALL  Take one tablet by mouth twice daily for ADHD     aspirin 81 MG chewable tablet  Chew 81 mg by mouth daily.     citalopram 20 MG tablet  Commonly known as:  CELEXA  Take 1 tablet (20 mg total) by mouth daily.     dextromethorphan-guaiFENesin 30-600 MG per 12 hr tablet  Commonly known as:  MUCINEX DM  Take 1 tablet by mouth every 12 (twelve) hours as needed for cough (congestion).     famotidine 20 MG tablet  Commonly known as:  PEPCID  One twice daily to suppress stomach acid     hydrocortisone cream 1 %  Apply to pruritic or irritated areas of legs twice daily as needed     lovastatin 40 MG  tablet  Commonly known as:  MEVACOR  Take 40 mg by mouth every morning.     Melatonin 3 MG Caps  Take 3 mg by mouth at bedtime as needed (Sleep).     multivitamin with minerals Tabs tablet  Take 1 tablet by mouth daily.     nitroGLYCERIN 0.4 MG SL tablet  Commonly known as:  NITROSTAT  Place 0.4 mg under the tongue every 5 (five) minutes as needed for chest pain.     omeprazole 40 MG capsule  Commonly known as:  PRILOSEC  Take 40 mg by mouth 2 (two) times daily.     OxyCODONE 10 mg T12a 12 hr tablet  Commonly  known as:  OXYCONTIN  Take 1 tablet (10 mg total) by mouth every 12 (twelve) hours.     oxyCODONE 5 MG immediate release tablet  Commonly known as:  Oxy IR/ROXICODONE  Take 1 tablet (5 mg total) by mouth every 4 (four) hours as needed for breakthrough pain.     polyethylene glycol packet  Commonly known as:  MIRALAX  Take 17 g by mouth daily.     [employer] with seat and brakes.     senna-docusate 8.6-50 MG per tablet  Commonly known as:  Senokot-S  Take 2 tablets by mouth at bedtime.     telmisartan-hydrochlorothiazide 80-12.5 MG per tablet  Commonly known as:  MICARDIS HCT  Take 1 tablet by mouth daily.     trolamine salicylate 10 % cream  Commonly known as:  ASPERCREME  Apply 1 application topically 3 (three) times daily. Right grater trochanteric bursa area       Allergies  Allergen Reactions  . Macrodantin [Nitrofurantoin Macrocrystal]     Affects kidneys      The results of significant diagnostics from this hospitalization (including imaging, microbiology, ancillary and laboratory) are listed below for reference.    Significant Diagnostic Studies: Dg Chest 2 View  02/13/2014   CLINICAL DATA:  80 year old female status post fall  EXAM: CHEST  2 VIEW  COMPARISON:  Prior chest x-ray 09/17/2013  FINDINGS: Stable cardiac and mediastinal contours. Atherosclerotic calcifications present in the aorta. No evidence of a pneumothorax  or pleural effusion. Stable central bronchitic change of a mild severity. And upper lumbar compression fracture is incompletely imaged. This represents a new finding compared to 09/10/2012. However, the appearance is suggestive of a chronic injury.  IMPRESSION: 1. No active cardiopulmonary disease. 2. Incompletely imaged upper lumbar compression fracture is an interval finding compared to the prior chest CT from 09/10/2012. However, the appearance suggests a remote injury. 3. Aortic atherosclerosis.   Electronically Signed   By: Malachy Moan M.D.   On: 02/13/2014 20:29   Dg Lumbar Spine Complete  02/13/2014   CLINICAL DATA:  Fall with low back pain.  EXAM: LUMBAR SPINE - COMPLETE 4+ VIEW  COMPARISON:  05/02/2010 and 09/22/2008 as well as CT 05/02/2010  FINDINGS: Exam demonstrates diffuse decreased bone mineralization. Exam demonstrates mild spondylosis of the lumbar spine to include facet arthropathy. There is a moderate compression fracture of L2 new since 2012 and likely an acute injury. There is prominent deformity of the anterior superior aspect of the vertebral body. No retropulsion component. There is mild grade 1 anterolisthesis of L4 on L5 unchanged. Disc space narrowing at the L5-S1 level unchanged. T11-12 calcified disc unchanged. Subtle lucency along the superior border of the left sacral canal as cannot completely exclude a fracture in this location.  IMPRESSION: Moderate L2 compression fracture likely acute.  Subtle lucency over the superior border of the left sacral L eft as cannot exclude a fracture.  Mild spondylosis of the lumbar spine with disc disease at the L5-S1 level.  Stable grade 1 anterolisthesis of L4 on L5.   Electronically Signed   By: Elberta Fortis M.D.   On: 02/13/2014 20:30   Dg Hip Complete Left  02/15/2014   CLINICAL DATA:  Initial evaluation for left hip pain, fell 2 days ago  EXAM: LEFT HIP - COMPLETE 2+ VIEW  COMPARISON:  None.  FINDINGS: Mild to moderate bilateral  hip arthritis. No femur fracture. Pelvic bones intact. Pubic symphysitis. Bilateral sacroiliitis.  IMPRESSION: No acute findings.   Electronically Signed   By: Esperanza Heiraymond  Rubner M.D.   On: 02/15/2014 19:43   Ct Head Wo Contrast  02/13/2014   CLINICAL DATA:  80 year old who fell while at the nursing home earlier today.  EXAM: CT HEAD WITHOUT CONTRAST  CT CERVICAL SPINE WITHOUT CONTRAST  TECHNIQUE: Multidetector CT imaging of the head and cervical spine was performed following the standard protocol without intravenous contrast. Multiplanar CT image reconstructions of the cervical spine were also generated.  COMPARISON:  CT head and cervical spine 04/11/2012.  FINDINGS: CT HEAD FINDINGS  Moderate to severe age related cortical and deep atrophy, unchanged. Moderate changes of small vessel disease of the cerebral white matter, unchanged. No mass lesion. No midline shift. No acute hemorrhage or hematoma. No extra-axial fluid collections. No evidence of acute infarction. No significant interval change.  No skull fracture or other focal osseous abnormality involving the skull. Mucosal thickening involving the right maxillary sinus. Remaining visualized paranasal sinuses, bilateral mastoid air cells and bilateral middle ear cavities well aerated. Lucency involving the roots of the 3 most posterior right maxillary teeth; this area was not imaged on the prior examination.  CT CERVICAL SPINE FINDINGS  No fractures identified involving the cervical spine. Sagittal reconstructed images demonstrate anatomic alignment and exaggeration of the usual cervical lordosis. Facet joints intact throughout with severe degenerative changes and fusion of the left facets at C3 and C4. Borderline multifactorial spinal stenosis at C3-4. Chronic central disc protrusion at C3-4 with calcification in the posterior annular fibers. Vacuum disc phenomenon at C5-6. Coronal reformatted images demonstrate an intact craniocervical junction, intact dens,  and intact lateral masses throughout. Severe degenerative changes at the C1-C2 articulation.  IMPRESSION: 1. No acute intracranial abnormality. 2. Stable age related moderate to severe cortical atrophy and mild chronic microvascular ischemic changes of the white matter. 3. No cervical spine fractures identified. 4. Multilevel degenerative disc disease, spondylosis and facet degenerative changes throughout the cervical spine with borderline multifactorial spinal stenosis at C3-4. 5. Dental disease involving the posterior right maxillary teeth with periapical lucency.   Electronically Signed   By: Hulan Saashomas  Lawrence M.D.   On: 02/13/2014 19:35   Ct Cervical Spine Wo Contrast  02/13/2014   CLINICAL DATA:  80 year old who fell while at the nursing home earlier today.  EXAM: CT HEAD WITHOUT CONTRAST  CT CERVICAL SPINE WITHOUT CONTRAST  TECHNIQUE: Multidetector CT imaging of the head and cervical spine was performed following the standard protocol without intravenous contrast. Multiplanar CT image reconstructions of the cervical spine were also generated.  COMPARISON:  CT head and cervical spine 04/11/2012.  FINDINGS: CT HEAD FINDINGS  Moderate to severe age related cortical and deep atrophy, unchanged. Moderate changes of small vessel disease of the cerebral white matter, unchanged. No mass lesion. No midline shift. No acute hemorrhage or hematoma. No extra-axial fluid collections. No evidence of acute infarction. No significant interval change.  No skull fracture or other focal osseous abnormality involving the skull. Mucosal thickening involving the right maxillary sinus. Remaining visualized paranasal sinuses, bilateral mastoid air cells and bilateral middle ear cavities well aerated. Lucency involving the roots of the 3 most posterior right maxillary teeth; this area was not imaged on the prior examination.  CT CERVICAL SPINE FINDINGS  No fractures identified involving the cervical spine. Sagittal reconstructed  images demonstrate anatomic alignment and exaggeration of the usual cervical lordosis. Facet joints intact throughout with severe degenerative changes and fusion of the left facets at C3  and C4. Borderline multifactorial spinal stenosis at C3-4. Chronic central disc protrusion at C3-4 with calcification in the posterior annular fibers. Vacuum disc phenomenon at C5-6. Coronal reformatted images demonstrate an intact craniocervical junction, intact dens, and intact lateral masses throughout. Severe degenerative changes at the C1-C2 articulation.  IMPRESSION: 1. No acute intracranial abnormality. 2. Stable age related moderate to severe cortical atrophy and mild chronic microvascular ischemic changes of the white matter. 3. No cervical spine fractures identified. 4. Multilevel degenerative disc disease, spondylosis and facet degenerative changes throughout the cervical spine with borderline multifactorial spinal stenosis at C3-4. 5. Dental disease involving the posterior right maxillary teeth with periapical lucency.   Electronically Signed   By: Hulan Saas M.D.   On: 02/13/2014 19:35   Mr Lumbar Spine Wo Contrast  02/18/2014   CLINICAL DATA:  Back pain after a mechanical fall 02/12/2014.  EXAM: MRI LUMBAR SPINE WITHOUT CONTRAST  TECHNIQUE: Multiplanar, multisequence MR imaging of the lumbar spine was performed. No intravenous contrast was administered.  COMPARISON:  Lumbar spine radiographs 02/13/2014.  FINDINGS: There is significant progression of a severe compression fracture is present at L2. Retropulsed bone extends 7.5 mm into the canal. There is severe central canal stenosis at the L2 level as a result. Mild to moderate foraminal narrowing is present bilaterally at L1-2 and L2-3. Vertebral body heights are otherwise maintained. There is a superior endplate Schmorl's node at L4. Slight anterolisthesis is present at L4-5.  A right renal cyst is present. A 3 cm septated right adnexal cyst is noted.  Limited imaging of the abdomen is otherwise unremarkable. There is no significant adenopathy.  L3-4: A broad-based disc protrusion and mild facet hypertrophy bilaterally results in mild left subarticular and bilateral foraminal narrowing.  L4-5: Anterolisthesis results in uncovering of a broad-based disc protrusion. Moderate facet hypertrophy is present. Moderate subarticular stenosis is worse on the right. Mild foraminal narrowing is also worse on the right.  L5-S1: There is chronic loss of disc height without a significant stenosis.  IMPRESSION: 1. 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 2. Mild to moderate foraminal narrowing bilaterally at L1-2 and L2-3. 3. Mild left subarticular and bilateral foraminal narrowing at L3-4. 4. Grade 1 anterolisthesis with moderate serve radicular stenosis bilaterally at L4-5, worse on the right. 5. Mild foraminal narrowing at L4-5 is worse on the right. 6. 3 cm septated right adnexal cyst. This is stable dating back to a CT scan of 2012. These results will be called to the ordering clinician or representative by the Radiologist Assistant, and communication documented in the PACS or zVision Dashboard.   Electronically Signed   By: Gennette Pac M.D.   On: 02/18/2014 20:18   Mr Sacrum/si Joints Wo Contrast  02/19/2014   CLINICAL DATA:  Status post fall 02/12/2014.  Back pain.  EXAM: MR SACRUM WITHOUT CONTRAST  TECHNIQUE: Multiplanar, multisequence MR imaging was performed. No intravenous contrast was administered.  COMPARISON:  None.  FINDINGS: There is no sacral fracture. Imaged pelvis and hips also demonstrate normal signal without fracture or worrisome lesion. The sacroiliac joints appear normal without effusion, erosion or notable degenerative change. Imaged intrapelvic contents show sigmoid diverticulosis.  IMPRESSION: Negative for fracture.  No acute  abnormality.  Sigmoid diverticulosis.   Electronically Signed   By: Drusilla Kanner M.D.   On: 02/19/2014 08:17   Dg Humerus Right  02/13/2014   CLINICAL  DATA:  Fall. Right upper arm pain. History of osteoporosis.  EXAM: RIGHT HUMERUS - 2+ VIEW  COMPARISON:  10/10/2006  FINDINGS: No fracture observed. The lateral projection raises the possibility of an elbow joint effusion although this is equivocal. If the patient has symptoms in the vicinity of the elbow, consider dedicated elbow 0 radiography.  IMPRESSION: 1. No acute bony findings. Questionable elbow joint effusion. Correlate with any symptoms in the vicinity of the elbow in determining whether dedicated elbow radiography is warranted.   Electronically Signed   By: Herbie Baltimore M.D.   On: 02/13/2014 20:28    Microbiology: Recent Results (from the past 240 hour(s))  Urine culture     Status: None   Collection Time: 02/13/14  8:40 PM  Result Value Ref Range Status   Specimen Description URINE, CLEAN CATCH  Final   Special Requests ADDED [id number]  Final   Culture  Setup Time   Final    02/14/2014 06:46 Performed at [facility]    Colony Count   Final    >=100,000 COLONIES/ML Performed at [facility]    Culture   Final    ESCHERICHIA COLI Performed at [facility]    Report Status 02/17/2014 FINAL  Final   Organism ID, Bacteria ESCHERICHIA COLI  Final      Susceptibility   Escherichia coli - MIC*    AMPICILLIN <=2 SENSITIVE Sensitive     CEFAZOLIN <=4 SENSITIVE Sensitive     CEFTRIAXONE <=1 SENSITIVE Sensitive     CIPROFLOXACIN <=0.25 SENSITIVE Sensitive     GENTAMICIN <=1 SENSITIVE Sensitive     LEVOFLOXACIN <=0.12 SENSITIVE Sensitive     NITROFURANTOIN <=16 SENSITIVE Sensitive     TOBRAMYCIN <=1 SENSITIVE Sensitive     TRIMETH/SULFA >=320 RESISTANT Resistant     PIP/TAZO <=4 SENSITIVE Sensitive     * ESCHERICHIA COLI     Labs: Basic Metabolic Panel:  Recent Labs Lab 02/19/14 0931   NA 135*  K 3.9  CL 94*  CO2 29  GLUCOSE 93  BUN 20  CREATININE 0.92  CALCIUM 9.8   Liver Function Tests: No results for input(s): AST, ALT, ALKPHOS, BILITOT, PROT, ALBUMIN in the last 168 hours. No results for input(s): LIPASE, AMYLASE in the last 168 hours. No results for input(s): AMMONIA in the last 168 hours. CBC:  Recent Labs Lab 02/19/14 1218  WBC 8.5  HGB 12.1  HCT 36.5  MCV 91.0  PLT 205   Cardiac Enzymes: No results for input(s): CKTOTAL, CKMB, CKMBINDEX, TROPONINI in the last 168 hours. BNP: BNP (last 3 results)  Recent Labs  12/01/13 1652  PROBNP 187.0*   CBG: No results for input(s): GLUCAP in the last 168 hours.     SignedCalvert Cantor, MD [facility] 02/21/2014, 3:00 PM

## 2014-02-21 NOTE — Progress Notes (Signed)
Physical Therapy Treatment Patient Details Name: Christine GuilesMary J Pierce MRN: [id number] DOB: January 08, 1922 Today's Date: 02/21/2014    History of Present Illness 80 y.o. female, resident of assisted living, with mechanical fall. X ray of her LS spine, shows moderate L2 compressive Fx, along with a UA showing a UTI. Pt s/p vertebroplasty 12/18.    PT Comments    Pt seen for re-eval following new L2 vertebroplasty on 12/15. Pt continues to demo decr safety awareness and unsteadiness in gt. Pt remains appropriate for D/C to SNF for post acute rehab. Goals remain appropriate.   Follow Up Recommendations  SNF;Supervision/Assistance - 24 hour     Equipment Recommendations  None recommended by PT    Recommendations for Other Services       Precautions / Restrictions Precautions Precautions: Fall;Back Precaution Comments: reviewed back precautions  Required Braces or Orthoses: Spinal Brace Spinal Brace: Lumbar corset;Applied in sitting position Restrictions Weight Bearing Restrictions: No    Mobility  Bed Mobility               General bed mobility comments: pt up in chair  Transfers Overall transfer level: Needs assistance Equipment used: Rolling walker (2 wheeled) Transfers: Sit to/from Stand Sit to Stand: Mod assist         General transfer comment: cues for hand placement and safety; pt with heavy lean posteriorly   Ambulation/Gait Ambulation/Gait assistance: +2 safety/equipment;Mod assist Ambulation Distance (Feet): 80 Feet (20', 60')   Gait Pattern/deviations: Step-through pattern;Shuffle;Antalgic;Wide base of support;Trunk flexed;Decreased stride length Gait velocity: impulsively quick   General Gait Details: pt required sitting rest break; pt unsteady and required multimodal cues for RW safety; pt with heavy lean posteriorly; 2nd person (A) with following with chair for safety   Stairs            Wheelchair Mobility    Modified Rankin (Stroke Patients  Only)       Balance Overall balance assessment: Needs assistance         Standing balance support: During functional activity;Bilateral upper extremity supported Standing balance-Leahy Scale: Poor Standing balance comment: unsteady                    Cognition Arousal/Alertness: Awake/alert Behavior During Therapy: Impulsive Overall Cognitive Status: Impaired/Different from baseline Area of Impairment: Orientation;Memory;Following commands;Safety/judgement;Problem solving;Awareness Orientation Level: Disoriented to;Situation;Place;Time   Memory: Decreased short-term memory;Decreased recall of precautions Following Commands: Follows one step commands with increased time Safety/Judgement: Decreased awareness of safety;Decreased awareness of deficits   Problem Solving: Slow processing;Decreased initiation;Requires tactile cues;Difficulty sequencing;Requires verbal cues General Comments: pt with new UTI per MD notes     Exercises General Exercises - Lower Extremity Long Arc Quad: AROM;Strengthening;Both;10 reps Straight Leg Raises: AROM;Both;10 reps;Seated Low Level/ICU Exercises Ankle Circles/Pumps: AROM;Both;10 reps;Seated    General Comments General comments (skin integrity, edema, etc.): reviewed donning of back brace; pt will need (A)       Pertinent Vitals/Pain Pain Assessment: Faces Pain Score: 10-Worst pain ever Pain Location: low back and Lt LE  Pain Descriptors / Indicators: Shooting;Sharp Pain Intervention(s): Monitored during session;Premedicated before session;Repositioned    Home Living                      Prior Function            PT Goals (current goals can now be found in the care plan section) Acute Rehab PT Goals Patient Stated Goal: none stated PT Goal Formulation: With patient/family Time  For Goal Achievement: 02/28/14 Potential to Achieve Goals: Good Progress towards PT goals: Progressing toward goals    Frequency  Min  3X/week    PT Plan Current plan remains appropriate    Co-evaluation             End of Session Equipment Utilized During Treatment: Gait belt;Back brace Activity Tolerance: Patient limited by pain;Patient limited by fatigue Patient left: in chair;with call bell/phone within reach;with chair alarm set     Time: [id number] PT Time Calculation (min) (ACUTE ONLY): 17 min  Charges:  $Gait Training: 8-22 mins                    G Codes:      Donnamarie PoagWest, Brittnie Lewey RachelN, [place]PT  [phone] 02/21/2014, 12:11 PM

## 2014-02-21 NOTE — Progress Notes (Signed)
[facility], spoke with RN. [facility] will provide non emergent transport at this time.

## 2014-02-21 NOTE — Progress Notes (Signed)
  Subjective: Pt sitting up in chair; has ambulated in hallway ; still has some lower back /left leg discomfort but mobility has improved since VP  Objective: Vital signs in last 24 hours: Temp:  [97.8 F (36.6 C)-98.4 F (36.9 C)] 97.8 F (36.6 C) (12/19 0516) Pulse Rate:  [70-77] 75 (12/19 0516) Resp:  [20] 20 (12/19 0516) BP: (120-153)/(57-73) 138/57 mmHg (12/19 0516) SpO2:  [93 %-97 %] 93 % (12/19 0516) Last BM Date: 02/13/14  Intake/Output from previous day: 12/18 0701 - 12/19 0700 In: 890 [P.O.:840; IV Piggyback:50] Out: 900 [Urine:900] Intake/Output this shift: Total I/O In: 120 [P.O.:120] Out: 200 [Urine:200]  L2 site soft, mildly tender , no definite hematoma; pt moving all fours ok  Lab Results:   Recent Labs  02/19/14 1218  WBC 8.5  HGB 12.1  HCT 36.5  PLT 205   BMET  Recent Labs  02/19/14 0931  NA 135*  K 3.9  CL 94*  CO2 29  GLUCOSE 93  BUN 20  CREATININE 0.92  CALCIUM 9.8   PT/INR  Recent Labs  02/19/14 1218  LABPROT 13.4  INR 1.01   ABG No results for input(s): PHART, HCO3 in the last 72 hours.  Invalid input(s): PCO2, PO2  Studies/Results: No results found.  Anti-infectives: Anti-infectives    Start     Dose/Rate Route Frequency Ordered Stop   02/20/14 1630  amoxicillin (AMOXIL) capsule 500 mg     500 mg Oral 3 times per day 02/20/14 1545     02/20/14 0949  tobramycin (NEBCIN) 1.2 G powder    Comments:  King-Cushman, Juliet: cabinet override      02/20/14 0949 02/20/14 2159   02/20/14 0926  ceFAZolin (ANCEF) 2-3 GM-% IVPB SOLR  Status:  Discontinued    Comments:  Barley, Jenny   : cabinet override      02/20/14 0926 02/20/14 0930   02/19/14 1300  ceFAZolin (ANCEF) IVPB 2 g/50 mL premix    Comments:  Hang ON CALL to xray 12/18   2 g100 mL/hr over 30 Minutes Intravenous On call 02/19/14 1211 02/20/14 1020   02/14/14 2200  cefTRIAXone (ROCEPHIN) 1 g in dextrose 5 % 50 mL IVPB - Premix  Status:  Discontinued     1 g100  mL/hr over 30 Minutes Intravenous Every 24 hours 02/13/14 2347 02/18/14 0818   02/13/14 2230  cefTRIAXone (ROCEPHIN) 1 g in dextrose 5 % 50 mL IVPB     1 g100 mL/hr over 30 Minutes Intravenous  Once 02/13/14 2215 02/13/14 2340      Assessment/Plan: s/p L2 VP 12/18; cont with PT/OOB; taper pain med use; tx UTI  LOS: 8 days    ALLRED,D [facility]VIN 02/21/2014

## 2014-02-22 NOTE — Care Management Note (Signed)
    Page 1 of 1   02/22/2014     11:04:35 AM CARE MANAGEMENT NOTE 02/22/2014  Patient:  Christine Pierce,Christine Pierce   Account Number:  [id number]  Date Initiated:  02/21/2014  Documentation initiated by:  [facility]FF[facility],Hadleigh Felber  Subjective/Objective Assessment:     Action/Plan:   SNF   Anticipated DC Date:  02/21/2014   Anticipated DC Plan:           Choice offered to / List presented to:             Status of service:  Completed, signed off Medicare Important Message given?   (If response is "NO", the following Medicare IM given date fields will be blank) Date Medicare IM given:   Medicare IM given by:   Date Additional Medicare IM given:   Additional Medicare IM given by:    Discharge Disposition:  SKILLED NURSING FACILITY  Per UR Regulation:    If discussed at Long Length of Stay Meetings, dates discussed:    Comments:  02/21/14 08:00 CM notes pt to go to SNF; CSW arranging.  No other CM needs were communicated.  Freddy JakschSarah Halo Shevlin, BSN, CM [phone].

## 2014-02-24 DIAGNOSIS — S32020S Wedge compression fracture of second lumbar vertebra, sequela: Secondary | ICD-10-CM | POA: Diagnosis not present

## 2014-02-24 DIAGNOSIS — E119 Type 2 diabetes mellitus without complications: Secondary | ICD-10-CM | POA: Diagnosis not present

## 2014-02-24 DIAGNOSIS — J449 Chronic obstructive pulmonary disease, unspecified: Secondary | ICD-10-CM | POA: Diagnosis not present

## 2014-02-24 DIAGNOSIS — N390 Urinary tract infection, site not specified: Secondary | ICD-10-CM | POA: Diagnosis not present

## 2014-02-27 ENCOUNTER — Inpatient Hospital Stay (HOSPITAL_COMMUNITY): Payer: Medicare Other

## 2014-02-27 ENCOUNTER — Encounter (HOSPITAL_COMMUNITY): Payer: Self-pay | Admitting: Emergency Medicine

## 2014-02-27 ENCOUNTER — Emergency Department (HOSPITAL_COMMUNITY): Payer: Medicare Other

## 2014-02-27 ENCOUNTER — Inpatient Hospital Stay (HOSPITAL_COMMUNITY)
Admission: EM | Admit: 2014-02-27 | Discharge: 2014-03-01 | DRG: 682 | Disposition: A | Discharge status: Skilled Nursing Facility | Payer: Medicare Other | Attending: Internal Medicine | Admitting: Internal Medicine

## 2014-02-27 DIAGNOSIS — Z7982 Long term (current) use of aspirin: Secondary | ICD-10-CM | POA: Diagnosis not present

## 2014-02-27 DIAGNOSIS — M25551 Pain in right hip: Secondary | ICD-10-CM

## 2014-02-27 DIAGNOSIS — M6281 Muscle weakness (generalized): Secondary | ICD-10-CM | POA: Diagnosis not present

## 2014-02-27 DIAGNOSIS — J449 Chronic obstructive pulmonary disease, unspecified: Secondary | ICD-10-CM | POA: Diagnosis present

## 2014-02-27 DIAGNOSIS — E780 Pure hypercholesterolemia: Secondary | ICD-10-CM | POA: Diagnosis present

## 2014-02-27 DIAGNOSIS — N390 Urinary tract infection, site not specified: Secondary | ICD-10-CM | POA: Diagnosis not present

## 2014-02-27 DIAGNOSIS — R278 Other lack of coordination: Secondary | ICD-10-CM | POA: Diagnosis not present

## 2014-02-27 DIAGNOSIS — I2699 Other pulmonary embolism without acute cor pulmonale: Secondary | ICD-10-CM | POA: Diagnosis present

## 2014-02-27 DIAGNOSIS — Z7952 Long term (current) use of systemic steroids: Secondary | ICD-10-CM

## 2014-02-27 DIAGNOSIS — Z66 Do not resuscitate: Secondary | ICD-10-CM | POA: Diagnosis present

## 2014-02-27 DIAGNOSIS — S32020D Wedge compression fracture of second lumbar vertebra, subsequent encounter for fracture with routine healing: Secondary | ICD-10-CM | POA: Diagnosis not present

## 2014-02-27 DIAGNOSIS — Z888 Allergy status to other drugs, medicaments and biological substances status: Secondary | ICD-10-CM

## 2014-02-27 DIAGNOSIS — M549 Dorsalgia, unspecified: Secondary | ICD-10-CM | POA: Diagnosis present

## 2014-02-27 DIAGNOSIS — R260 Ataxic gait: Secondary | ICD-10-CM | POA: Diagnosis not present

## 2014-02-27 DIAGNOSIS — F329 Major depressive disorder, single episode, unspecified: Secondary | ICD-10-CM | POA: Diagnosis present

## 2014-02-27 DIAGNOSIS — M79651 Pain in right thigh: Secondary | ICD-10-CM

## 2014-02-27 DIAGNOSIS — I82401 Acute embolism and thrombosis of unspecified deep veins of right lower extremity: Secondary | ICD-10-CM | POA: Diagnosis not present

## 2014-02-27 DIAGNOSIS — E119 Type 2 diabetes mellitus without complications: Secondary | ICD-10-CM | POA: Diagnosis present

## 2014-02-27 DIAGNOSIS — E669 Obesity, unspecified: Secondary | ICD-10-CM | POA: Diagnosis present

## 2014-02-27 DIAGNOSIS — G8929 Other chronic pain: Secondary | ICD-10-CM | POA: Diagnosis present

## 2014-02-27 DIAGNOSIS — K210 Gastro-esophageal reflux disease with esophagitis: Secondary | ICD-10-CM | POA: Diagnosis present

## 2014-02-27 DIAGNOSIS — I82431 Acute embolism and thrombosis of right popliteal vein: Secondary | ICD-10-CM | POA: Diagnosis present

## 2014-02-27 DIAGNOSIS — Z79899 Other long term (current) drug therapy: Secondary | ICD-10-CM

## 2014-02-27 DIAGNOSIS — E785 Hyperlipidemia, unspecified: Secondary | ICD-10-CM | POA: Diagnosis present

## 2014-02-27 DIAGNOSIS — I82409 Acute embolism and thrombosis of unspecified deep veins of unspecified lower extremity: Secondary | ICD-10-CM

## 2014-02-27 DIAGNOSIS — H9190 Unspecified hearing loss, unspecified ear: Secondary | ICD-10-CM | POA: Diagnosis present

## 2014-02-27 DIAGNOSIS — I10 Essential (primary) hypertension: Secondary | ICD-10-CM | POA: Diagnosis not present

## 2014-02-27 DIAGNOSIS — E1129 Type 2 diabetes mellitus with other diabetic kidney complication: Secondary | ICD-10-CM | POA: Diagnosis not present

## 2014-02-27 DIAGNOSIS — S32020A Wedge compression fracture of second lumbar vertebra, initial encounter for closed fracture: Secondary | ICD-10-CM | POA: Diagnosis present

## 2014-02-27 DIAGNOSIS — N179 Acute kidney failure, unspecified: Principal | ICD-10-CM

## 2014-02-27 DIAGNOSIS — R079 Chest pain, unspecified: Secondary | ICD-10-CM

## 2014-02-27 DIAGNOSIS — N183 Chronic kidney disease, stage 3 (moderate): Secondary | ICD-10-CM | POA: Diagnosis present

## 2014-02-27 DIAGNOSIS — E860 Dehydration: Secondary | ICD-10-CM | POA: Diagnosis present

## 2014-02-27 DIAGNOSIS — I129 Hypertensive chronic kidney disease with stage 1 through stage 4 chronic kidney disease, or unspecified chronic kidney disease: Secondary | ICD-10-CM | POA: Diagnosis present

## 2014-02-27 DIAGNOSIS — D72829 Elevated white blood cell count, unspecified: Secondary | ICD-10-CM | POA: Diagnosis present

## 2014-02-27 DIAGNOSIS — F419 Anxiety disorder, unspecified: Secondary | ICD-10-CM | POA: Diagnosis present

## 2014-02-27 DIAGNOSIS — R072 Precordial pain: Secondary | ICD-10-CM | POA: Diagnosis not present

## 2014-02-27 DIAGNOSIS — N182 Chronic kidney disease, stage 2 (mild): Secondary | ICD-10-CM | POA: Diagnosis not present

## 2014-02-27 DIAGNOSIS — M4856XA Collapsed vertebra, not elsewhere classified, lumbar region, initial encounter for fracture: Secondary | ICD-10-CM | POA: Diagnosis present

## 2014-02-27 DIAGNOSIS — R0602 Shortness of breath: Secondary | ICD-10-CM | POA: Diagnosis not present

## 2014-02-27 DIAGNOSIS — R279 Unspecified lack of coordination: Secondary | ICD-10-CM | POA: Diagnosis not present

## 2014-02-27 HISTORY — DX: Pain in right hip: M25.551

## 2014-02-27 LAB — COMPREHENSIVE METABOLIC PANEL
ALT: 20 U/L (ref 0–35)
AST: 23 U/L (ref 0–37)
Albumin: 3 g/dL — ABNORMAL LOW (ref 3.5–5.2)
Alkaline Phosphatase: 72 U/L (ref 39–117)
Anion gap: 13 (ref 5–15)
BUN: 61 mg/dL — ABNORMAL HIGH (ref 6–23)
CO2: 22 mmol/L (ref 19–32)
Calcium: 8.5 mg/dL (ref 8.4–10.5)
Chloride: 99 mEq/L (ref 96–112)
Creatinine, Ser: 1.99 mg/dL — ABNORMAL HIGH (ref 0.50–1.10)
GFR calc Af Amer: 24 mL/min — ABNORMAL LOW (ref >90)
GFR calc non Af Amer: 21 mL/min — ABNORMAL LOW (ref >90)
Glucose, Bld: 137 mg/dL — ABNORMAL HIGH (ref 70–99)
Potassium: 3.1 mmol/L — ABNORMAL LOW (ref 3.5–5.1)
SODIUM: 134 mmol/L — AB (ref 135–145)
TOTAL PROTEIN: 5.4 g/dL — AB (ref 6.0–8.3)
Total Bilirubin: 0.7 mg/dL (ref 0.3–1.2)

## 2014-02-27 LAB — CBC WITH DIFFERENTIAL/PLATELET
BASOS ABS: 0 10*3/uL (ref 0.0–0.1)
Basophils Relative: 0 % (ref 0–1)
EOS ABS: 0.1 10*3/uL (ref 0.0–0.7)
Eosinophils Relative: 1 % (ref 0–5)
HCT: 33.9 % — ABNORMAL LOW (ref 36.0–46.0)
Hemoglobin: 11.1 g/dL — ABNORMAL LOW (ref 12.0–15.0)
LYMPHS PCT: 12 % (ref 12–46)
Lymphs Abs: 1.6 10*3/uL (ref 0.7–4.0)
MCH: 29.9 pg (ref 26.0–34.0)
MCHC: 32.7 g/dL (ref 30.0–36.0)
MCV: 91.4 fL (ref 78.0–100.0)
Monocytes Absolute: 1.7 10*3/uL — ABNORMAL HIGH (ref 0.1–1.0)
Monocytes Relative: 12 % (ref 3–12)
Neutro Abs: 10.5 10*3/uL — ABNORMAL HIGH (ref 1.7–7.7)
Neutrophils Relative %: 75 % (ref 43–77)
Platelets: 198 10*3/uL (ref 150–400)
RBC: 3.71 MIL/uL — ABNORMAL LOW (ref 3.87–5.11)
RDW: 13.9 % (ref 11.5–15.5)
WBC: 13.9 10*3/uL — AB (ref 4.0–10.5)

## 2014-02-27 LAB — D-DIMER, QUANTITATIVE (NOT AT ARMC)

## 2014-02-27 LAB — TROPONIN I
TROPONIN I: 0.06 ng/mL — AB (ref <0.031)
Troponin I: <0.03 ng/mL (ref <0.031)

## 2014-02-27 LAB — PROTIME-INR
INR: 1.31 (ref 0.00–1.49)
PROTHROMBIN TIME: 16.5 s — AB (ref 11.6–15.2)

## 2014-02-27 MED ORDER — NITROGLYCERIN 0.4 MG SL SUBL: 0.4000 mg | SUBLINGUAL_TABLET | SUBLINGUAL | Status: DC | PRN

## 2014-02-27 MED ORDER — ASPIRIN 325 MG PO TABS
325.0000 mg | ORAL_TABLET | ORAL | Status: AC
  Administered 2014-02-27: 325 mg via ORAL
  Filled 2014-02-27: qty 1

## 2014-02-27 MED ORDER — BISACODYL 10 MG RE SUPP: 10.0000 mg | Freq: Every day | RECTAL | Status: DC | PRN

## 2014-02-27 MED ORDER — HEPARIN (PORCINE) IN NACL 100-0.45 UNIT/ML-% IJ SOLN
1050.0000 [IU]/h | INTRAMUSCULAR | Status: DC
  Administered 2014-02-27: 1000 [IU]/h via INTRAVENOUS
  Filled 2014-02-27: qty 250

## 2014-02-27 MED ORDER — AMPHETAMINE-DEXTROAMPHETAMINE 10 MG PO TABS
5.0000 mg | ORAL_TABLET | Freq: Two times a day (BID) | ORAL | Status: DC
  Administered 2014-02-28 – 2014-03-01 (×3): 5 mg via ORAL
  Filled 2014-02-27 (×3): qty 1

## 2014-02-27 MED ORDER — ADULT MULTIVITAMIN W/MINERALS CH
1.0000 | ORAL_TABLET | Freq: Every day | ORAL | Status: DC
  Administered 2014-02-28 – 2014-03-01 (×2): 1 via ORAL
  Filled 2014-02-27 (×2): qty 1

## 2014-02-27 MED ORDER — HEPARIN SODIUM (PORCINE) 1000 UNIT/ML IJ SOLN
5000.0000 [IU] | Freq: Three times a day (TID) | INTRAMUSCULAR | Status: DC
  Filled 2014-02-27 (×2): qty 5

## 2014-02-27 MED ORDER — ALPRAZOLAM 0.5 MG PO TABS: 0.5000 mg | ORAL_TABLET | Freq: Two times a day (BID) | ORAL | Status: DC | PRN

## 2014-02-27 MED ORDER — PREDNISONE 10 MG PO TABS
10.0000 mg | ORAL_TABLET | Freq: Every day | ORAL | Status: DC
  Administered 2014-03-01: 10 mg via ORAL
  Filled 2014-02-27: qty 1

## 2014-02-27 MED ORDER — POLYETHYLENE GLYCOL 3350 17 G PO PACK
17.0000 g | PACK | Freq: Every day | ORAL | Status: DC
  Administered 2014-02-28 – 2014-03-01 (×2): 17 g via ORAL
  Filled 2014-02-27 (×2): qty 1

## 2014-02-27 MED ORDER — POTASSIUM CHLORIDE CRYS ER 20 MEQ PO TBCR
40.0000 meq | EXTENDED_RELEASE_TABLET | Freq: Once | ORAL | Status: DC
  Filled 2014-02-27: qty 2

## 2014-02-27 MED ORDER — PANTOPRAZOLE SODIUM 40 MG PO TBEC
80.0000 mg | DELAYED_RELEASE_TABLET | Freq: Every day | ORAL | Status: DC
  Administered 2014-02-28 – 2014-03-01 (×2): 80 mg via ORAL
  Filled 2014-02-27 (×2): qty 2

## 2014-02-27 MED ORDER — ASPIRIN 81 MG PO CHEW
81.0000 mg | CHEWABLE_TABLET | Freq: Every day | ORAL | Status: DC
  Administered 2014-02-28 – 2014-03-01 (×2): 81 mg via ORAL
  Filled 2014-02-27 (×2): qty 1

## 2014-02-27 MED ORDER — SODIUM CHLORIDE 0.9 % IV SOLN
INTRAVENOUS | Status: DC
  Administered 2014-02-27 – 2014-02-28 (×2): via INTRAVENOUS

## 2014-02-27 MED ORDER — IPRATROPIUM-ALBUTEROL 0.5-2.5 (3) MG/3ML IN SOLN
3.0000 mL | Freq: Three times a day (TID) | RESPIRATORY_TRACT | Status: DC
  Administered 2014-02-28: 3 mL via RESPIRATORY_TRACT
  Filled 2014-02-27: qty 3

## 2014-02-27 MED ORDER — PRAVASTATIN SODIUM 40 MG PO TABS
40.0000 mg | ORAL_TABLET | Freq: Every day | ORAL | Status: DC
  Administered 2014-02-27 – 2014-02-28 (×2): 40 mg via ORAL
  Filled 2014-02-27 (×2): qty 1

## 2014-02-27 MED ORDER — HEPARIN BOLUS VIA INFUSION
2000.0000 [IU] | Freq: Once | INTRAVENOUS | Status: AC
  Administered 2014-02-27: 2000 [IU] via INTRAVENOUS
  Filled 2014-02-27: qty 2000

## 2014-02-27 MED ORDER — OXYBUTYNIN CHLORIDE 5 MG PO TABS
5.0000 mg | ORAL_TABLET | Freq: Every day | ORAL | Status: DC
  Administered 2014-02-27 – 2014-02-28 (×2): 5 mg via ORAL
  Filled 2014-02-27 (×2): qty 1

## 2014-02-27 MED ORDER — OXYCODONE HCL 5 MG PO TABS
5.0000 mg | ORAL_TABLET | Freq: Four times a day (QID) | ORAL | Status: DC | PRN
  Administered 2014-02-28 – 2014-03-01 (×3): 5 mg via ORAL
  Filled 2014-02-27 (×3): qty 1

## 2014-02-27 MED ORDER — TECHNETIUM TO 99M ALBUMIN AGGREGATED
6.0000 | Freq: Once | INTRAVENOUS | Status: AC | PRN
  Administered 2014-02-27: 6 via INTRAVENOUS

## 2014-02-27 MED ORDER — DM-GUAIFENESIN ER 30-600 MG PO TB12: 1.0000 | ORAL_TABLET | Freq: Two times a day (BID) | ORAL | Status: DC | PRN

## 2014-02-27 MED ORDER — TECHNETIUM TC 99M DIETHYLENETRIAME-PENTAACETIC ACID: 40.0000 | Freq: Once | INTRAVENOUS | Status: AC | PRN

## 2014-02-27 MED ORDER — ACETAMINOPHEN 325 MG PO TABS
650.0000 mg | ORAL_TABLET | Freq: Four times a day (QID) | ORAL | Status: DC | PRN
Start: 2014-02-27 — End: 2014-03-01
  Administered 2014-02-27 – 2014-02-28 (×2): 650 mg via ORAL
  Filled 2014-02-27 (×2): qty 2

## 2014-02-27 MED ORDER — PREDNISONE 10 MG PO TABS: 10.0000 mg | ORAL_TABLET | ORAL | Status: DC

## 2014-02-27 MED ORDER — ACETAMINOPHEN 650 MG RE SUPP: 650.0000 mg | Freq: Four times a day (QID) | RECTAL | Status: DC | PRN

## 2014-02-27 MED ORDER — TIOTROPIUM BROMIDE MONOHYDRATE 18 MCG IN CAPS
18.0000 ug | ORAL_CAPSULE | Freq: Every day | RESPIRATORY_TRACT | Status: DC
  Filled 2014-02-27 (×2): qty 5

## 2014-02-27 MED ORDER — ONDANSETRON HCL 4 MG/2ML IJ SOLN: 4.0000 mg | Freq: Four times a day (QID) | INTRAMUSCULAR | Status: DC | PRN

## 2014-02-27 MED ORDER — PREDNISONE 20 MG PO TABS
20.0000 mg | ORAL_TABLET | Freq: Every day | ORAL | Status: AC
  Administered 2014-02-28: 20 mg via ORAL
  Filled 2014-02-27 (×2): qty 1

## 2014-02-27 MED ORDER — POTASSIUM CHLORIDE CRYS ER 20 MEQ PO TBCR
40.0000 meq | EXTENDED_RELEASE_TABLET | Freq: Once | ORAL | Status: AC
  Administered 2014-02-27: 40 meq via ORAL
  Filled 2014-02-27: qty 2

## 2014-02-27 MED ORDER — POLYETHYLENE GLYCOL 3350 17 G PO PACK: 17.0000 g | PACK | Freq: Every day | ORAL | Status: DC | PRN

## 2014-02-27 MED ORDER — ALBUTEROL SULFATE (2.5 MG/3ML) 0.083% IN NEBU: 2.5000 mg | INHALATION_SOLUTION | RESPIRATORY_TRACT | Status: DC | PRN

## 2014-02-27 MED ORDER — ONDANSETRON HCL 4 MG PO TABS: 4.0000 mg | ORAL_TABLET | Freq: Four times a day (QID) | ORAL | Status: DC | PRN

## 2014-02-27 MED ORDER — SENNOSIDES-DOCUSATE SODIUM 8.6-50 MG PO TABS
2.0000 | ORAL_TABLET | Freq: Every day | ORAL | Status: DC
  Administered 2014-02-27 – 2014-02-28 (×2): 2 via ORAL
  Filled 2014-02-27 (×2): qty 2

## 2014-02-27 MED ORDER — CITALOPRAM HYDROBROMIDE 20 MG PO TABS
20.0000 mg | ORAL_TABLET | Freq: Every day | ORAL | Status: DC
  Administered 2014-02-28 – 2014-03-01 (×2): 20 mg via ORAL
  Filled 2014-02-27 (×2): qty 1

## 2014-02-27 NOTE — ED Provider Notes (Addendum)
CSN: [id number]     Arrival date & time 02/27/14  1339 History   First MD Initiated Contact with Patient 02/27/14 1342     Chief Complaint  Patient presents with  . Chest Pain     (Consider location/radiation/quality/duration/timing/severity/associated sxs/prior Treatment) Patient is a 80 y.o. female presenting with chest pain. The history is provided by the patient and the EMS personnel.  Chest Pain Pain location:  Substernal area Pain quality comment:  Heaviness Pain radiates to:  Does not radiate Pain radiates to the back: no   Pain severity:  Mild Onset quality:  Gradual Duration:  1 hour Timing:  Constant Progression:  Resolved Chronicity:  New Context: at rest   Relieved by:  Nitroglycerin (oxycodone) Worsened by:  Nothing tried Ineffective treatments:  None tried Associated symptoms: no abdominal pain, no back pain, no cough, no dizziness, no fatigue, no fever, no headache, no nausea, no shortness of breath and not vomiting     Past Medical History  Diagnosis Date  . Hypertension 10/23/2011  . Shortness of breath 04/30/2012  . Other nonspecific abnormal finding of lung field   . Contact with or exposure to unspecified communicable disease   . Cystocele, midline 04/10/2012  . Cough 04/10/2012  . Full incontinence of feces 04/10/2012  . Urgency of urination 04/10/2012  . Abdominal pain, other specified site   . Orthopnea 12/01/2011  . Type II or unspecified type diabetes mellitus without mention of complication, not stated as uncontrolled 11/16/2009  . Other and unspecified hyperlipidemia 10/20/2011  . Urinary tract infection, site not specified 09/11/2011  . Muscle weakness (generalized) 09/11/2011  . Syncope and collapse 03/29/2011  . Pain in joint, ankle and foot 12/27/2010  . Sprain of ribs 08/15/2010  . Unspecified tinnitus 07/04/2010  . Pneumonia, organism unspecified 05/04/2010  . Diverticulosis of colon (without mention of hemorrhage)   . Other and  unspecified ovarian cyst 05/04/2010  . Abdominal pain, right lower quadrant 05/02/2010  . Anxiety state, unspecified 02/14/2010  . Shortness of breath 01/05/2010  . Nausea alone 12/13/2009  . Dehydration   . Pain in joint, shoulder region 11/15/2009  . Other abnormal blood chemistry   . Pain in joint, pelvic region and thigh 07/29/2009  . Personal history of fall 07/29/2009  . Other malaise and fatigue 02/15/2009  . Obesity, unspecified 07/16/2008  . Unspecified hereditary and idiopathic peripheral neuropathy   . Reflux esophagitis 10/18/2007  . Unspecified vitamin D deficiency 07/19/2007  . Chest pain, unspecified 07/19/2007  . Varicose veins of lower extremities with inflammation 04/25/2006  . Restless legs syndrome (RLS) 01/16/2006  . Nocturia 07/07/2005  . Encounter for long-term (current) use of other medications   . Cervicalgia 01/20/2004  . Osteoporosis, unspecified 01/23/2003  . Sciatica 04/03/2001  . Routine general medical examination at a health care facility 11/15/2009  . Pain in joint, lower leg 12/11/2001  . Lumbago 02/20/2001  . Hard of hearing     both ears  . Renal disorder     last saw dr deterding 2 months ago, all new meds must be oked thru dr deterding  . Chronic kidney disease, stage III (moderate) 12/15/09  . Acute kidney failure, unspecified 12/10/2009  . Hydronephrosis 07/16/2008  . Cold   . Hypertension   . Diabetes     MANAGED WITH DIET AND EXERCISE  . Anxiety   . High cholesterol   . Depression    Past Surgical History  Procedure Laterality Date  . Appendectomy    .  Stapedectomy Bilateral [phone]    middle ear surgery  . Eye surgery Left 1988    cataract w IOL implant, Dr. Vic Ripper  . Fracture surgery Right 09/2004    distal radius/Dr. Teressa Senter  . Tonsillectomy and adenoidectomy  age 55  . Esophagogastroduodenoscopy (egd) with propofol N/A 09/03/2013    Procedure: ESOPHAGOGASTRODUODENOSCOPY (EGD) WITH PROPOFOL;  Surgeon: Willis Modena, MD;   Location: [facility] ENDOSCOPY;  Service: Endoscopy;  Laterality: N/A;  . Botox injection N/A 09/03/2013    Procedure: BOTOX INJECTION;  Surgeon: Willis Modena, MD;  Location: [facility] ENDOSCOPY;  Service: Endoscopy;  Laterality: N/A;   Family History  Problem Relation Age of Onset  . Cancer Mother     abdomen  . Cancer Father     brain, lung  . Heart disease Son    History  Substance Use Topics  . Smoking status: Never Smoker   . Smokeless tobacco: Never Used  . Alcohol Use: No   OB History    No data available     Review of Systems  Constitutional: Negative for fever and fatigue.  HENT: Negative for congestion and drooling.   Eyes: Negative for pain.  Respiratory: Negative for cough and shortness of breath.   Cardiovascular: Positive for chest pain.  Gastrointestinal: Negative for nausea, vomiting, abdominal pain and diarrhea.  Genitourinary: Negative for dysuria and hematuria.  Musculoskeletal: Negative for back pain, gait problem and neck pain.       Right thigh pain  Skin: Negative for color change.  Neurological: Negative for dizziness and headaches.  Hematological: Negative for adenopathy.  Psychiatric/Behavioral: Negative for behavioral problems.  All other systems reviewed and are negative.     Allergies  Macrodantin  Home Medications   Prior to Admission medications   Medication Sig Start Date End Date Taking? Authorizing Provider  acetaminophen (TYLENOL) 500 MG tablet Take 500 mg by mouth every 4 (four) hours as needed (pain).    Yes Historical Provider, MD  albuterol (PROVENTIL HFA;VENTOLIN HFA) 108 (90 BASE) MCG/ACT inhaler Inhale 2 puffs into the lungs every 6 (six) hours as needed for wheezing. 12/31/12  Yes Kimber Relic, MD  amLODipine (NORVASC) 5 MG tablet Take 1 tablet (5 mg total) by mouth daily. 02/21/14  Yes Calvert Cantor, MD  amphetamine-dextroamphetamine (ADDERALL) 5 MG tablet Take one tablet by mouth twice daily for ADHD 02/21/14  Yes Calvert Cantor, MD   aspirin 81 MG chewable tablet Chew 81 mg by mouth daily.   Yes Historical Provider, MD  ciprofloxacin (CIPRO) 250 MG tablet Take 250 mg by mouth 2 (two) times daily. 5 day regimen started 02/23/14, ending on 02/27/14. Has had morning dose for 12/25.   Yes Historical Provider, MD  citalopram (CELEXA) 20 MG tablet Take 1 tablet (20 mg total) by mouth daily. Patient taking differently: Take 20 mg by mouth at bedtime.  10/21/13  Yes Kimber Relic, MD  dextromethorphan-guaiFENesin [facility] DM) 30-600 MG per 12 hr tablet Take 1 tablet by mouth every 12 (twelve) hours as needed for cough (congestion).   Yes Historical Provider, MD  famotidine (PEPCID) 20 MG tablet One twice daily to suppress stomach acid Patient taking differently: Take 20 mg by mouth 2 (two) times daily.  01/27/14  Yes Kimber Relic, MD  hydrocortisone cream 1 % Apply to pruritic or irritated areas of legs twice daily as needed Patient taking differently: Apply 1 application topically 2 (two) times daily as needed for itching. Apply to pruritic or irritated areas of legs  twice daily as needed 07/30/13  Yes Kimber RelicArthur G Green, MD  ipratropium-albuterol (DUONEB) 0.5-2.5 (3) MG/3ML SOLN Take 3 mLs by nebulization 3 (three) times daily.   Yes Historical Provider, MD  lovastatin (MEVACOR) 40 MG tablet Take 40 mg by mouth every morning.   Yes Historical Provider, MD  Melatonin 3 MG CAPS Take 3 mg by mouth at bedtime as needed (Sleep).   Yes Historical Provider, MD  Misc. Devices (ROLLER WALKER) MISC Rolling Walker with seat and brakes. 11/18/12  Yes Kimber RelicArthur G Green, MD  Multiple Vitamin (MULTIVITAMIN WITH MINERALS) TABS tablet Take 1 tablet by mouth daily.   Yes Historical Provider, MD  nitroGLYCERIN (NITROSTAT) 0.4 MG SL tablet Place 0.4 mg under the tongue every 5 (five) minutes as needed for chest pain.   Yes Historical Provider, MD  omeprazole (PRILOSEC) 40 MG capsule Take 40 mg by mouth 2 (two) times daily.   Yes Historical Provider, MD   oxybutynin (DITROPAN) 5 MG tablet Take 5 mg by mouth at bedtime.   Yes Historical Provider, MD  oxyCODONE (OXY IR/ROXICODONE) 5 MG immediate release tablet Take 1 tablet (5 mg total) by mouth every 4 (four) hours as needed for breakthrough pain. 02/21/14  Yes Calvert CantorSaima Rizwan, MD  OxyCODONE (OXYCONTIN) 15 mg T12A 12 hr tablet Take 15 mg by mouth every 12 (twelve) hours.   Yes Historical Provider, MD  polyethylene glycol (MIRALAX) packet Take 17 g by mouth daily. 02/21/14  Yes Calvert CantorSaima Rizwan, MD  predniSONE (DELTASONE) 10 MG tablet Take 10 mg by mouth See admin instructions. Taper starting 02/23/14: 30mg  twice daily for 3 days (12/21-12/24) 20mg  twice daily for 2 days (12/24-12/26) 10mg  daily for 2 days   Yes Historical Provider, MD  senna-docusate (SENOKOT-S) 8.6-50 MG per tablet Take 2 tablets by mouth at bedtime. 02/21/14  Yes Calvert CantorSaima Rizwan, MD  telmisartan-hydrochlorothiazide (MICARDIS HCT) 80-12.5 MG per tablet Take 1 tablet by mouth daily. 09/17/13  Yes Nyoka CowdenMichael B Wert, MD   BP 146/69 mmHg  Pulse 69  Temp(Src) 97.9 F (36.6 C) (Oral)  Resp 18  Wt 180 lb (81.647 kg)  SpO2 93% Physical Exam  Constitutional: She is oriented to person, place, and time. She appears well-developed and well-nourished.  HENT:  Head: Normocephalic and atraumatic.  Mouth/Throat: Oropharynx is clear and moist. No oropharyngeal exudate.  Eyes: Conjunctivae and EOM are normal. Pupils are equal, round, and reactive to light.  Neck: Normal range of motion. Neck supple.  Cardiovascular: Normal rate, regular rhythm, normal heart sounds and intact distal pulses.  Exam reveals no gallop and no friction rub.   No murmur heard. Pulmonary/Chest: Effort normal and breath sounds normal. No respiratory distress. She has no wheezes.  Abdominal: Soft. Bowel sounds are normal. There is no tenderness. There is no rebound and no guarding.  Musculoskeletal: Normal range of motion. She exhibits no edema or tenderness.  Normal-appearing  symmetric lower extremities. Mild tenderness to palpation of the right medial thigh. 2+ distal pulses in the lower extremities.  Normal strength and sensation in the lower extremities.  Neurological: She is alert and oriented to person, place, and time.  Skin: Skin is warm and dry.  Psychiatric: She has a normal mood and affect. Her behavior is normal.  Nursing note and vitals reviewed.   ED Course  Procedures (including critical care time) Labs Review Labs Reviewed  CBC WITH DIFFERENTIAL - Abnormal; Notable for the following:    WBC 13.9 (*)    RBC 3.71 (*)  Hemoglobin 11.1 (*)    HCT 33.9 (*)    Neutro Abs 10.5 (*)    Monocytes Absolute 1.7 (*)    All other components within normal limits  COMPREHENSIVE METABOLIC PANEL - Abnormal; Notable for the following:    Sodium 134 (*)    Potassium 3.1 (*)    Glucose, Bld 137 (*)    BUN 61 (*)    Creatinine, Ser 1.99 (*)    Total Protein 5.4 (*)    Albumin 3.0 (*)    GFR calc non Af Amer 21 (*)    GFR calc Af Amer 24 (*)    All other components within normal limits  PROTIME-INR - Abnormal; Notable for the following:    Prothrombin Time 16.5 (*)    All other components within normal limits  D-DIMER, QUANTITATIVE - Abnormal; Notable for the following:    D-Dimer, Quant >20.00 (*)    All other components within normal limits  TROPONIN I - Abnormal; Notable for the following:    Troponin I 0.06 (*)    All other components within normal limits  BASIC METABOLIC PANEL - Abnormal; Notable for the following:    BUN 56 (*)    Creatinine, Ser 1.65 (*)    GFR calc non Af Amer 26 (*)    GFR calc Af Amer 30 (*)    All other components within normal limits  CBC - Abnormal; Notable for the following:    WBC 11.1 (*)    RBC 3.73 (*)    Hemoglobin 11.1 (*)    HCT 34.6 (*)    All other components within normal limits  PROTIME-INR - Abnormal; Notable for the following:    Prothrombin Time 16.3 (*)    All other components within normal  limits  TROPONIN I  TROPONIN I  HEPARIN LEVEL (UNFRACTIONATED)  URINALYSIS, ROUTINE W REFLEX MICROSCOPIC  TROPONIN I  HEPARIN LEVEL (UNFRACTIONATED)    Imaging Review Dg Chest 2 View  02/27/2014   CLINICAL DATA:  Mid chest pain onset today at noon, shortness of breath, nausea, history hypertension, type 2 diabetes  EXAM: CHEST  2 VIEW  COMPARISON:  02/13/2014  FINDINGS: Enlargement of cardiac silhouette.  Atherosclerotic calcification aorta.  Mediastinal contours and pulmonary vascularity normal.  Mild chronic peribronchial thickening and emphysematous changes consistent with COPD.  Chronic linear atelectasis versus scarring medial LEFT lower lobe stable.  No definite acute infiltrate, pleural effusion or pneumothorax.  Bones demineralized.  IMPRESSION: COPD changes with chronic linear subsegmental atelectasis versus scarring medial LEFT lower lobe.  Enlargement of cardiac silhouette.  Otherwise negative exam.   Electronically Signed   By: Ulyses Southward M.D.   On: 02/27/2014 15:16   Nm Pulmonary Perf And Vent  02/27/2014   CLINICAL DATA:  Shortness of breath with chest pain and elevated D-dimer.  EXAM: NUCLEAR MEDICINE VENTILATION - PERFUSION LUNG SCAN  TECHNIQUE: Ventilation images were obtained in multiple projections using inhaled aerosol technetium 99 M DTPA. Perfusion images were obtained in multiple projections after intravenous injection of Tc-65m MAA.  RADIOPHARMACEUTICALS:  40.0 mCi Tc-67m DTPA aerosol and 6.0 mCi Tc-45m MAA  COMPARISON:  Chest x-ray today.  FINDINGS: Ventilation: Examination demonstrates more than two moderate size peripheral wedge-shaped segmental defects bilaterally.  Perfusion: No wedge shaped peripheral perfusion defects to suggest acute pulmonary embolism.  Chest x-ray demonstrates no focal abnormality.  IMPRESSION: Findings compatible high probability for pulmonary embolism.   Electronically Signed   By: Elberta Fortis M.D.   On:  02/27/2014 19:24     EKG  Interpretation   Date/Time:  Friday February 27 2014 14:02:39 EST Ventricular Rate:  88 PR Interval:  135 QRS Duration: 102 QT Interval:  376 QTC Calculation: 455 R Axis:   82 Text Interpretation:  Sinus rhythm Paired ventricular premature complexes  Borderline right axis deviation Repol abnrm suggests ischemia, lateral  leads POSTERIOR ECG Confirmed by Reagyn Facemire  MD, Kayceon Oki ([id number]) on  02/27/2014 2:59:24 PM      MDM   Final diagnoses:  Chest pain  Right thigh pain  PE (pulmonary embolism)     80 y.o. female w recent admission for UTI, L2 comp fx w/ kyphoplasty who presents with right thigh pain for the last 2-3 days and central chest heaviness which began about an hour ago. EMS reports that the patient was supposed to be on her 3 L via nasal cannula at baseline but had not been on this for an unknown amount of time. She states that she has had chest pain for approximately an hour. She got 2 oxycodone at her facility prior to transfer. She got 1 nitroglycerin in route which dropped her blood pressure to systolic in the 80s. She is afebrile and vital signs are unremarkable here. She denies any chest pain currently. She has normal-appearing symmetric lower extremities with focal tenderness in the right medial thigh. We'll get screening labs and imaging.  D-dimer elevated. Discussed admission w/ hospitalist. Will order V/Q scan.     Purvis SheffieldForrest Tewana Bohlen, MD 02/28/14 [phone]  Purvis SheffieldForrest Itzel Mckibbin, MD 02/28/14 [phone]

## 2014-02-27 NOTE — Progress Notes (Signed)
ANTICOAGULATION CONSULT NOTE - Initial Consult  Pharmacy Consult for Heparin Indication: New RLE DVT  Allergies  Allergen Reactions  . Macrodantin [Nitrofurantoin Macrocrystal]     Affects kidneys    Patient Measurements:    Ht: 5\' 5"  Wt: 77 kg IBW: 57 kg Heparin Dosing Weight: 73 kg  Vital Signs: BP: 96/45 mmHg (12/25 1645) Pulse Rate: 77 (12/25 1645)  Labs:  Recent Labs  02/27/14 1353  HGB 11.1*  HCT 33.9*  PLT 198  LABPROT 16.5*  INR 1.31  CREATININE 1.99*  TROPONINI <0.03    Estimated Creatinine Clearance: 19.3 mL/min (by C-G formula based on Cr of 1.99).   Medical History: Past Medical History  Diagnosis Date  . Hypertension 10/23/2011  . Shortness of breath 04/30/2012  . Other nonspecific abnormal finding of lung field   . Contact with or exposure to unspecified communicable disease   . Cystocele, midline 04/10/2012  . Cough 04/10/2012  . Full incontinence of feces 04/10/2012  . Urgency of urination 04/10/2012  . Abdominal pain, other specified site   . Orthopnea 12/01/2011  . Type II or unspecified type diabetes mellitus without mention of complication, not stated as uncontrolled 11/16/2009  . Other and unspecified hyperlipidemia 10/20/2011  . Urinary tract infection, site not specified 09/11/2011  . Muscle weakness (generalized) 09/11/2011  . Syncope and collapse 03/29/2011  . Pain in joint, ankle and foot 12/27/2010  . Sprain of ribs 08/15/2010  . Unspecified tinnitus 07/04/2010  . Pneumonia, organism unspecified 05/04/2010  . Diverticulosis of colon (without mention of hemorrhage)   . Other and unspecified ovarian cyst 05/04/2010  . Abdominal pain, right lower quadrant 05/02/2010  . Anxiety state, unspecified 02/14/2010  . Shortness of breath 01/05/2010  . Nausea alone 12/13/2009  . Dehydration   . Pain in joint, shoulder region 11/15/2009  . Other abnormal blood chemistry   . Pain in joint, pelvic region and thigh 07/29/2009  .  Personal history of fall 07/29/2009  . Other malaise and fatigue 02/15/2009  . Obesity, unspecified 07/16/2008  . Unspecified hereditary and idiopathic peripheral neuropathy   . Reflux esophagitis 10/18/2007  . Unspecified vitamin D deficiency 07/19/2007  . Chest pain, unspecified 07/19/2007  . Varicose veins of lower extremities with inflammation 04/25/2006  . Restless legs syndrome (RLS) 01/16/2006  . Nocturia 07/07/2005  . Encounter for long-term (current) use of other medications   . Cervicalgia 01/20/2004  . Osteoporosis, unspecified 01/23/2003  . Sciatica 04/03/2001  . Routine general medical examination at a health care facility 11/15/2009  . Pain in joint, lower leg 12/11/2001  . Lumbago 02/20/2001  . Hard of hearing     both ears  . Renal disorder     last saw dr deterding 2 months ago, all new meds must be oked thru dr deterding  . Chronic kidney disease, stage III (moderate) 12/15/09  . Acute kidney failure, unspecified 12/10/2009  . Hydronephrosis 07/16/2008  . Cold   . Hypertension   . Diabetes     MANAGED WITH DIET AND EXERCISE  . Anxiety   . High cholesterol   . Depression     Assessment: [age] YOF admitted from NH with chest pain. Work-up revealed positive D-dimer and dopplers showed new RLE DVT. Pharmacy consulted to start heparin for anticoagulation. Baseline CBC shows Hgb 11.1, plts wnl. Given the patient's advanced age - will give half bolus and start conservatively.  Goal of Therapy:  Heparin level 0.3-0.7 units/ml Monitor platelets by anticoagulation protocol: Yes  Plan:  1. Heparin 2000 unit bolus x 1 2. Start Heparin at a rate of 1000 units/hr (10 ml/hr) 3. Daily heparin levels, CBC 4. Will continue to monitor for any signs/symptoms of bleeding and will follow up with heparin level in 8 hours   Georgina PillionElizabeth Nat Lowenthal, PharmD, BCPS Clinical Pharmacist Pager: [phone] 02/27/2014 5:04 PM

## 2014-02-27 NOTE — H&P (Signed)
Patient Demographics  Christine Pierce, is a 80 y.o. female  MRN: [id number]   DOB - 04/23/1921  Admit Date - 02/27/2014  Outpatient Primary MD for the patient is GREEN, Lenon Curt, MD   With History of -  Past Medical History  Diagnosis Date  . Hypertension 10/23/2011  . Shortness of breath 04/30/2012  . Other nonspecific abnormal finding of lung field   . Contact with or exposure to unspecified communicable disease   . Cystocele, midline 04/10/2012  . Cough 04/10/2012  . Full incontinence of feces 04/10/2012  . Urgency of urination 04/10/2012  . Abdominal pain, other specified site   . Orthopnea 12/01/2011  . Type II or unspecified type diabetes mellitus without mention of complication, not stated as uncontrolled 11/16/2009  . Other and unspecified hyperlipidemia 10/20/2011  . Urinary tract infection, site not specified 09/11/2011  . Muscle weakness (generalized) 09/11/2011  . Syncope and collapse 03/29/2011  . Pain in joint, ankle and foot 12/27/2010  . Sprain of ribs 08/15/2010  . Unspecified tinnitus 07/04/2010  . Pneumonia, organism unspecified 05/04/2010  . Diverticulosis of colon (without mention of hemorrhage)   . Other and unspecified ovarian cyst 05/04/2010  . Abdominal pain, right lower quadrant 05/02/2010  . Anxiety state, unspecified 02/14/2010  . Shortness of breath 01/05/2010  . Nausea alone 12/13/2009  . Dehydration   . Pain in joint, shoulder region 11/15/2009  . Other abnormal blood chemistry   . Pain in joint, pelvic region and thigh 07/29/2009  . Personal history of fall 07/29/2009  . Other malaise and fatigue 02/15/2009  . Obesity, unspecified 07/16/2008  . Unspecified hereditary and idiopathic peripheral neuropathy   . Reflux esophagitis 10/18/2007  . Unspecified vitamin D deficiency 07/19/2007  . Chest pain, unspecified 07/19/2007  . Varicose veins of lower extremities with inflammation 04/25/2006  . Restless legs syndrome (RLS) 01/16/2006  .  Nocturia 07/07/2005  . Encounter for long-term (current) use of other medications   . Cervicalgia 01/20/2004  . Osteoporosis, unspecified 01/23/2003  . Sciatica 04/03/2001  . Routine general medical examination at a health care facility 11/15/2009  . Pain in joint, lower leg 12/11/2001  . Lumbago 02/20/2001  . Hard of hearing     both ears  . Renal disorder     last saw dr deterding 2 months ago, all new meds must be oked thru dr deterding  . Chronic kidney disease, stage III (moderate) 12/15/09  . Acute kidney failure, unspecified 12/10/2009  . Hydronephrosis 07/16/2008  . Cold   . Hypertension   . Diabetes     MANAGED WITH DIET AND EXERCISE  . Anxiety   . High cholesterol   . Depression       Past Surgical History  Procedure Laterality Date  . Appendectomy    . Stapedectomy Bilateral [phone]    middle ear surgery  . Eye surgery Left 1988    cataract w IOL implant, Dr. Vic Ripper  . Fracture surgery Right 09/2004    distal radius/Dr. Teressa Senter  . Tonsillectomy and adenoidectomy  age 18  . Esophagogastroduodenoscopy (egd) with propofol N/A 09/03/2013    Procedure: ESOPHAGOGASTRODUODENOSCOPY (EGD) WITH PROPOFOL;  Surgeon: Willis Modena, MD;  Location: [facility];  Service: Endoscopy;  Laterality: N/A;  . Botox injection N/A 09/03/2013    Procedure: BOTOX INJECTION;  Surgeon: Willis Modena, MD;  Location: [facility];  Service: Endoscopy;  Laterality: N/A;    in for   Chief Complaint  Patient presents with  .  Chest Pain     HPI  Christine PillionMary Krinsky  is a 78 y.o. female, 80 year old female with history of hypertension, depression, obesity, hyperlipidemia, chronic back pain who was [facility] secondary to fall and L2 compression fracture, where she had kyphoplasty on 12/17, discharged to SNF, patient presents today secondary to complaints of chest pain, right hip pain, had negative troponins, had workup which was significant for acute  renal failure, patient appears to be clinically dehydrated, attending is 1.9 jumped from 0.9, as well given her recent history, poor ambulatory status, d-dimer was obtained which was significantly elevated, venous Dopplers and VQ scan still pending, patient had significant leukocytosis at 13.9, she just finished treatment for UTI with Augmentin, denies any fever, chills, reports generalized weakness and poor appetite.    Review of Systems    In addition to the HPI above,  No Fever-chills, No Headache, No changes with Vision or hearing, No problems swallowing food or Liquids, And planes of Chest pain, but no Cough or Shortness of Breath, No Abdominal pain, No Nausea or Vommitting, Bowel movements are regular, No Blood in stool or Urine, No dysuria, No new skin rashes or bruises, No new joints pains-aches,  No new weakness, tingling, numbness in any extremity, No recent weight gain or loss, No polyuria, polydypsia or polyphagia, No significant Mental Stressors.  A full 10 point Review of Systems was done, except as stated above, all other Review of Systems were negative.   Social History History  Substance Use Topics  . Smoking status: Never Smoker   . Smokeless tobacco: Never Used  . Alcohol Use: No     Family History Family History  Problem Relation Age of Onset  . Cancer Mother     abdomen  . Cancer Father     brain, lung  . Heart disease Son      Prior to Admission medications   Medication Sig Start Date End Date Taking? Authorizing Provider  acetaminophen (TYLENOL) 500 MG tablet Take 500 mg by mouth every 4 (four) hours as needed (pain).    Yes Historical Provider, MD  albuterol (PROVENTIL HFA;VENTOLIN HFA) 108 (90 BASE) MCG/ACT inhaler Inhale 2 puffs into the lungs every 6 (six) hours as needed for wheezing. 12/31/12  Yes Kimber RelicArthur G Green, MD  amLODipine (NORVASC) 5 MG tablet Take 1 tablet (5 mg total) by mouth daily. 02/21/14  Yes Calvert CantorSaima Rizwan, MD    amphetamine-dextroamphetamine (ADDERALL) 5 MG tablet Take one tablet by mouth twice daily for ADHD 02/21/14  Yes Calvert CantorSaima Rizwan, MD  aspirin 81 MG chewable tablet Chew 81 mg by mouth daily.   Yes Historical Provider, MD  ciprofloxacin (CIPRO) 250 MG tablet Take 250 mg by mouth 2 (two) times daily. 5 day regimen started 02/23/14, ending on 02/27/14. Has had morning dose for 12/25.   Yes Historical Provider, MD  citalopram (CELEXA) 20 MG tablet Take 1 tablet (20 mg total) by mouth daily. Patient taking differently: Take 20 mg by mouth at bedtime.  10/21/13  Yes Kimber RelicArthur G Green, MD  dextromethorphan-guaiFENesin [facility](MUCINEX DM) 30-600 MG per 12 hr tablet Take 1 tablet by mouth every 12 (twelve) hours as needed for cough (congestion).   Yes Historical Provider, MD  famotidine (PEPCID) 20 MG tablet One twice daily to suppress stomach acid Patient taking differently: Take 20 mg by mouth 2 (two) times daily.  01/27/14  Yes Kimber RelicArthur G Green, MD  hydrocortisone cream 1 % Apply to pruritic or irritated  areas of legs twice daily as needed Patient taking differently: Apply 1 application topically 2 (two) times daily as needed for itching. Apply to pruritic or irritated areas of legs twice daily as needed 07/30/13  Yes Kimber Relic, MD  ipratropium-albuterol (DUONEB) 0.5-2.5 (3) MG/3ML SOLN Take 3 mLs by nebulization 3 (three) times daily.   Yes Historical Provider, MD  lovastatin (MEVACOR) 40 MG tablet Take 40 mg by mouth every morning.   Yes Historical Provider, MD  Melatonin 3 MG CAPS Take 3 mg by mouth at bedtime as needed (Sleep).   Yes Historical Provider, MD  Misc. Devices (ROLLER WALKER) MISC Rolling Walker with seat and brakes. 11/18/12  Yes Kimber Relic, MD  Multiple Vitamin (MULTIVITAMIN WITH MINERALS) TABS tablet Take 1 tablet by mouth daily.   Yes Historical Provider, MD  nitroGLYCERIN (NITROSTAT) 0.4 MG SL tablet Place 0.4 mg under the tongue every 5 (five) minutes as needed for chest pain.   Yes  Historical Provider, MD  omeprazole (PRILOSEC) 40 MG capsule Take 40 mg by mouth 2 (two) times daily.   Yes Historical Provider, MD  oxybutynin (DITROPAN) 5 MG tablet Take 5 mg by mouth at bedtime.   Yes Historical Provider, MD  oxyCODONE (OXY IR/ROXICODONE) 5 MG immediate release tablet Take 1 tablet (5 mg total) by mouth every 4 (four) hours as needed for breakthrough pain. 02/21/14  Yes Calvert Cantor, MD  OxyCODONE (OXYCONTIN) 15 mg T12A 12 hr tablet Take 15 mg by mouth every 12 (twelve) hours.   Yes Historical Provider, MD  polyethylene glycol (MIRALAX) packet Take 17 g by mouth daily. 02/21/14  Yes Calvert Cantor, MD  predniSONE (DELTASONE) 10 MG tablet Take 10 mg by mouth See admin instructions. Taper starting 02/23/14: 30mg  twice daily for 3 days (12/21-12/24) 20mg  twice daily for 2 days (12/24-12/26) 10mg  daily for 2 days   Yes Historical Provider, MD  senna-docusate (SENOKOT-S) 8.6-50 MG per tablet Take 2 tablets by mouth at bedtime. 02/21/14  Yes Calvert Cantor, MD  telmisartan-hydrochlorothiazide (MICARDIS HCT) 80-12.5 MG per tablet Take 1 tablet by mouth daily. 09/17/13  Yes Nyoka Cowden, MD  ALPRAZolam Prudy Feeler) 0.5 MG tablet Take 1 tablet (0.5 mg total) by mouth 2 (two) times daily as needed for anxiety. Patient not taking: Reported on 02/27/2014 09/04/13   Sharon Seller, NP    Allergies  Allergen Reactions  . Macrodantin [Nitrofurantoin Macrocrystal]     Affects kidneys    Physical Exam  Vitals  Blood pressure 110/55, pulse 78, resp. rate 17, SpO2 97 %.   1. General frail elderly female lying in bed in NAD,    2. Normal affect and insight, Not Suicidal or Homicidal, Awake Alert, Oriented X 3.  3. No F.N deficits, ALL C.Nerves Intact, Strength 5/5 all 4 extremities, Sensation intact all 4 extremities, Plantars down going.  4. Ears and Eyes appear Normal, Conjunctivae clear, PERRLA. Dry Oral Mucosa.  5. Supple Neck, No JVD, No cervical lymphadenopathy appriciated, No  Carotid Bruits.  6. Symmetrical Chest wall movement, Good air movement bilaterally, CTAB.  7. RRR, No Gallops, Rubs or Murmurs, No Parasternal Heave.  8. Positive Bowel Sounds, Abdomen Soft, No tenderness, No organomegaly appriciated,No rebound -guarding or rigidity.  9.  No Cyanosis, Normal Skin Turgor, No Skin Rash or Bruise.  10. Good muscle tone,  joints appear normal , no effusions, Normal ROM.  11. No Palpable Lymph Nodes in Neck or Axillae    Data Review  CBC  Recent  Labs Lab 02/27/14 1353  WBC 13.9*  HGB 11.1*  HCT 33.9*  PLT 198  MCV 91.4  MCH 29.9  MCHC 32.7  RDW 13.9  LYMPHSABS 1.6  MONOABS 1.7*  EOSABS 0.1  BASOSABS 0.0   ------------------------------------------------------------------------------------------------------------------  Chemistries   Recent Labs Lab 02/27/14 1353  NA 134*  K 3.1*  CL 99  CO2 22  GLUCOSE 137*  BUN 61*  CREATININE 1.99*  CALCIUM 8.5  AST 23  ALT 20  ALKPHOS 72  BILITOT 0.7   ------------------------------------------------------------------------------------------------------------------ estimated creatinine clearance is 19.3 mL/min (by C-G formula based on Cr of 1.99). ------------------------------------------------------------------------------------------------------------------ No results for input(s): TSH, T4TOTAL, T3FREE, THYROIDAB in the last 72 hours.  Invalid input(s): FREET3   Coagulation profile  Recent Labs Lab 02/27/14 1353  INR 1.31   -------------------------------------------------------------------------------------------------------------------  Recent Labs  02/27/14 1353  DDIMER >20.00*   -------------------------------------------------------------------------------------------------------------------  Cardiac Enzymes  Recent Labs Lab 02/27/14 1353  TROPONINI <0.03    ------------------------------------------------------------------------------------------------------------------ Invalid input(s): POCBNP   ---------------------------------------------------------------------------------------------------------------  Urinalysis    Component Value Date/Time   COLORURINE YELLOW 02/19/2014 2226   APPEARANCEUR CLOUDY* 02/19/2014 2226   LABSPEC 1.016 02/19/2014 2226   PHURINE 6.0 02/19/2014 2226   GLUCOSEU NEGATIVE 02/19/2014 2226   HGBUR NEGATIVE 02/19/2014 2226   BILIRUBINUR NEGATIVE 02/19/2014 2226   BILIRUBINUR Negative 01/27/2014 1712   KETONESUR 15* 02/19/2014 2226   PROTEINUR NEGATIVE 02/19/2014 2226   UROBILINOGEN 0.2 02/19/2014 2226   NITRITE NEGATIVE 02/19/2014 2226   NITRITE Positive* 01/27/2014 1712   LEUKOCYTESUR SMALL* 02/19/2014 2226   LEUKOCYTESUR 2+* 01/27/2014 1712    ----------------------------------------------------------------------------------------------------------------  Imaging results:   Dg Chest 2 View  02/27/2014   CLINICAL DATA:  Mid chest pain onset today at noon, shortness of breath, nausea, history hypertension, type 2 diabetes  EXAM: CHEST  2 VIEW  COMPARISON:  02/13/2014  FINDINGS: Enlargement of cardiac silhouette.  Atherosclerotic calcification aorta.  Mediastinal contours and pulmonary vascularity normal.  Mild chronic peribronchial thickening and emphysematous changes consistent with COPD.  Chronic linear atelectasis versus scarring medial LEFT lower lobe stable.  No definite acute infiltrate, pleural effusion or pneumothorax.  Bones demineralized.  IMPRESSION: COPD changes with chronic linear subsegmental atelectasis versus scarring medial LEFT lower lobe.  Enlargement of cardiac silhouette.  Otherwise negative exam.   Electronically Signed   By: Ulyses Southward M.D.   On: 02/27/2014 15:16        Assessment & Plan  Principal Problem:   Acute renal failure Active Problems:   Chest pain   COPD  (chronic obstructive pulmonary disease)   Compression fracture of L2   Leukocytosis   Right hip pain   Chest pain, with positive d-dimer. -Patient is high risk for DVT/PE, given recent hospitalization, and poor ambulatory status, significantly elevated d-dimer's, will obtain VQ scan, venous Dopplers (right thigh pain, possible DVT), requested Dopplers to be done stat, but unclear if it will be done today or not , so patient will be started empirically on IV heparin for anticoagulation given her high risk for DVT/PE. -Monitor on telemetry. -Continue to cycle cardiac enzymes. -Patient already received aspirin  Acute renal failure -Most likely dehydration and volume depleted. -We'll hold Micardis -Continue with IV fluids, monitor BMP closely  Leukocytosis -Unclear if it is infectious process versus a steroids. -Chest x-ray does not show any infectious process, she is a febrile, will check urinalysis.  Hypertension -Blood pressure is on the lower side so would hold all meds  Compression fracture of L2 with  recent kyphoplasty - when necessary pain meds -PT/OT consult   DVT Prophylaxis on heparin drip. AM Labs Ordered, also please review Full Orders  Family Communication: Admission, patients condition and plan of care including tests being ordered have been discussed with the patient and  who indicate understanding and agree with the plan and Code Status.  Code Status DNR, HPOA is Jani FilesGruby, Clarisse  Likely DC to  SNF   Condition GUARDED    Time spent in minutes : 60 minutes    Authur Cubit M.D on 02/27/2014 at 4:27 PM  Between 7am to 7pm - Pager - [phone]  After 7pm go to [email or web address] - password [email or web address]  And look for the night coverage person covering me after hours  [facility] Office  [phone]   **Disclaimer: This note may have been dictated with voice recognition software. Similar sounding words can inadvertently be transcribed and this note  may contain transcription errors which may not have been corrected upon publication of note.**

## 2014-02-27 NOTE — ED Notes (Signed)
Attempted report 

## 2014-02-27 NOTE — ED Notes (Signed)
Pt POA reports that pt does not have hx of wearing oxygen and has been on 2L Nasal cannula since being at [facility]. Oxygen removed from pt to see what oxygen saturation read and pt decreased to 88%, pt placed back on 2L nasal cannula and sats increased to 98%

## 2014-02-27 NOTE — ED Notes (Signed)
[facility], pt c/o mid CP onset today @ noon, with SOB & Nausea, denies v/d, pt rcvd  Oxycotin 15 mg & Oxycodone 5 mg PTA, pt rcvd 81 mg ASA. Pt given 500 cc bolus and 1 nitro PTA, currently denies any pain at this time. Axo x4. nad noted.

## 2014-02-27 NOTE — ED Notes (Signed)
Spoke to admitting and states pt okay to go upstairs before NM scan.

## 2014-02-27 NOTE — Progress Notes (Signed)
Bilateral lower extremity venous duplex completed.  Right:  DVT noted in the popliteal vein.  No evidence of superficial thrombosis.  No Baker's cyst.  Left:  No evidence of DVT, superficial thrombosis, or Baker's cyst.   

## 2014-02-28 DIAGNOSIS — D72829 Elevated white blood cell count, unspecified: Secondary | ICD-10-CM

## 2014-02-28 LAB — TROPONIN I
Troponin I: 0.03 ng/mL (ref <0.031)
Troponin I: 0.03 ng/mL (ref <0.031)

## 2014-02-28 LAB — BASIC METABOLIC PANEL
Anion gap: 8 (ref 5–15)
BUN: 56 mg/dL — ABNORMAL HIGH (ref 6–23)
CO2: 25 mmol/L (ref 19–32)
Calcium: 8.7 mg/dL (ref 8.4–10.5)
Chloride: 102 mEq/L (ref 96–112)
Creatinine, Ser: 1.65 mg/dL — ABNORMAL HIGH (ref 0.50–1.10)
GFR, EST AFRICAN AMERICAN: 30 mL/min — AB (ref >90)
GFR, EST NON AFRICAN AMERICAN: 26 mL/min — AB (ref >90)
Glucose, Bld: 98 mg/dL (ref 70–99)
POTASSIUM: 4.1 mmol/L (ref 3.5–5.1)
Sodium: 135 mmol/L (ref 135–145)

## 2014-02-28 LAB — URINALYSIS, ROUTINE W REFLEX MICROSCOPIC
Bilirubin Urine: NEGATIVE
GLUCOSE, UA: NEGATIVE mg/dL
Hgb urine dipstick: NEGATIVE
KETONES UR: NEGATIVE mg/dL
LEUKOCYTES UA: NEGATIVE
Nitrite: NEGATIVE
PROTEIN: NEGATIVE mg/dL
Specific Gravity, Urine: 1.015 (ref 1.005–1.030)
Urobilinogen, UA: 0.2 mg/dL (ref 0.0–1.0)
pH: 5.5 (ref 5.0–8.0)

## 2014-02-28 LAB — CBC
HCT: 34.6 % — ABNORMAL LOW (ref 36.0–46.0)
Hemoglobin: 11.1 g/dL — ABNORMAL LOW (ref 12.0–15.0)
MCH: 29.8 pg (ref 26.0–34.0)
MCHC: 32.1 g/dL (ref 30.0–36.0)
MCV: 92.8 fL (ref 78.0–100.0)
PLATELETS: 184 10*3/uL (ref 150–400)
RBC: 3.73 MIL/uL — ABNORMAL LOW (ref 3.87–5.11)
RDW: 14.1 % (ref 11.5–15.5)
WBC: 11.1 10*3/uL — ABNORMAL HIGH (ref 4.0–10.5)

## 2014-02-28 LAB — PROTIME-INR
INR: 1.29 (ref 0.00–1.49)
PROTHROMBIN TIME: 16.3 s — AB (ref 11.6–15.2)

## 2014-02-28 LAB — HEPARIN LEVEL (UNFRACTIONATED)
HEPARIN UNFRACTIONATED: 0.34 [IU]/mL (ref 0.30–0.70)
Heparin Unfractionated: 0.48 IU/mL (ref 0.30–0.70)

## 2014-02-28 MED ORDER — PETROLATUM WHITE EX OINT
1.0000 "application " | TOPICAL_OINTMENT | CUTANEOUS | Status: DC | PRN
  Filled 2014-02-28: qty 1

## 2014-02-28 MED ORDER — RIVAROXABAN 15 MG PO TABS
15.0000 mg | ORAL_TABLET | Freq: Two times a day (BID) | ORAL | Status: DC
  Administered 2014-02-28 – 2014-03-01 (×2): 15 mg via ORAL
  Filled 2014-02-28 (×2): qty 1

## 2014-02-28 MED ORDER — WHITE PETROLATUM GEL: Status: DC | PRN

## 2014-02-28 MED ORDER — RIVAROXABAN 20 MG PO TABS: 20.0000 mg | ORAL_TABLET | Freq: Every day | ORAL | Status: DC

## 2014-02-28 MED ORDER — IPRATROPIUM-ALBUTEROL 0.5-2.5 (3) MG/3ML IN SOLN: 3.0000 mL | RESPIRATORY_TRACT | Status: DC | PRN

## 2014-02-28 NOTE — Progress Notes (Signed)
ANTICOAGULATION CONSULT NOTE  Pharmacy Consult for Heparin Indication: New RLE DVT  Allergies  Allergen Reactions  . Macrodantin [Nitrofurantoin Macrocrystal]     Affects kidneys    Patient Measurements:    Ht: 5\' 5"  Wt: 77 kg IBW: 57 kg Heparin Dosing Weight: 73 kg  Vital Signs: Temp: 98.4 F (36.9 C) (12/25 1940) Temp Source: Oral (12/25 1940) BP: 119/50 mmHg (12/25 1940) Pulse Rate: 73 (12/25 1940)  Labs:  Recent Labs  02/27/14 1353 02/27/14 1945 02/28/14 0155  HGB 11.1*  --  11.1*  HCT 33.9*  --  34.6*  PLT 198  --  184  LABPROT 16.5*  --  16.3*  INR 1.31  --  1.29  HEPARINUNFRC  --   --  0.48  CREATININE 1.99*  --  1.65*  TROPONINI <0.03 0.06* 0.03    CrCl cannot be calculated (Unknown ideal weight.).  Assessment: 80 YO female with DVT for heparin  Goal of Therapy:  Heparin level 0.3-0.7 units/ml Monitor platelets by anticoagulation protocol: Yes   Plan:  Continue Heparin at current rate  Recheck level in 6 hrs to verify  Geannie RisenGreg Rhegan Trunnell, PharmD, BCPS   02/28/2014 3:17 AM

## 2014-02-28 NOTE — Progress Notes (Signed)
Patient Demographics  Christine Pierce, is a 80 y.o. Pierce, DOB - Mar 12, 1921, ZOX:[id number]  Admit date - 02/27/2014   Admitting Physician Huey Bienenstock, MD  Outpatient Primary MD for the patient is GREEN, Lenon Curt, MD  LOS - 1   Chief Complaint  Patient presents with  . Chest Pain      Admission history of present illness/brief narrative: Christine Pierce is a 80 y.o. Pierce, 80 year old Pierce with history of hypertension, depression, obesity, hyperlipidemia, chronic back pain who was recently discharged on 02/21/14 from [facility] secondary to fall and L2 compression fracture, where she had kyphoplasty on 12/17, discharged to SNF, patient presents today secondary to complaints of chest pain, right hip pain, had negative troponins, had workup which was significant for acute renal failure, patient appears to be clinically dehydrated on admission, creatinine increased from 0.9-1.99 on admission, as well given her recent history, poor ambulatory status, d-dimer was obtained which was significantly elevated, venous Dopplers and VQ scan was done, did show evidence of PE and acute DVT, so patient was started on IV heparin which is being transitioned to oral Xarelto..   Subjective:   [facility] today has, No headache, No chest pain, No abdominal pain - No Nausea, No new weakness tingling or numbness, No Cough - SOB.  Assessment & Plan    Principal Problem:   Acute renal failure Active Problems:   Chest pain   COPD (chronic obstructive pulmonary disease)   Compression fracture of L2   Leukocytosis   Right hip pain  Acute DVT and PE: -Patient is on heparin drip, will transition her to Xarelto -Monitor on telemetry.  Acute renal failure -Most likely dehydration and volume depleted. -We'll hold Micardis -Continue with IV fluids, monitor BMP  closely -Continues to improve  Leukocytosis -Unclear if it is infectious process versus a steroids. -Chest x-ray does not show any infectious process, she is a febrile, will check urinalysis. -Improving  Hypertension -Resume home meds when stable  Compression fracture of L2 with recent kyphoplasty - when necessary pain meds -PT/OT consult  Code Status: DO NOT RESUSCITATE  Family Communication: None at bedside  Disposition Plan: Back to subacute rehabilitation in 24 hours   Procedures ** VQ scan is active for PE Venous Doppler positive for right popliteal vein DVT   Consults   None   Medications  Scheduled Meds: . amphetamine-dextroamphetamine  5 mg Oral BID  . aspirin  81 mg Oral Daily  . citalopram  20 mg Oral Daily  . multivitamin with minerals  1 tablet Oral Daily  . oxybutynin  5 mg Oral QHS  . pantoprazole  80 mg Oral Daily  . polyethylene glycol  17 g Oral Daily  . potassium chloride  40 mEq Oral Once  . pravastatin  40 mg Oral q1800  . [START ON 03/01/2014] predniSONE  10 mg Oral Q breakfast  . senna-docusate  2 tablet Oral QHS  . tiotropium  18 mcg Inhalation Daily   Continuous Infusions: . sodium chloride 75 mL/hr at 02/28/14 1027  . heparin 1,050 Units/hr (02/28/14 1121)   PRN Meds:.acetaminophen **OR** acetaminophen, bisacodyl, dextromethorphan-guaiFENesin, ipratropium-albuterol, nitroGLYCERIN, ondansetron **OR** ondansetron (ZOFRAN) IV, oxyCODONE, polyethylene glycol, white petrolatum  DVT Prophylaxis on heparin drip  Lab Results  Component Value Date   PLT 184 02/28/2014    Antibiotics    Anti-infectives    None          Objective:   Filed Vitals:   02/27/14 1940 02/28/14 0539 02/28/14 0914 02/28/14 1355  BP: 119/50 146/69  132/55  Pulse: 73 69  82  Temp: 98.4 F (36.9 C) 97.9 F (36.6 C)  98.9 F (37.2 C)  TempSrc: Oral Oral  Oral  Resp: 18 18  18   Weight:  81.647 kg (180 lb)    SpO2: 100% 93% 99% 96%    Wt Readings  from Last 3 Encounters:  02/28/14 81.647 kg (180 lb)  02/13/14 77.021 kg (169 lb 12.8 oz)  01/27/14 77.111 kg (170 lb)     Intake/Output Summary (Last 24 hours) at 02/28/14 1447 Last data filed at 02/28/14 1315  Gross per 24 hour  Intake 1151.25 ml  Output      0 ml  Net 1151.25 ml     Physical Exam  Awake Alert, Oriented X 3, No new F.N deficits, Normal affect [place].AT,PERRAL Supple Neck,No JVD, No cervical lymphadenopathy appriciated.  Symmetrical Chest wall movement, Good air movement bilaterally, CTAB RRR,No Gallops,Rubs or new Murmurs, No Parasternal Heave +ve B.Sounds, Abd Soft, No tenderness, No organomegaly appriciated, No rebound - guarding or rigidity. No Cyanosis, Clubbing or edema, No new Rash or bruise     Data Review   Micro Results No results found for this or any previous visit (from the past 240 hour(s)).  Radiology Reports Dg Chest 2 View  02/27/2014   CLINICAL DATA:  Mid chest pain onset today at noon, shortness of breath, nausea, history hypertension, type 2 diabetes  EXAM: CHEST  2 VIEW  COMPARISON:  02/13/2014  FINDINGS: Enlargement of cardiac silhouette.  Atherosclerotic calcification aorta.  Mediastinal contours and pulmonary vascularity normal.  Mild chronic peribronchial thickening and emphysematous changes consistent with COPD.  Chronic linear atelectasis versus scarring medial LEFT lower lobe stable.  No definite acute infiltrate, pleural effusion or pneumothorax.  Bones demineralized.  IMPRESSION: COPD changes with chronic linear subsegmental atelectasis versus scarring medial LEFT lower lobe.  Enlargement of cardiac silhouette.  Otherwise negative exam.   Electronically Signed   By: Ulyses SouthwardMark  Boles M.D.   On: 02/27/2014 15:16   Nm Pulmonary Perf And Vent  02/27/2014   CLINICAL DATA:  Shortness of breath with chest pain and elevated D-dimer.  EXAM: NUCLEAR MEDICINE VENTILATION - PERFUSION LUNG SCAN  TECHNIQUE: Ventilation images were obtained in multiple  projections using inhaled aerosol technetium 99 M DTPA. Perfusion images were obtained in multiple projections after intravenous injection of Tc-4445m MAA.  RADIOPHARMACEUTICALS:  40.0 mCi Tc-3045m DTPA aerosol and 6.0 mCi Tc-7045m MAA  COMPARISON:  Chest x-ray today.  FINDINGS: Ventilation: Examination demonstrates more than two moderate size peripheral wedge-shaped segmental defects bilaterally.  Perfusion: No wedge shaped peripheral perfusion defects to suggest acute pulmonary embolism.  Chest x-ray demonstrates no focal abnormality.  IMPRESSION: Findings compatible high probability for pulmonary embolism.   Electronically Signed   By: Elberta Fortisaniel  Boyle M.D.   On: 02/27/2014 19:24    CBC  Recent Labs Lab 02/27/14 1353 02/28/14 0155  WBC 13.9* 11.1*  HGB 11.1* 11.1*  HCT 33.9* 34.6*  PLT 198 184  MCV 91.4 92.8  MCH Christine.9 Christine.8  MCHC 32.7 32.1  RDW 13.9 14.1  LYMPHSABS 1.6  --   MONOABS 1.7*  --   EOSABS 0.1  --   BASOSABS  0.0  --     Chemistries   Recent Labs Lab 02/27/14 1353 02/28/14 0155  NA 134* 135  K 3.1* 4.1  CL 99 102  CO2 22 25  GLUCOSE 137* 98  BUN 61* 56*  CREATININE 1.99* 1.65*  CALCIUM 8.5 8.7  AST 23  --   ALT 20  --   ALKPHOS 72  --   BILITOT 0.7  --    ------------------------------------------------------------------------------------------------------------------ estimated creatinine clearance is 23.9 mL/min (by C-G formula based on Cr of 1.65). ------------------------------------------------------------------------------------------------------------------ No results for input(s): HGBA1C in the last 72 hours. ------------------------------------------------------------------------------------------------------------------ No results for input(s): CHOL, HDL, LDLCALC, TRIG, CHOLHDL, LDLDIRECT in the last 72 hours. ------------------------------------------------------------------------------------------------------------------ No results for input(s): TSH,  T4TOTAL, T3FREE, THYROIDAB in the last 72 hours.  Invalid input(s): FREET3 ------------------------------------------------------------------------------------------------------------------ No results for input(s): VITAMINB12, FOLATE, FERRITIN, TIBC, IRON, RETICCTPCT in the last 72 hours.  Coagulation profile  Recent Labs Lab 02/27/14 1353 02/28/14 0155  INR 1.31 1.Christine     Recent Labs  02/27/14 1353  DDIMER >20.00*    Cardiac Enzymes  Recent Labs Lab 02/27/14 1945 02/28/14 0155 02/28/14 0918  TROPONINI 0.06* 0.03 0.03   ------------------------------------------------------------------------------------------------------------------ Invalid input(s): POCBNP     Time Spent in minutes   30 minutes   Kaion Tisdale M.D on 02/28/2014 at 2:47 PM  Between 7am to 7pm - Pager - [phone]  After 7pm go to [email or web address] - password [email or web address]  And look for the night coverage person covering for me after hours  [facility] Office  [phone]   **Disclaimer: This note may have been dictated with voice recognition software. Similar sounding words can inadvertently be transcribed and this note may contain transcription errors which may not have been corrected upon publication of note.**

## 2014-02-28 NOTE — Progress Notes (Signed)
PT Cancellation Note  Patient Details Name: Christine GuilesMary J Mccloud MRN: [id number] DOB: [birth year]   Cancelled Treatment:    Reason Eval/Treat Not Completed: Medical issues which prohibited therapy.  Pt with new onset PE and DVT.  Will return to evaluate 12/27 in am. Nurse in agreement.  Thanks.    Tawni MillersWhite, Batina Dougan F 02/28/2014, 3:20 PM Entergy CorporationDawn Zanyla Klebba,PT Acute Rehabilitation [phone] (pager)

## 2014-02-28 NOTE — Progress Notes (Addendum)
ANTICOAGULATION CONSULT NOTE - Follow Up Consult  Pharmacy Consult for heparin Indication: new RLE DVT  Allergies  Allergen Reactions  . Macrodantin [Nitrofurantoin Macrocrystal]     Affects kidneys    Patient Measurements: Weight: 180 lb (81.647 kg)  IBW: 57kg Heparin Dosing Weight: 74kg  Vital Signs: Temp: 97.9 F (36.6 C) (12/26 0539) Temp Source: Oral (12/26 0539) BP: 146/69 mmHg (12/26 0539) Pulse Rate: 69 (12/26 0539)  Labs:  Recent Labs  02/27/14 1353 02/27/14 1945 02/28/14 0155 02/28/14 0918  HGB 11.1*  --  11.1*  --   HCT 33.9*  --  34.6*  --   PLT 198  --  184  --   LABPROT 16.5*  --  16.3*  --   INR 1.31  --  1.29  --   HEPARINUNFRC  --   --  0.48 0.34  CREATININE 1.99*  --  1.65*  --   TROPONINI <0.03 0.06* 0.03 0.03    Estimated Creatinine Clearance: 23.9 mL/min (by C-G formula based on Cr of 1.65).   Medications:  Infusions:  . sodium chloride 75 mL/hr at 02/28/14 1027  . heparin 1,000 Units/hr (02/27/14 2018)    Assessment: 80 yo F with RLE DVT on heparin per pharmacy. Heparin level therapeutic x 2, however recent level dropped from first level and is on low end of therapeutic at 0.34. Hgb 11.1, plts 184, no bleeding noted.  Goal of Therapy:  Heparin level 0.3-0.7 units/ml Monitor platelets by anticoagulation protocol: Yes   Plan:  Increase heparin drip to 1050 units/hr to avoid further drop 8hr HL @ 1930 Daily HL/CBC Monitor s/sx of bleeding  Thank you for allowing pharmacy to be part of this patient's care team  Carly M. Sabat, Pharm.D Clinical Pharmacy Resident Pager: [phone] 02/28/2014 .11:15 AM    Addendum: Transitioning from Heparin to Xarelto for treatment of new RLE DVT. CrCl ~ 34 mL/min based on actual body weight and SCr. Renal fx seems to be improving  Plan: -Start Xarelto 15 mg twice daily x 21 days. Stop heparin infusion prior to first dose  -Transition to Xarelto 20 mg once daily on day 22 -Monitor renal fx  while admitted and s/s of bleeding -Provide Xarelto patient education  Vinnie LevelBenjamin Ahonesty Woodfin, PharmD., BCPS Clinical Pharmacist Pager [phone]

## 2014-03-01 DIAGNOSIS — I517 Cardiomegaly: Secondary | ICD-10-CM | POA: Diagnosis not present

## 2014-03-01 DIAGNOSIS — I2699 Other pulmonary embolism without acute cor pulmonale: Secondary | ICD-10-CM | POA: Diagnosis not present

## 2014-03-01 DIAGNOSIS — M79661 Pain in right lower leg: Secondary | ICD-10-CM | POA: Diagnosis not present

## 2014-03-01 DIAGNOSIS — N182 Chronic kidney disease, stage 2 (mild): Secondary | ICD-10-CM | POA: Diagnosis not present

## 2014-03-01 DIAGNOSIS — M6281 Muscle weakness (generalized): Secondary | ICD-10-CM | POA: Diagnosis not present

## 2014-03-01 DIAGNOSIS — K5900 Constipation, unspecified: Secondary | ICD-10-CM | POA: Diagnosis not present

## 2014-03-01 DIAGNOSIS — R4182 Altered mental status, unspecified: Secondary | ICD-10-CM | POA: Diagnosis not present

## 2014-03-01 DIAGNOSIS — D649 Anemia, unspecified: Secondary | ICD-10-CM | POA: Diagnosis not present

## 2014-03-01 DIAGNOSIS — E1129 Type 2 diabetes mellitus with other diabetic kidney complication: Secondary | ICD-10-CM | POA: Diagnosis not present

## 2014-03-01 DIAGNOSIS — R079 Chest pain, unspecified: Secondary | ICD-10-CM | POA: Diagnosis not present

## 2014-03-01 DIAGNOSIS — I2609 Other pulmonary embolism with acute cor pulmonale: Secondary | ICD-10-CM | POA: Diagnosis not present

## 2014-03-01 DIAGNOSIS — A491 Streptococcal infection, unspecified site: Secondary | ICD-10-CM | POA: Diagnosis not present

## 2014-03-01 DIAGNOSIS — N390 Urinary tract infection, site not specified: Secondary | ICD-10-CM | POA: Diagnosis not present

## 2014-03-01 DIAGNOSIS — R278 Other lack of coordination: Secondary | ICD-10-CM | POA: Diagnosis not present

## 2014-03-01 DIAGNOSIS — M25551 Pain in right hip: Secondary | ICD-10-CM | POA: Diagnosis not present

## 2014-03-01 DIAGNOSIS — S32020D Wedge compression fracture of second lumbar vertebra, subsequent encounter for fracture with routine healing: Secondary | ICD-10-CM | POA: Diagnosis not present

## 2014-03-01 DIAGNOSIS — I82401 Acute embolism and thrombosis of unspecified deep veins of right lower extremity: Secondary | ICD-10-CM | POA: Diagnosis not present

## 2014-03-01 DIAGNOSIS — J449 Chronic obstructive pulmonary disease, unspecified: Secondary | ICD-10-CM | POA: Diagnosis not present

## 2014-03-01 DIAGNOSIS — I10 Essential (primary) hypertension: Secondary | ICD-10-CM | POA: Diagnosis not present

## 2014-03-01 DIAGNOSIS — M5430 Sciatica, unspecified side: Secondary | ICD-10-CM | POA: Diagnosis not present

## 2014-03-01 DIAGNOSIS — R260 Ataxic gait: Secondary | ICD-10-CM | POA: Diagnosis not present

## 2014-03-01 DIAGNOSIS — I82409 Acute embolism and thrombosis of unspecified deep veins of unspecified lower extremity: Secondary | ICD-10-CM

## 2014-03-01 DIAGNOSIS — R339 Retention of urine, unspecified: Secondary | ICD-10-CM | POA: Diagnosis not present

## 2014-03-01 DIAGNOSIS — W19XXXA Unspecified fall, initial encounter: Secondary | ICD-10-CM | POA: Diagnosis not present

## 2014-03-01 DIAGNOSIS — N179 Acute kidney failure, unspecified: Secondary | ICD-10-CM | POA: Diagnosis not present

## 2014-03-01 DIAGNOSIS — F909 Attention-deficit hyperactivity disorder, unspecified type: Secondary | ICD-10-CM | POA: Diagnosis not present

## 2014-03-01 DIAGNOSIS — M4806 Spinal stenosis, lumbar region: Secondary | ICD-10-CM | POA: Diagnosis not present

## 2014-03-01 DIAGNOSIS — R0781 Pleurodynia: Secondary | ICD-10-CM | POA: Diagnosis not present

## 2014-03-01 DIAGNOSIS — M79651 Pain in right thigh: Secondary | ICD-10-CM | POA: Diagnosis not present

## 2014-03-01 DIAGNOSIS — R279 Unspecified lack of coordination: Secondary | ICD-10-CM | POA: Diagnosis not present

## 2014-03-01 DIAGNOSIS — R1310 Dysphagia, unspecified: Secondary | ICD-10-CM | POA: Diagnosis not present

## 2014-03-01 LAB — BASIC METABOLIC PANEL
Anion gap: 7 (ref 5–15)
BUN: 28 mg/dL — ABNORMAL HIGH (ref 6–23)
CALCIUM: 8.8 mg/dL (ref 8.4–10.5)
CO2: 24 mmol/L (ref 19–32)
Chloride: 105 mEq/L (ref 96–112)
Creatinine, Ser: 1.03 mg/dL (ref 0.50–1.10)
GFR, EST AFRICAN AMERICAN: 54 mL/min — AB (ref >90)
GFR, EST NON AFRICAN AMERICAN: 46 mL/min — AB (ref >90)
Glucose, Bld: 103 mg/dL — ABNORMAL HIGH (ref 70–99)
POTASSIUM: 4.7 mmol/L (ref 3.5–5.1)
Sodium: 136 mmol/L (ref 135–145)

## 2014-03-01 LAB — URINALYSIS, ROUTINE W REFLEX MICROSCOPIC
BILIRUBIN URINE: NEGATIVE
Glucose, UA: NEGATIVE mg/dL
Hgb urine dipstick: NEGATIVE
KETONES UR: NEGATIVE mg/dL
Leukocytes, UA: NEGATIVE
Nitrite: NEGATIVE
Protein, ur: NEGATIVE mg/dL
Specific Gravity, Urine: 1.015 (ref 1.005–1.030)
UROBILINOGEN UA: 1 mg/dL (ref 0.0–1.0)
pH: 6.5 (ref 5.0–8.0)

## 2014-03-01 LAB — CBC
HEMATOCRIT: 33.9 % — AB (ref 36.0–46.0)
Hemoglobin: 10.8 g/dL — ABNORMAL LOW (ref 12.0–15.0)
MCH: 29.8 pg (ref 26.0–34.0)
MCHC: 31.9 g/dL (ref 30.0–36.0)
MCV: 93.4 fL (ref 78.0–100.0)
Platelets: 227 10*3/uL (ref 150–400)
RBC: 3.63 MIL/uL — ABNORMAL LOW (ref 3.87–5.11)
RDW: 14 % (ref 11.5–15.5)
WBC: 13.2 10*3/uL — ABNORMAL HIGH (ref 4.0–10.5)

## 2014-03-01 MED ORDER — RIVAROXABAN 15 MG PO TABS: 15.0000 mg | ORAL_TABLET | Freq: Two times a day (BID) | ORAL | Status: DC

## 2014-03-01 MED ORDER — POLYETHYLENE GLYCOL 3350 17 G PO PACK: 17.0000 g | PACK | Freq: Every day | ORAL | Status: DC | PRN

## 2014-03-01 MED ORDER — OXYCODONE HCL 5 MG PO TABS: 5.0000 mg | ORAL_TABLET | Freq: Four times a day (QID) | ORAL | Status: DC | PRN

## 2014-03-01 MED ORDER — OXYCODONE HCL ER 15 MG PO T12A: 15.0000 mg | EXTENDED_RELEASE_TABLET | Freq: Two times a day (BID) | ORAL | Status: DC

## 2014-03-01 MED ORDER — BISACODYL 10 MG RE SUPP: 10.0000 mg | Freq: Every day | RECTAL | Status: DC | PRN

## 2014-03-01 MED ORDER — AMPHETAMINE-DEXTROAMPHETAMINE 5 MG PO TABS: ORAL_TABLET | ORAL | Status: DC

## 2014-03-01 MED ORDER — POLYETHYLENE GLYCOL 3350 17 G PO PACK: 17.0000 g | PACK | Freq: Two times a day (BID) | ORAL | Status: AC

## 2014-03-01 MED ORDER — RIVAROXABAN 20 MG PO TABS: 20.0000 mg | ORAL_TABLET | Freq: Every day | ORAL | Status: DC

## 2014-03-01 NOTE — Clinical Social Work Note (Signed)
CSW made aware by Dr. Waldron Labs patient ready for d/c to [facility] contacted facility and spoke with Sharyn Lull, who confirmed bed availability. Sharyn Lull requested family come to facility at 1:30pm to sign admission paperwork. CSW met with patient who was alert and oriented. Patient was pleasant and stated she doesn't know if she's going or coming, but she's happy to be going back to facility. Patient requested CSW contact her [facility]) to make her aware of d/c. CSW contacted [facility] [phone]) and made her aware of d/c plan. Clarisse is agreeable to plan and will go to facility and sign paperwork. Clarisse is also agreeable to ambulance transport to facility. CSW made [facility] aware. CSW faxed d/c summary and FL2 to facility. CSW prepared d/c packet and placed in patient's shadow chart. CSW made RN Marcie Bal) aware of number to call for report: [phone] and room number: 841. CSW to arrange transportation via [facility]. No further needs. CSW signing off.  [place], [place] Weekend Clinical Social Worker [phone]

## 2014-03-01 NOTE — Discharge Summary (Signed)
Christine Pierce, 80 y.o., DOB 24-Sep-1921, MRN [id number]. Admission date: 02/27/2014 Discharge Date 03/01/2014 Primary MD GREEN, Lenon Curt, MD Admitting Physician Huey Bienenstock, MD  Admission Diagnosis  Right thigh pain [M79.651] Chest pain [R07.9]   Recommendations for Outpatient Follow-up/had the accepting facility:   -Please recheck labs including CBC and 3 days as patient has been started on XARELTO for DVT and PE.   -Please check BMP, to follow on creatinine and resolution of acute failure.  -Final on blood pressure and adjust medication as needed, -Dental f/u for lucency seen on CT -Wean narcotics, as it is most likely the primary reason for unit retention and constipation. -Monitor for resolution of constipation -Voiding trial /Remove foley cath in 1-2 days-   Discharge Diagnosis   Principal Problem:   Acute renal failure Active Problems:   Chest pain   COPD (chronic obstructive pulmonary disease)   Compression fracture of L2   Leukocytosis   Right hip pain   Acute pulmonary embolism   Acute DVT (deep venous thrombosis)     Past Medical History  Diagnosis Date  . Hypertension 10/23/2011  . Shortness of breath 04/30/2012  . Other nonspecific abnormal finding of lung field   . Contact with or exposure to unspecified communicable disease   . Cystocele, midline 04/10/2012  . Cough 04/10/2012  . Full incontinence of feces 04/10/2012  . Urgency of urination 04/10/2012  . Abdominal pain, other specified site   . Orthopnea 12/01/2011  . Type II or unspecified type diabetes mellitus without mention of complication, not stated as uncontrolled 11/16/2009  . Other and unspecified hyperlipidemia 10/20/2011  . Urinary tract infection, site not specified 09/11/2011  . Muscle weakness (generalized) 09/11/2011  . Syncope and collapse 03/29/2011  . Pain in joint, ankle and foot 12/27/2010  . Sprain of ribs 08/15/2010  . Unspecified tinnitus 07/04/2010  . Pneumonia, organism  unspecified 05/04/2010  . Diverticulosis of colon (without mention of hemorrhage)   . Other and unspecified ovarian cyst 05/04/2010  . Abdominal pain, right lower quadrant 05/02/2010  . Anxiety state, unspecified 02/14/2010  . Shortness of breath 01/05/2010  . Nausea alone 12/13/2009  . Dehydration   . Pain in joint, shoulder region 11/15/2009  . Other abnormal blood chemistry   . Pain in joint, pelvic region and thigh 07/29/2009  . Personal history of fall 07/29/2009  . Other malaise and fatigue 02/15/2009  . Obesity, unspecified 07/16/2008  . Unspecified hereditary and idiopathic peripheral neuropathy   . Reflux esophagitis 10/18/2007  . Unspecified vitamin D deficiency 07/19/2007  . Chest pain, unspecified 07/19/2007  . Varicose veins of lower extremities with inflammation 04/25/2006  . Restless legs syndrome (RLS) 01/16/2006  . Nocturia 07/07/2005  . Encounter for long-term (current) use of other medications   . Cervicalgia 01/20/2004  . Osteoporosis, unspecified 01/23/2003  . Sciatica 04/03/2001  . Routine general medical examination at a health care facility 11/15/2009  . Pain in joint, lower leg 12/11/2001  . Lumbago 02/20/2001  . Hard of hearing     both ears  . Renal disorder     last saw dr deterding 2 months ago, all new meds must be oked thru dr deterding  . Chronic kidney disease, stage III (moderate) 12/15/09  . Acute kidney failure, unspecified 12/10/2009  . Hydronephrosis 07/16/2008  . Cold   . Hypertension   . Diabetes     MANAGED WITH DIET AND EXERCISE  . Anxiety   . High cholesterol   . Depression  Past Surgical History  Procedure Laterality Date  . Appendectomy    . Stapedectomy Bilateral [phone]    middle ear surgery  . Eye surgery Left 1988    cataract w IOL implant, Dr. Vic Ripper  . Fracture surgery Right 09/2004    distal radius/Dr. Teressa Senter  . Tonsillectomy and adenoidectomy  age 38  . Esophagogastroduodenoscopy (egd) with propofol N/A  09/03/2013    Procedure: ESOPHAGOGASTRODUODENOSCOPY (EGD) WITH PROPOFOL;  Surgeon: Willis Modena, MD;  Location: [facility];  Service: Endoscopy;  Laterality: N/A;  . Botox injection N/A 09/03/2013    Procedure: BOTOX INJECTION;  Surgeon: Willis Modena, MD;  Location: [facility];  Service: Endoscopy;  Laterality: N/A;    Admission history of present illness/brief narrative: Christine Pierce is a 80 y.o. female, 80 year old female with history of hypertension, depression, obesity, hyperlipidemia, chronic back pain who was recently discharged on 02/21/14 from [facility] secondary to fall and L2 compression fracture, where she had kyphoplasty on 12/17, discharged to SNF, patient presents today secondary to complaints of chest pain, right hip pain, had negative troponins, had workup which was significant for acute renal failure, patient appears to be clinically dehydrated on admission, creatinine increased from 0.9-1.99 on admission, as well given her recent history, poor ambulatory status, d-dimer was obtained which was significantly elevated, venous Dopplers and VQ scan was done, did show evidence of PE and acute DVT, so patient was started on IV heparin which is being transitioned to oral Xarelto.  Once, had acute onset failure, resolved, patient had an episode of urinary retention which required Foley catheter insertion, with recommendation for a voiding trial as an outpatient in one to 2 days.  Hospital Course See H&P, Labs, Consult and Test reports for all details in brief, patient was admitted for **  Principal Problem:   Acute renal failure Active Problems:   Chest pain   COPD (chronic obstructive pulmonary disease)   Compression fracture of L2   Leukocytosis   Right hip pain   Acute pulmonary embolism   Acute DVT (deep venous thrombosis)  Acute DVT and PE: -Patient initially on heparin drip, will transition her to Xarelto, to finish 15 mg oral twice a day till January 16, then  transition to 20 mg oral daily from January 17, this is considered provoked DVT/PE, recommendation is for six-month treatment. -Check CBC in 3 days  Acute renal failure -Most likely dehydration and volume depletion. -We'll hold Micardis on discharge -Resolved, creatinine is 1.03 on discharge. -Check BMP in 3 days  Leukocytosis -Unclear if it is infectious process versus a steroids. -Chest x-ray does not show any infectious process, she is a febrile, will check urinalysis. -Improving  Hypertension -Resume her Norvasc on discharge. -Pressure stable during hospitalization off Micardis.  Compression fracture of L2 with recent kyphoplasty - when necessary pain meds -PT/OT and facility -Try to wean pain medication  Urinary retention -Foley catheter inserted. -Voiding trial at the nursing home   Code Status: DO NOT RESUSCITATE  Family Communication: Discussed with Thayer Headings, clarisse at date of discharge   Disposition Plan: to subacute rehabilitation  Significant Tests:  See full reports for all details    Dg Chest 2 View  02/27/2014   CLINICAL DATA:  Mid chest pain onset today at noon, shortness of breath, nausea, history hypertension, type 2 diabetes  EXAM: CHEST  2 VIEW  COMPARISON:  02/13/2014  FINDINGS: Enlargement of cardiac silhouette.  Atherosclerotic calcification aorta.  Mediastinal contours and pulmonary vascularity normal.  Mild chronic  peribronchial thickening and emphysematous changes consistent with COPD.  Chronic linear atelectasis versus scarring medial LEFT lower lobe stable.  No definite acute infiltrate, pleural effusion or pneumothorax.  Bones demineralized.  IMPRESSION: COPD changes with chronic linear subsegmental atelectasis versus scarring medial LEFT lower lobe.  Enlargement of cardiac silhouette.  Otherwise negative exam.   Electronically Signed   By: Ulyses Southward M.D.   On: 02/27/2014 15:16   Dg Chest 2 View  02/13/2014   CLINICAL DATA:  80 year old female  status post fall  EXAM: CHEST  2 VIEW  COMPARISON:  Prior chest x-ray 09/17/2013  FINDINGS: Stable cardiac and mediastinal contours. Atherosclerotic calcifications present in the aorta. No evidence of a pneumothorax or pleural effusion. Stable central bronchitic change of a mild severity. And upper lumbar compression fracture is incompletely imaged. This represents a new finding compared to 09/10/2012. However, the appearance is suggestive of a chronic injury.  IMPRESSION: 1. No active cardiopulmonary disease. 2. Incompletely imaged upper lumbar compression fracture is an interval finding compared to the prior chest CT from 09/10/2012. However, the appearance suggests a remote injury. 3. Aortic atherosclerosis.   Electronically Signed   By: Malachy Moan M.D.   On: 02/13/2014 20:29   Dg Lumbar Spine Complete  02/13/2014   CLINICAL DATA:  Fall with low back pain.  EXAM: LUMBAR SPINE - COMPLETE 4+ VIEW  COMPARISON:  05/02/2010 and 09/22/2008 as well as CT 05/02/2010  FINDINGS: Exam demonstrates diffuse decreased bone mineralization. Exam demonstrates mild spondylosis of the lumbar spine to include facet arthropathy. There is a moderate compression fracture of L2 new since 2012 and likely an acute injury. There is prominent deformity of the anterior superior aspect of the vertebral body. No retropulsion component. There is mild grade 1 anterolisthesis of L4 on L5 unchanged. Disc space narrowing at the L5-S1 level unchanged. T11-12 calcified disc unchanged. Subtle lucency along the superior border of the left sacral canal as cannot completely exclude a fracture in this location.  IMPRESSION: Moderate L2 compression fracture likely acute.  Subtle lucency over the superior border of the left sacral L eft as cannot exclude a fracture.  Mild spondylosis of the lumbar spine with disc disease at the L5-S1 level.  Stable grade 1 anterolisthesis of L4 on L5.   Electronically Signed   By: Elberta Fortis M.D.   On:  02/13/2014 20:30   Dg Hip Complete Left  02/15/2014   CLINICAL DATA:  Initial evaluation for left hip pain, fell 2 days ago  EXAM: LEFT HIP - COMPLETE 2+ VIEW  COMPARISON:  None.  FINDINGS: Mild to moderate bilateral hip arthritis. No femur fracture. Pelvic bones intact. Pubic symphysitis. Bilateral sacroiliitis.  IMPRESSION: No acute findings.   Electronically Signed   By: Esperanza Heir M.D.   On: 02/15/2014 19:43   Ct Head Wo Contrast  02/13/2014   CLINICAL DATA:  80 year old who fell while at the nursing home earlier today.  EXAM: CT HEAD WITHOUT CONTRAST  CT CERVICAL SPINE WITHOUT CONTRAST  TECHNIQUE: Multidetector CT imaging of the head and cervical spine was performed following the standard protocol without intravenous contrast. Multiplanar CT image reconstructions of the cervical spine were also generated.  COMPARISON:  CT head and cervical spine 04/11/2012.  FINDINGS: CT HEAD FINDINGS  Moderate to severe age related cortical and deep atrophy, unchanged. Moderate changes of small vessel disease of the cerebral white matter, unchanged. No mass lesion. No midline shift. No acute hemorrhage or hematoma. No extra-axial fluid collections. No  evidence of acute infarction. No significant interval change.  No skull fracture or other focal osseous abnormality involving the skull. Mucosal thickening involving the right maxillary sinus. Remaining visualized paranasal sinuses, bilateral mastoid air cells and bilateral middle ear cavities well aerated. Lucency involving the roots of the 3 most posterior right maxillary teeth; this area was not imaged on the prior examination.  CT CERVICAL SPINE FINDINGS  No fractures identified involving the cervical spine. Sagittal reconstructed images demonstrate anatomic alignment and exaggeration of the usual cervical lordosis. Facet joints intact throughout with severe degenerative changes and fusion of the left facets at C3 and C4. Borderline multifactorial spinal stenosis  at C3-4. Chronic central disc protrusion at C3-4 with calcification in the posterior annular fibers. Vacuum disc phenomenon at C5-6. Coronal reformatted images demonstrate an intact craniocervical junction, intact dens, and intact lateral masses throughout. Severe degenerative changes at the C1-C2 articulation.  IMPRESSION: 1. No acute intracranial abnormality. 2. Stable age related moderate to severe cortical atrophy and mild chronic microvascular ischemic changes of the white matter. 3. No cervical spine fractures identified. 4. Multilevel degenerative disc disease, spondylosis and facet degenerative changes throughout the cervical spine with borderline multifactorial spinal stenosis at C3-4. 5. Dental disease involving the posterior right maxillary teeth with periapical lucency.   Electronically Signed   By: Hulan Saas M.D.   On: 02/13/2014 19:35   Ct Cervical Spine Wo Contrast  02/13/2014   CLINICAL DATA:  80 year old who fell while at the nursing home earlier today.  EXAM: CT HEAD WITHOUT CONTRAST  CT CERVICAL SPINE WITHOUT CONTRAST  TECHNIQUE: Multidetector CT imaging of the head and cervical spine was performed following the standard protocol without intravenous contrast. Multiplanar CT image reconstructions of the cervical spine were also generated.  COMPARISON:  CT head and cervical spine 04/11/2012.  FINDINGS: CT HEAD FINDINGS  Moderate to severe age related cortical and deep atrophy, unchanged. Moderate changes of small vessel disease of the cerebral white matter, unchanged. No mass lesion. No midline shift. No acute hemorrhage or hematoma. No extra-axial fluid collections. No evidence of acute infarction. No significant interval change.  No skull fracture or other focal osseous abnormality involving the skull. Mucosal thickening involving the right maxillary sinus. Remaining visualized paranasal sinuses, bilateral mastoid air cells and bilateral middle ear cavities well aerated. Lucency  involving the roots of the 3 most posterior right maxillary teeth; this area was not imaged on the prior examination.  CT CERVICAL SPINE FINDINGS  No fractures identified involving the cervical spine. Sagittal reconstructed images demonstrate anatomic alignment and exaggeration of the usual cervical lordosis. Facet joints intact throughout with severe degenerative changes and fusion of the left facets at C3 and C4. Borderline multifactorial spinal stenosis at C3-4. Chronic central disc protrusion at C3-4 with calcification in the posterior annular fibers. Vacuum disc phenomenon at C5-6. Coronal reformatted images demonstrate an intact craniocervical junction, intact dens, and intact lateral masses throughout. Severe degenerative changes at the C1-C2 articulation.  IMPRESSION: 1. No acute intracranial abnormality. 2. Stable age related moderate to severe cortical atrophy and mild chronic microvascular ischemic changes of the white matter. 3. No cervical spine fractures identified. 4. Multilevel degenerative disc disease, spondylosis and facet degenerative changes throughout the cervical spine with borderline multifactorial spinal stenosis at C3-4. 5. Dental disease involving the posterior right maxillary teeth with periapical lucency.   Electronically Signed   By: Hulan Saas M.D.   On: 02/13/2014 19:35   Mr Lumbar Spine Wo Contrast  02/18/2014  CLINICAL DATA:  Back pain after a mechanical fall 02/12/2014.  EXAM: MRI LUMBAR SPINE WITHOUT CONTRAST  TECHNIQUE: Multiplanar, multisequence MR imaging of the lumbar spine was performed. No intravenous contrast was administered.  COMPARISON:  Lumbar spine radiographs 02/13/2014.  FINDINGS: There is significant progression of a severe compression fracture is present at L2. Retropulsed bone extends 7.5 mm into the canal. There is severe central canal stenosis at the L2 level as a result. Mild to moderate foraminal narrowing is present bilaterally at L1-2 and L2-3.  Vertebral body heights are otherwise maintained. There is a superior endplate Schmorl's node at L4. Slight anterolisthesis is present at L4-5.  A right renal cyst is present. A 3 cm septated right adnexal cyst is noted. Limited imaging of the abdomen is otherwise unremarkable. There is no significant adenopathy.  L3-4: A broad-based disc protrusion and mild facet hypertrophy bilaterally results in mild left subarticular and bilateral foraminal narrowing.  L4-5: Anterolisthesis results in uncovering of a broad-based disc protrusion. Moderate facet hypertrophy is present. Moderate subarticular stenosis is worse on the right. Mild foraminal narrowing is also worse on the right.  L5-S1: There is chronic loss of disc height without a significant stenosis.  IMPRESSION: 1. 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 2. Mild to moderate foraminal narrowing bilaterally at L1-2 and L2-3. 3. Mild left subarticular and bilateral foraminal narrowing at L3-4. 4. Grade 1 anterolisthesis with moderate serve radicular stenosis bilaterally at L4-5, worse on the right. 5. Mild foraminal narrowing at L4-5 is worse on the right. 6. 3 cm septated right adnexal cyst. This is stable dating back to a CT scan of 2012. These results will be called to the ordering clinician or representative by the Radiologist Assistant, and communication documented in the PACS or zVision Dashboard.   Electronically Signed   By: Gennette Pac M.D.   On: 02/18/2014 20:18   Mr Sacrum/si Joints Wo Contrast  02/19/2014   CLINICAL DATA:  Status post fall 02/12/2014.  Back pain.  EXAM: MR SACRUM WITHOUT CONTRAST  TECHNIQUE: Multiplanar, multisequence MR imaging was performed. No intravenous contrast was administered.  COMPARISON:  None.  FINDINGS: There is no sacral fracture. Imaged pelvis and hips also demonstrate normal signal without fracture or  worrisome lesion. The sacroiliac joints appear normal without effusion, erosion or notable degenerative change. Imaged intrapelvic contents show sigmoid diverticulosis.  IMPRESSION: Negative for fracture.  No acute abnormality.  Sigmoid diverticulosis.   Electronically Signed   By: Drusilla Kanner M.D.   On: 02/19/2014 08:17   Nm Pulmonary Perf And Vent  02/27/2014   CLINICAL DATA:  Shortness of breath with chest pain and elevated D-dimer.  EXAM: NUCLEAR MEDICINE VENTILATION - PERFUSION LUNG SCAN  TECHNIQUE: Ventilation images were obtained in multiple projections using inhaled aerosol technetium 99 M DTPA. Perfusion images were obtained in multiple projections after intravenous injection of Tc-59m MAA.  RADIOPHARMACEUTICALS:  40.0 mCi Tc-43m DTPA aerosol and 6.0 mCi Tc-73m MAA  COMPARISON:  Chest x-ray today.  FINDINGS: Ventilation: Examination demonstrates more than two moderate size peripheral wedge-shaped segmental defects bilaterally.  Perfusion: No wedge shaped peripheral perfusion defects to suggest acute pulmonary embolism.  Chest x-ray demonstrates no focal abnormality.  IMPRESSION: Findings compatible high probability for pulmonary embolism.   Electronically Signed   By: Elberta Fortis M.D.   On: 02/27/2014 19:24   Dg Humerus Right  02/13/2014   CLINICAL  DATA:  Fall. Right upper arm pain. History of osteoporosis.  EXAM: RIGHT HUMERUS - 2+ VIEW  COMPARISON:  10/10/2006  FINDINGS: No fracture observed. The lateral projection raises the possibility of an elbow joint effusion although this is equivocal. If the patient has symptoms in the vicinity of the elbow, consider dedicated elbow 0 radiography.  IMPRESSION: 1. No acute bony findings. Questionable elbow joint effusion. Correlate with any symptoms in the vicinity of the elbow in determining whether dedicated elbow radiography is warranted.   Electronically Signed   By: Herbie BaltimoreWalt  Liebkemann M.D.   On: 02/13/2014 20:28   Ir Vertebroplasty Lumobsacral  Inj  02/24/2014   CLINICAL DATA:  Patient with severely painful compression fracture at L2.  EXAM: IR VERTEBROPLASTY LUMBOSACRAL INJ  MEDICATIONS: Versed 2.5 mg IV, Fentanyl 87.2 mcg IV.  ANESTHESIA/SEDATION: Total Moderate Sedation Time:  40 min.  FLUOROSCOPY TIME:  17 minutes 36 seconds.  PROCEDURE: Following a full explanation of the procedure along with the potentially associated complications, a witnessed informed consent was obtained.  The patient was placed prone on the fluoroscopic table. Nasal oxygen was administered. Physiologic monitoring was performed throughout the duration of the procedure. The skin overlying the lumbar region was prepped and draped in the usual sterile fashion. The L2 vertebral body was identified and both pedicles were infiltrated with 0.25% bupivacaine. This was then followed by the advancement of a 13-gauge Cook needle through each pedicle into the anterior one-third at L2. A gentle contrast injection demonstrated a trabecular pattern of contrast.  At this time, methylmethacrylate mixture was reconstituted. Under biplane intermittent fluoroscopy, the methylmethacrylate was then injected into the L2 vertebral body with filling of the vertebral body.  No extravasation was noted into the disk spaces or posteriorly into the spinal canal. No epidural venous contamination was seen.  The needle was then removed. Hemostasis was achieved at the skin entry site.  There were no acute complications. Patient tolerated the procedure well. The patient was observed for 3 hours and remained in stable condition.  IMPRESSION: Status post vertebral body augmentation for painful compression fracture at L2 using vertebroplasty technique.   Electronically Signed   By: Julieanne CottonSanjeev  Deveshwar M.D.   On: 02/20/2014 13:20     Today   Subjective:   Christine Pierce today has no headache,no chest abdominal pain,no new weakness tingling or numbness, feels much better today .  Objective:   Blood pressure  142/69, pulse 71, temperature 98 F (36.7 C), temperature source Oral, resp. rate 18, weight 81.647 kg (180 lb), SpO2 98 %.  Intake/Output Summary (Last 24 hours) at 03/01/14 1156 Last data filed at 03/01/14 0500  Gross per 24 hour  Intake   1065 ml  Output    300 ml  Net    765 ml    Exam Awake Alert, Oriented X 3, No new F.N deficits, Normal affect Faith.AT,PERRAL Supple Neck,No JVD, No cervical lymphadenopathy appriciated.  Symmetrical Chest wall movement, Good air movement bilaterally, CTAB RRR,No Gallops,Rubs or new Murmurs, No Parasternal Heave +ve B.Sounds, Abd Soft, No tenderness, No organomegaly appriciated, No rebound - guarding or rigidity. No Cyanosis, Clubbing or edema, No new Rash or bruise   Data Review   Cultures -  Results for orders placed or performed during the hospital encounter of 02/13/14  Urine culture     Status: None   Collection Time: 02/13/14  8:40 PM  Result Value Ref Range Status   Specimen Description URINE, CLEAN CATCH  Final   Special  Requests ADDED [id number]  Final   Culture  Setup Time   Final    02/14/2014 06:46 Performed at [facility]    Colony Count   Final    >=100,000 COLONIES/ML Performed at [facility]    Culture   Final    ESCHERICHIA COLI Performed at [facility]    Report Status 02/17/2014 FINAL  Final   Organism ID, Bacteria ESCHERICHIA COLI  Final      Susceptibility   Escherichia coli - MIC*    AMPICILLIN <=2 SENSITIVE Sensitive     CEFAZOLIN <=4 SENSITIVE Sensitive     CEFTRIAXONE <=1 SENSITIVE Sensitive     CIPROFLOXACIN <=0.25 SENSITIVE Sensitive     GENTAMICIN <=1 SENSITIVE Sensitive     LEVOFLOXACIN <=0.12 SENSITIVE Sensitive     NITROFURANTOIN <=16 SENSITIVE Sensitive     TOBRAMYCIN <=1 SENSITIVE Sensitive     TRIMETH/SULFA >=320 RESISTANT Resistant     PIP/TAZO <=4 SENSITIVE Sensitive     * ESCHERICHIA COLI     CBC w Diff:  Lab Results  Component Value Date   WBC 13.2*  03/01/2014   WBC 4.4 03/26/2013   HGB 10.8* 03/01/2014   HCT 33.9* 03/01/2014   PLT 227 03/01/2014   LYMPHOPCT 12 02/27/2014   MONOPCT 12 02/27/2014   EOSPCT 1 02/27/2014   BASOPCT 0 02/27/2014   CMP:  Lab Results  Component Value Date   NA 136 03/01/2014   NA 139 01/19/2014   K 4.7 03/01/2014   CL 105 03/01/2014   CO2 24 03/01/2014   BUN 28* 03/01/2014   BUN 28 01/19/2014   CREATININE 1.03 03/01/2014   PROT 5.4* 02/27/2014   PROT 6.3 01/19/2014   ALBUMIN 3.0* 02/27/2014   BILITOT 0.7 02/27/2014   ALKPHOS 72 02/27/2014   AST 23 02/27/2014   ALT 20 02/27/2014  .  Micro Results No results found for this or any previous visit (from the past 240 hour(s)).   Discharge Instructions          Follow-up Information    Follow up with GREEN, Lenon Curt, MD. Schedule an appointment as soon as possible for a visit in 1 week.   Specialty:  Internal Medicine   Contact information:   [address] [place] [address] [phone]       Discharge Medications     Medication List    STOP taking these medications        ciprofloxacin 250 MG tablet  Commonly known as:  CIPRO     telmisartan-hydrochlorothiazide 80-12.5 MG per tablet  Commonly known as:  MICARDIS HCT      TAKE these medications        acetaminophen 500 MG tablet  Commonly known as:  TYLENOL  Take 500 mg by mouth every 4 (four) hours as needed (pain).     albuterol 108 (90 BASE) MCG/ACT inhaler  Commonly known as:  PROVENTIL HFA;VENTOLIN HFA  Inhale 2 puffs into the lungs every 6 (six) hours as needed for wheezing.     amLODipine 5 MG tablet  Commonly known as:  NORVASC  Take 1 tablet (5 mg total) by mouth daily.     amphetamine-dextroamphetamine 5 MG tablet  Commonly known as:  ADDERALL  Take one tablet by mouth twice daily for ADHD     aspirin 81 MG chewable tablet  Chew 81 mg by mouth daily.     bisacodyl 10 MG suppository  Commonly known as:  DULCOLAX  Place 1 suppository (10 mg  total) rectally daily as needed for moderate constipation.     citalopram 20 MG tablet  Commonly known as:  CELEXA  Take 1 tablet (20 mg total) by mouth daily.     dextromethorphan-guaiFENesin 30-600 MG per 12 hr tablet  Commonly known as:  MUCINEX DM  Take 1 tablet by mouth every 12 (twelve) hours as needed for cough (congestion).     famotidine 20 MG tablet  Commonly known as:  PEPCID  One twice daily to suppress stomach acid     hydrocortisone cream 1 %  Apply to pruritic or irritated areas of legs twice daily as needed     ipratropium-albuterol 0.5-2.5 (3) MG/3ML Soln  Commonly known as:  DUONEB  Take 3 mLs by nebulization 3 (three) times daily.     lovastatin 40 MG tablet  Commonly known as:  MEVACOR  Take 40 mg by mouth every morning.     Melatonin 3 MG Caps  Take 3 mg by mouth at bedtime as needed (Sleep).     multivitamin with minerals Tabs tablet  Take 1 tablet by mouth daily.     nitroGLYCERIN 0.4 MG SL tablet  Commonly known as:  NITROSTAT  Place 0.4 mg under the tongue every 5 (five) minutes as needed for chest pain.     omeprazole 40 MG capsule  Commonly known as:  PRILOSEC  Take 40 mg by mouth 2 (two) times daily.     oxybutynin 5 MG tablet  Commonly known as:  DITROPAN  Take 5 mg by mouth at bedtime.     OxyCODONE 15 mg T12a 12 hr tablet  Commonly known as:  OXYCONTIN  Take 1 tablet (15 mg total) by mouth every 12 (twelve) hours.     oxyCODONE 5 MG immediate release tablet  Commonly known as:  Oxy IR/ROXICODONE  Take 1 tablet (5 mg total) by mouth every 6 (six) hours as needed for severe pain or breakthrough pain.     polyethylene glycol packet  Commonly known as:  MIRALAX  Take 17 g by mouth daily.     polyethylene glycol packet  Commonly known as:  MIRALAX / GLYCOLAX  Take 17 g by mouth daily as needed for mild constipation.     polyethylene glycol packet  Commonly known as:  MIRALAX  Take 17 g by mouth 2 (two) times daily.      predniSONE 10 MG tablet  Commonly known as:  DELTASONE  - Take 10 mg by mouth See admin instructions. Taper starting 02/23/14:  - 30mg  twice daily for 3 days (12/21-12/24)  - 20mg  twice daily for 2 days (12/24-12/26)  - 10mg  daily for 2 days     Rivaroxaban 15 MG Tabs tablet  Commonly known as:  XARELTO  Take 1 tablet (15 mg total) by mouth 2 (two) times daily with a meal.     rivaroxaban 20 MG Tabs tablet  Commonly known as:  XARELTO  Take 1 tablet (20 mg total) by mouth daily with supper.  Start taking on:  03/22/2014     [employer] with seat and brakes.     senna-docusate 8.6-50 MG per tablet  Commonly known as:  Senokot-S  Take 2 tablets by mouth at bedtime.         Total Time in preparing paper work, data evaluation and todays exam - 35 minutes  Aqsa Sensabaugh M.D on 03/01/2014 at 11:56 AM  [facility]  [phone]

## 2014-03-01 NOTE — Progress Notes (Signed)
PT Cancellation Note  Patient Details Name: Christine Pierce MRN: [id number] DOB: 05/16/1921   Cancelled Treatment:    Reason Eval/Treat Not Completed: Patient declined, no reason specified. Patient requested to come back later.  Per nursing, patient is transferring to alternate facility this afternoon.  Will re-attempt evaluation another time, pending overall patient status.   Freida BusmanAllen, Kolter Reaver L 03/01/2014, 4:13 PM

## 2014-03-01 NOTE — Progress Notes (Signed)
Patient Demographics  Christine Pierce, is a 80 y.o. female, DOB - [birth year], ZOX:[id number]  Admit date - 02/27/2014   Admitting Physician Huey Bienenstockawood Sharlette Jansma, MD  Outpatient Primary MD for the patient is GREEN, Lenon CurtARTHUR G, MD  LOS - 2   Chief Complaint  Patient presents with  . Chest Pain      Admission history of present illness/brief narrative: Christine Pierce is a 80 y.o. female, 80 year old female with history of hypertension, depression, obesity, hyperlipidemia, chronic back pain who was [facility] secondary to fall and L2 compression fracture, where she had kyphoplasty on 12/17, discharged to SNF, patient presents today secondary to complaints of chest pain, right hip pain, had negative troponins, had workup which was significant for acute renal failure, patient appears to be clinically dehydrated on admission, creatinine increased from 0.9-1.99 on admission, as well given her recent history, poor ambulatory status, d-dimer was obtained which was significantly elevated, venous Dopplers and VQ scan was done, did show evidence of PE and acute DVT, so patient was started on IV heparin which is being transitioned to oral Xarelto..   Subjective:   [facility] has, No headache, No chest pain, No abdominal pain - No Nausea, No new weakness tingling or numbness, No Cough - SOB.  Assessment & Plan    Principal Problem:   Acute renal failure Active Problems:   Chest pain   COPD (chronic obstructive pulmonary disease)   Compression fracture of L2   Leukocytosis   Right hip pain   Acute pulmonary embolism   Acute DVT (deep venous thrombosis)  Acute DVT and PE: -Patient is on heparin drip, will transition her to Xarelto -Monitor on telemetry.  Chest pain: -This is related to pulmonary embolism, stable.  Cardiac  enzymes, EKG nonacute.  Acute renal failure -Most likely dehydration and volume depleted. -We'll hold Micardis -Continue with IV fluids, monitor BMP closely -Continues to improve  Leukocytosis -Unclear if it is infectious process versus a steroids. -Chest x-ray does not show any infectious process, she is a febrile, will check urinalysis. -Improving  Hypertension -Resume home meds when stable  Compression fracture of L2 with recent kyphoplasty - when necessary pain meds -PT/OT consult  Code Status: DO NOT RESUSCITATE  Family Communication: None at bedside  Disposition Plan: Back to subacute rehabilitation in 24 hours   Procedures ** VQ scan is active for PE Venous Doppler positive for right popliteal vein DVT   Consults   None   Medications  Scheduled Meds: . amphetamine-dextroamphetamine  5 mg Oral BID  . aspirin  81 mg Oral Daily  . citalopram  20 mg Oral Daily  . multivitamin with minerals  1 tablet Oral Daily  . oxybutynin  5 mg Oral QHS  . pantoprazole  80 mg Oral Daily  . polyethylene glycol  17 g Oral Daily  . potassium chloride  40 mEq Oral Once  . pravastatin  40 mg Oral q1800  . predniSONE  10 mg Oral Q breakfast  . Rivaroxaban  15 mg Oral BID WC  . [START ON 03/22/2014] rivaroxaban  20 mg Oral Q supper  . senna-docusate  2 tablet Oral QHS  . tiotropium  18 mcg Inhalation Daily  Continuous Infusions: . sodium chloride 75 mL/hr at 02/28/14 1027   PRN Meds:.acetaminophen **OR** acetaminophen, bisacodyl, dextromethorphan-guaiFENesin, ipratropium-albuterol, nitroGLYCERIN, ondansetron **OR** ondansetron (ZOFRAN) IV, oxyCODONE, polyethylene glycol, white petrolatum  DVT Prophylaxis on heparin drip  Lab Results  Component Value Date   PLT 227 03/01/2014    Antibiotics    Anti-infectives    None          Objective:   Filed Vitals:   02/28/14 0914 02/28/14 1355 02/28/14 2100 03/01/14 0500  BP:  132/55 136/60 142/69  Pulse:  82 76 71    Temp:  98.9 F (37.2 C) 98.8 F (37.1 C) 98 F (36.7 C)  TempSrc:  Oral    Resp:  18 18 18   Weight:    81.647 kg (180 lb)  SpO2: 99% 96% 98% 98%    Wt Readings from Last 3 Encounters:  03/01/14 81.647 kg (180 lb)  02/13/14 77.021 kg (169 lb 12.8 oz)  01/27/14 77.111 kg (170 lb)     Intake/Output Summary (Last 24 hours) at 03/01/14 1204 Last data filed at 03/01/14 0500  Gross per 24 hour  Intake   1065 ml  Output    300 ml  Net    765 ml     Physical Exam  Awake Alert, Oriented X 3, No new F.N deficits, Normal affect [place].AT,PERRAL Supple Neck,No JVD, No cervical lymphadenopathy appriciated.  Symmetrical Chest wall movement, Good air movement bilaterally, CTAB RRR,No Gallops,Rubs or new Murmurs, No Parasternal Heave +ve B.Sounds, Abd Soft, No tenderness, No organomegaly appriciated, No rebound - guarding or rigidity. No Cyanosis, Clubbing or edema, No new Rash or bruise     Data Review   Micro Results No results found for this or any previous visit (from the past 240 hour(s)).  Radiology Reports Dg Chest 2 View  02/27/2014   CLINICAL DATA:  Mid chest pain onset today at noon, shortness of breath, nausea, history hypertension, type 2 diabetes  EXAM: CHEST  2 VIEW  COMPARISON:  02/13/2014  FINDINGS: Enlargement of cardiac silhouette.  Atherosclerotic calcification aorta.  Mediastinal contours and pulmonary vascularity normal.  Mild chronic peribronchial thickening and emphysematous changes consistent with COPD.  Chronic linear atelectasis versus scarring medial LEFT lower lobe stable.  No definite acute infiltrate, pleural effusion or pneumothorax.  Bones demineralized.  IMPRESSION: COPD changes with chronic linear subsegmental atelectasis versus scarring medial LEFT lower lobe.  Enlargement of cardiac silhouette.  Otherwise negative exam.   Electronically Signed   By: Ulyses SouthwardMark  Boles M.D.   On: 02/27/2014 15:16   Nm Pulmonary Perf And Vent  02/27/2014   CLINICAL DATA:   Shortness of breath with chest pain and elevated D-dimer.  EXAM: NUCLEAR MEDICINE VENTILATION - PERFUSION LUNG SCAN  TECHNIQUE: Ventilation images were obtained in multiple projections using inhaled aerosol technetium 99 M DTPA. Perfusion images were obtained in multiple projections after intravenous injection of Tc-5972m MAA.  RADIOPHARMACEUTICALS:  40.0 mCi Tc-4572m DTPA aerosol and 6.0 mCi Tc-4272m MAA  COMPARISON:  Chest x-ray today.  FINDINGS: Ventilation: Examination demonstrates more than two moderate size peripheral wedge-shaped segmental defects bilaterally.  Perfusion: No wedge shaped peripheral perfusion defects to suggest acute pulmonary embolism.  Chest x-ray demonstrates no focal abnormality.  IMPRESSION: Findings compatible high probability for pulmonary embolism.   Electronically Signed   By: [place] Fortisaniel  Boyle M.D.   On: 02/27/2014 19:24    CBC  Recent Labs Lab 02/27/14 1353 02/28/14 0155 03/01/14 0409  WBC 13.9* 11.1* 13.2*  HGB 11.1* 11.1*  10.8*  HCT 33.9* 34.6* 33.9*  PLT 198 184 227  MCV 91.4 92.8 93.4  MCH 29.9 29.8 29.8  MCHC 32.7 32.1 31.9  RDW 13.9 14.1 14.0  LYMPHSABS 1.6  --   --   MONOABS 1.7*  --   --   EOSABS 0.1  --   --   BASOSABS 0.0  --   --     Chemistries   Recent Labs Lab 02/27/14 1353 02/28/14 0155 03/01/14 0409  NA 134* 135 136  K 3.1* 4.1 4.7  CL 99 102 105  CO2 22 25 24   GLUCOSE 137* 98 103*  BUN 61* 56* 28*  CREATININE 1.99* 1.65* 1.03  CALCIUM 8.5 8.7 8.8  AST 23  --   --   ALT 20  --   --   ALKPHOS 72  --   --   BILITOT 0.7  --   --    ------------------------------------------------------------------------------------------------------------------ estimated creatinine clearance is 38.3 mL/min (by C-G formula based on Cr of 1.03). ------------------------------------------------------------------------------------------------------------------ No results for input(s): HGBA1C in the last 72  hours. ------------------------------------------------------------------------------------------------------------------ No results for input(s): CHOL, HDL, LDLCALC, TRIG, CHOLHDL, LDLDIRECT in the last 72 hours. ------------------------------------------------------------------------------------------------------------------ No results for input(s): TSH, T4TOTAL, T3FREE, THYROIDAB in the last 72 hours.  Invalid input(s): FREET3 ------------------------------------------------------------------------------------------------------------------ No results for input(s): VITAMINB12, FOLATE, FERRITIN, TIBC, IRON, RETICCTPCT in the last 72 hours.  Coagulation profile  Recent Labs Lab 02/27/14 1353 02/28/14 0155  INR 1.31 1.29     Recent Labs  02/27/14 1353  DDIMER >20.00*    Cardiac Enzymes  Recent Labs Lab 02/27/14 1945 02/28/14 0155 02/28/14 0918  TROPONINI 0.06* 0.03 0.03   ------------------------------------------------------------------------------------------------------------------ Invalid input(s): POCBNP     Time Spent in minutes   30 minutes   Drue Camera M.D on 03/01/2014 at 12:04 PM  Between 7am to 7pm - Pager - [phone]  After 7pm go to [email or web address] - password [email or web address]  And look for the night coverage person covering for me after hours  [facility] Office  [phone]   **Disclaimer: This note may have been dictated with voice recognition software. Similar sounding words can inadvertently be transcribed and this note may contain transcription errors which may not have been corrected upon publication of note.**

## 2014-03-01 NOTE — Discharge Instructions (Signed)
Follow with Primary MD GREEN, Lenon CurtARTHUR G, MD in 7 days  Owith facility physician  Get CBC, CMP,  by Primary MD next visit.    Activity: As tolerated with Full fall precautions use walker/cane & assistance as needed   Disposition subacute rehabilitation   Diet: Heart Healthy  , with feeding assistance and aspiration precautions as needed.  For Heart failure patients - Check your Weight same time everyday, if you gain over 2 pounds, or you develop in leg swelling, experience more shortness of breath or chest pain, call your Primary MD immediately. Follow Cardiac Low Salt Diet and 1.8 lit/day fluid restriction.   On your next visit with your primary care physician please Get Medicines reviewed and adjusted.   Please request your Prim.MD to go over all Hospital Tests and Procedure/Radiological results at the follow up, please get all Hospital records sent to your Prim MD by signing hospital release before you go home.   If you experience worsening of your admission symptoms, develop shortness of breath, life threatening emergency, suicidal or homicidal thoughts you must seek medical attention immediately by calling 911 or calling your MD immediately  if symptoms less severe.  You Must read complete instructions/literature along with all the possible adverse reactions/side effects for all the Medicines you take and that have been prescribed to you. Take any new Medicines after you have completely understood and accpet all the possible adverse reactions/side effects.   Do not drive, operating heavy machinery, perform activities at heights, swimming or participation in water activities or provide baby sitting services if your were admitted for syncope or siezures until you have seen by Primary MD or a Neurologist and advised to do so again.  Do not drive when taking Pain medications.    Do not take more than prescribed Pain, Sleep and Anxiety Medications  Special Instructions: If you have  smoked or chewed Tobacco  in the last 2 yrs please stop smoking, stop any regular Alcohol  and or any Recreational drug use.  Wear Seat belts while driving.   Please note  You were cared for by a hospitalist during your hospital stay. If you have any questions about your discharge medications or the care you received while you were in the hospital after you are discharged, you can call the unit and asked to speak with the hospitalist on call if the hospitalist that took care of you is not available. Once you are discharged, your primary care physician will handle any further medical issues. Please note that NO REFILLS for any discharge medications will be authorized once you are discharged, as it is imperative that you return to your primary care physician (or establish a relationship with a primary care physician if you do not have one) for your aftercare needs so that they can reassess your need for medications and monitor your lab values. Information on my medicine - XARELTO (rivaroxaban)  This medication education was reviewed with me or my healthcare representative as part of my discharge preparation.  The pharmacist that spoke with me during my hospital stay was:  Makhia Vosler M, RPH  WHY WAS XARELTO PRESCRIBED FOR YOU? Xarelto was prescribed to treat blood clots that may have been found in the veins of your legs (deep vein thrombosis) or in your lungs (pulmonary embolism) and to reduce the risk of them occurring again.  What do you need to know about Xarelto? The starting dose is one 15 mg tablet taken TWICE daily with food for  the FIRST 21 DAYS then on (enter date)  ***  the dose is changed to one 20 mg tablet taken ONCE A DAY with your evening meal.  DO NOT stop taking Xarelto without talking to the health care provider who prescribed the medication.  Refill your prescription for 20 mg tablets before you run out.  After discharge, you should have regular check-up appointments with  your healthcare provider that is prescribing your Xarelto.  In the future your dose may need to be changed if your kidney function changes by a significant amount.  What do you do if you miss a dose? If you are taking Xarelto TWICE DAILY and you miss a dose, take it as soon as you remember. You may take two 15 mg tablets (total 30 mg) at the same time then resume your regularly scheduled 15 mg twice daily the next day.  If you are taking Xarelto ONCE DAILY and you miss a dose, take it as soon as you remember on the same day then continue your regularly scheduled once daily regimen the next day. Do not take two doses of Xarelto at the same time.   Important Safety Information Xarelto is a blood thinner medicine that can cause bleeding. You should call your healthcare provider right away if you experience any of the following: ? Bleeding from an injury or your nose that does not stop. ? Unusual colored urine (red or dark brown) or unusual colored stools (red or black). ? Unusual bruising for unknown reasons. ? A serious fall or if you hit your head (even if there is no bleeding).  Some medicines may interact with Xarelto and might increase your risk of bleeding while on Xarelto. To help avoid this, consult your healthcare provider or pharmacist prior to using any new prescription or non-prescription medications, including herbals, vitamins, non-steroidal anti-inflammatory drugs (NSAIDs) and supplements.  This website has more information on Xarelto: [email or web address].xarelto.[email or web address].

## 2014-03-04 ENCOUNTER — Telehealth: Payer: Self-pay

## 2014-03-04 DIAGNOSIS — R079 Chest pain, unspecified: Secondary | ICD-10-CM | POA: Diagnosis not present

## 2014-03-04 DIAGNOSIS — I2699 Other pulmonary embolism without acute cor pulmonale: Secondary | ICD-10-CM | POA: Diagnosis not present

## 2014-03-04 DIAGNOSIS — R1310 Dysphagia, unspecified: Secondary | ICD-10-CM | POA: Diagnosis not present

## 2014-03-04 DIAGNOSIS — J449 Chronic obstructive pulmonary disease, unspecified: Secondary | ICD-10-CM | POA: Diagnosis not present

## 2014-03-04 DIAGNOSIS — N179 Acute kidney failure, unspecified: Secondary | ICD-10-CM | POA: Diagnosis not present

## 2014-03-04 DIAGNOSIS — K5900 Constipation, unspecified: Secondary | ICD-10-CM | POA: Diagnosis not present

## 2014-03-04 DIAGNOSIS — D649 Anemia, unspecified: Secondary | ICD-10-CM | POA: Diagnosis not present

## 2014-03-04 NOTE — Telephone Encounter (Signed)
Please make hospital f/u appointment in 1-2 weeks     ----- Message -----    From: Huey Bienenstockawood Elgergawy, MD    Sent: 03/01/2014 12:03 PM     To: Kimber RelicArthur G Green, MD     Spoke with Darden [facility]larisse [facility](POA), [facility] being [facility] from hospital. Patient is being followed by Dr.South and will follow-up here once discharged from Nursing Facility

## 2014-03-08 DIAGNOSIS — W19XXXA Unspecified fall, initial encounter: Secondary | ICD-10-CM | POA: Diagnosis not present

## 2014-03-08 DIAGNOSIS — M25551 Pain in right hip: Secondary | ICD-10-CM | POA: Diagnosis not present

## 2014-03-08 DIAGNOSIS — M79651 Pain in right thigh: Secondary | ICD-10-CM | POA: Diagnosis not present

## 2014-03-13 ENCOUNTER — Telehealth (HOSPITAL_COMMUNITY): Payer: Self-pay | Admitting: Interventional Radiology

## 2014-03-13 NOTE — Telephone Encounter (Signed)
Called Ms. Kallie EdwardGrubby to try to schedule [facility] L2 VP. JM

## 2014-03-16 DIAGNOSIS — A491 Streptococcal infection, unspecified site: Secondary | ICD-10-CM | POA: Diagnosis not present

## 2014-03-16 DIAGNOSIS — S32020D Wedge compression fracture of second lumbar vertebra, subsequent encounter for fracture with routine healing: Secondary | ICD-10-CM | POA: Diagnosis not present

## 2014-03-16 DIAGNOSIS — R4182 Altered mental status, unspecified: Secondary | ICD-10-CM | POA: Diagnosis not present

## 2014-03-16 DIAGNOSIS — M5430 Sciatica, unspecified side: Secondary | ICD-10-CM | POA: Diagnosis not present

## 2014-03-17 DIAGNOSIS — M4806 Spinal stenosis, lumbar region: Secondary | ICD-10-CM | POA: Diagnosis not present

## 2014-03-20 DIAGNOSIS — M25551 Pain in right hip: Secondary | ICD-10-CM | POA: Diagnosis not present

## 2014-03-20 DIAGNOSIS — R079 Chest pain, unspecified: Secondary | ICD-10-CM | POA: Diagnosis not present

## 2014-03-20 DIAGNOSIS — I2609 Other pulmonary embolism with acute cor pulmonale: Secondary | ICD-10-CM | POA: Diagnosis not present

## 2014-03-20 DIAGNOSIS — J449 Chronic obstructive pulmonary disease, unspecified: Secondary | ICD-10-CM | POA: Diagnosis not present

## 2014-03-20 DIAGNOSIS — K5900 Constipation, unspecified: Secondary | ICD-10-CM | POA: Diagnosis not present

## 2014-03-20 DIAGNOSIS — R1310 Dysphagia, unspecified: Secondary | ICD-10-CM | POA: Diagnosis not present

## 2014-03-20 DIAGNOSIS — R339 Retention of urine, unspecified: Secondary | ICD-10-CM | POA: Diagnosis not present

## 2014-04-01 ENCOUNTER — Ambulatory Visit: Payer: Medicare Other | Admitting: Neurology

## 2014-04-06 DIAGNOSIS — R279 Unspecified lack of coordination: Secondary | ICD-10-CM | POA: Diagnosis not present

## 2014-04-06 DIAGNOSIS — E1129 Type 2 diabetes mellitus with other diabetic kidney complication: Secondary | ICD-10-CM | POA: Diagnosis not present

## 2014-04-06 DIAGNOSIS — J449 Chronic obstructive pulmonary disease, unspecified: Secondary | ICD-10-CM | POA: Diagnosis not present

## 2014-04-06 DIAGNOSIS — K5900 Constipation, unspecified: Secondary | ICD-10-CM | POA: Diagnosis not present

## 2014-04-06 DIAGNOSIS — F909 Attention-deficit hyperactivity disorder, unspecified type: Secondary | ICD-10-CM | POA: Diagnosis not present

## 2014-04-06 DIAGNOSIS — R260 Ataxic gait: Secondary | ICD-10-CM | POA: Diagnosis not present

## 2014-04-06 DIAGNOSIS — I517 Cardiomegaly: Secondary | ICD-10-CM | POA: Diagnosis not present

## 2014-04-06 DIAGNOSIS — R079 Chest pain, unspecified: Secondary | ICD-10-CM | POA: Diagnosis not present

## 2014-04-06 DIAGNOSIS — N182 Chronic kidney disease, stage 2 (mild): Secondary | ICD-10-CM | POA: Diagnosis not present

## 2014-04-06 DIAGNOSIS — R4182 Altered mental status, unspecified: Secondary | ICD-10-CM | POA: Diagnosis not present

## 2014-04-06 DIAGNOSIS — M6281 Muscle weakness (generalized): Secondary | ICD-10-CM | POA: Diagnosis not present

## 2014-04-06 DIAGNOSIS — R339 Retention of urine, unspecified: Secondary | ICD-10-CM | POA: Diagnosis not present

## 2014-04-06 DIAGNOSIS — N390 Urinary tract infection, site not specified: Secondary | ICD-10-CM | POA: Diagnosis not present

## 2014-04-06 DIAGNOSIS — D649 Anemia, unspecified: Secondary | ICD-10-CM | POA: Diagnosis not present

## 2014-04-06 DIAGNOSIS — I2699 Other pulmonary embolism without acute cor pulmonale: Secondary | ICD-10-CM | POA: Diagnosis not present

## 2014-04-06 DIAGNOSIS — R278 Other lack of coordination: Secondary | ICD-10-CM | POA: Diagnosis not present

## 2014-04-06 DIAGNOSIS — S32020D Wedge compression fracture of second lumbar vertebra, subsequent encounter for fracture with routine healing: Secondary | ICD-10-CM | POA: Diagnosis not present

## 2014-04-08 DIAGNOSIS — R079 Chest pain, unspecified: Secondary | ICD-10-CM | POA: Diagnosis not present

## 2014-04-08 DIAGNOSIS — R4182 Altered mental status, unspecified: Secondary | ICD-10-CM | POA: Diagnosis not present

## 2014-04-08 DIAGNOSIS — D649 Anemia, unspecified: Secondary | ICD-10-CM | POA: Diagnosis not present

## 2014-04-08 DIAGNOSIS — I2699 Other pulmonary embolism without acute cor pulmonale: Secondary | ICD-10-CM | POA: Diagnosis not present

## 2014-04-08 DIAGNOSIS — F909 Attention-deficit hyperactivity disorder, unspecified type: Secondary | ICD-10-CM | POA: Diagnosis not present

## 2014-04-08 DIAGNOSIS — K5900 Constipation, unspecified: Secondary | ICD-10-CM | POA: Diagnosis not present

## 2014-04-08 DIAGNOSIS — R339 Retention of urine, unspecified: Secondary | ICD-10-CM | POA: Diagnosis not present

## 2014-04-08 DIAGNOSIS — J449 Chronic obstructive pulmonary disease, unspecified: Secondary | ICD-10-CM | POA: Diagnosis not present

## 2014-04-13 ENCOUNTER — Other Ambulatory Visit: Payer: Self-pay | Admitting: *Deleted

## 2014-04-13 MED ORDER — AMPHETAMINE-DEXTROAMPHETAMINE 5 MG PO TABS: ORAL_TABLET | ORAL | Status: DC

## 2014-04-13 NOTE — Telephone Encounter (Signed)
[facility] 

## 2014-04-21 DIAGNOSIS — M5090 Cervical disc disorder, unspecified, unspecified cervical region: Secondary | ICD-10-CM | POA: Diagnosis not present

## 2014-04-21 DIAGNOSIS — M6281 Muscle weakness (generalized): Secondary | ICD-10-CM | POA: Diagnosis not present

## 2014-04-21 DIAGNOSIS — N183 Chronic kidney disease, stage 3 (moderate): Secondary | ICD-10-CM | POA: Diagnosis not present

## 2014-04-21 DIAGNOSIS — S32028D Other fracture of second lumbar vertebra, subsequent encounter for fracture with routine healing: Secondary | ICD-10-CM | POA: Diagnosis not present

## 2014-04-21 DIAGNOSIS — J449 Chronic obstructive pulmonary disease, unspecified: Secondary | ICD-10-CM | POA: Diagnosis not present

## 2014-04-21 DIAGNOSIS — E1122 Type 2 diabetes mellitus with diabetic chronic kidney disease: Secondary | ICD-10-CM | POA: Diagnosis not present

## 2014-04-23 DIAGNOSIS — J449 Chronic obstructive pulmonary disease, unspecified: Secondary | ICD-10-CM | POA: Diagnosis not present

## 2014-04-23 DIAGNOSIS — N183 Chronic kidney disease, stage 3 (moderate): Secondary | ICD-10-CM | POA: Diagnosis not present

## 2014-04-23 DIAGNOSIS — M5090 Cervical disc disorder, unspecified, unspecified cervical region: Secondary | ICD-10-CM | POA: Diagnosis not present

## 2014-04-23 DIAGNOSIS — E1122 Type 2 diabetes mellitus with diabetic chronic kidney disease: Secondary | ICD-10-CM | POA: Diagnosis not present

## 2014-04-23 DIAGNOSIS — M6281 Muscle weakness (generalized): Secondary | ICD-10-CM | POA: Diagnosis not present

## 2014-04-23 DIAGNOSIS — S32028D Other fracture of second lumbar vertebra, subsequent encounter for fracture with routine healing: Secondary | ICD-10-CM | POA: Diagnosis not present

## 2014-04-28 ENCOUNTER — Ambulatory Visit (INDEPENDENT_AMBULATORY_CARE_PROVIDER_SITE_OTHER): Payer: Medicare Other | Admitting: Internal Medicine

## 2014-04-28 ENCOUNTER — Encounter: Payer: Self-pay | Admitting: Internal Medicine

## 2014-04-28 VITALS — BP 126/80 | HR 82 | Temp 97.9°F | Resp 12 | Ht 65.0 in | Wt 152.0 lb

## 2014-04-28 DIAGNOSIS — I679 Cerebrovascular disease, unspecified: Secondary | ICD-10-CM | POA: Diagnosis not present

## 2014-04-28 DIAGNOSIS — S32020S Wedge compression fracture of second lumbar vertebra, sequela: Secondary | ICD-10-CM | POA: Diagnosis not present

## 2014-04-28 DIAGNOSIS — Z86711 Personal history of pulmonary embolism: Secondary | ICD-10-CM

## 2014-04-28 DIAGNOSIS — R918 Other nonspecific abnormal finding of lung field: Secondary | ICD-10-CM

## 2014-04-28 DIAGNOSIS — I2699 Other pulmonary embolism without acute cor pulmonale: Secondary | ICD-10-CM | POA: Diagnosis not present

## 2014-04-28 DIAGNOSIS — I82401 Acute embolism and thrombosis of unspecified deep veins of right lower extremity: Secondary | ICD-10-CM

## 2014-04-28 DIAGNOSIS — E1121 Type 2 diabetes mellitus with diabetic nephropathy: Secondary | ICD-10-CM | POA: Diagnosis not present

## 2014-04-28 DIAGNOSIS — R413 Other amnesia: Secondary | ICD-10-CM | POA: Diagnosis not present

## 2014-04-28 DIAGNOSIS — E1129 Type 2 diabetes mellitus with other diabetic kidney complication: Secondary | ICD-10-CM

## 2014-04-28 DIAGNOSIS — N390 Urinary tract infection, site not specified: Secondary | ICD-10-CM | POA: Diagnosis not present

## 2014-04-28 DIAGNOSIS — G319 Degenerative disease of nervous system, unspecified: Secondary | ICD-10-CM

## 2014-04-28 DIAGNOSIS — F909 Attention-deficit hyperactivity disorder, unspecified type: Secondary | ICD-10-CM

## 2014-04-28 DIAGNOSIS — F329 Major depressive disorder, single episode, unspecified: Secondary | ICD-10-CM

## 2014-04-28 DIAGNOSIS — R1314 Dysphagia, pharyngoesophageal phase: Secondary | ICD-10-CM | POA: Diagnosis not present

## 2014-04-28 DIAGNOSIS — Z86718 Personal history of other venous thrombosis and embolism: Secondary | ICD-10-CM

## 2014-04-28 DIAGNOSIS — R269 Unspecified abnormalities of gait and mobility: Secondary | ICD-10-CM

## 2014-04-28 DIAGNOSIS — E785 Hyperlipidemia, unspecified: Secondary | ICD-10-CM

## 2014-04-28 DIAGNOSIS — N182 Chronic kidney disease, stage 2 (mild): Secondary | ICD-10-CM | POA: Diagnosis not present

## 2014-04-28 DIAGNOSIS — F32A Depression, unspecified: Secondary | ICD-10-CM

## 2014-04-28 HISTORY — DX: Hyperlipidemia, unspecified: E78.5

## 2014-04-28 NOTE — Progress Notes (Signed)
Patient ID: Christine Pierce, female   DOB: [birth year], 81 y.o.   MRN: [id number]    Facility  [facility]    Place of Service:   OFFICE   Allergies  Allergen Reactions  . Macrodantin [Nitrofurantoin Macrocrystal]     Affects kidneys  . Proventil [Albuterol]     Patient unable to coordinate use of inhaler    Chief Complaint  Patient presents with  . Medical Management of Chronic Issues    3 month follow-up, no recent labs. Patient is at [facility]  . Medication Management    Review ALL medications dose and instructions   . Urinary Urgency    Ongoing concern   . Form Completion    Complete Xarelto Prior Auth    HPI:  Patient was last seen by me 01/27/14. Since that visit, she has been hospitalized twice and has moved to a new assisted living facility. [name].  Hospitalization from 02/13/2014 through 02/21/2014 for L2 compression fracture following a fall. She under went kyphoplasty on 02/19/14.  Hospitalization was complicated by urinary tract infection. She also complains of left hip discomfort. There was an acute kidney injury with rising BUN and creatinine which resolved to normal prior to discharge. She went to [facility] skilled nursing facility following discharge.  She was rehospitalized between 02/27/2014 and 03/01/2014. She presented with problems of chest pain and right hip pain. Cardiac workup did not disclose myocardial infarction. However, further workup showed a pulmonary embolus and an acute DVT. She initially was twice on heparin and then was transitioned to oral Xarelto. She had urinary retention which required Foley catheter insertion. Lab showed acute renal failure, which again resolved prior to discharge.  Recommendation was for 6 month of therapy for her DVT and pulmonary embolus with Xarelto.  Patient returned to [facility]. While there her narcotic doses were ultimately tapered off and stopped. The Foley catheter was removed and  she was able to begin voiding on her own.  Today she complains of pain in her back and left flank and in the right leg between the hip and knee. Her gait is unstable and she is using a cane in her room and a walker when she leaves the room. Sometimes she forgets to use these.  The pain in the right leg has felt related to history of spinal stenosis. She has had injections in this area previously which gave her some relief. She is now on Xarelto and cannot have the injection well on this medication.  Medications: Patient's Medications  New Prescriptions   No medications on file  Previous Medications   ACETAMINOPHEN (TYLENOL) 500 MG TABLET    1000 mg twice daily, 1 by mouth every 4 hours as needed for pain   ALPRAZOLAM (XANAX) 0.5 MG TABLET    Take 0.5 mg by mouth 2 (two) times daily as needed for anxiety.   AMLODIPINE (NORVASC) 5 MG TABLET    Take 1 tablet (5 mg total) by mouth daily.   AMPHETAMINE-DEXTROAMPHETAMINE (ADDERALL) 5 MG TABLET    Take one tablet by mouth twice daily for ADHD   ASPIRIN 81 MG CHEWABLE TABLET    Chew 81 mg by mouth daily.   CALCITONIN, SALMON, (MIACALCIN/FORTICAL) 200 UNIT/ACT NASAL SPRAY    Place 1 spray into alternate nostrils daily.   CITALOPRAM (CELEXA) 20 MG TABLET    Take 1 tablet (20 mg total) by mouth daily.   DEXTROMETHORPHAN (DELSYM) 30 MG/5ML LIQUID    Take  15 mg by mouth as needed for cough.   FAMOTIDINE (PEPCID) 20 MG TABLET    One twice daily to suppress stomach acid   LOVASTATIN (MEVACOR) 40 MG TABLET    Take 40 mg by mouth every morning.   MELATONIN 3 MG CAPS    Take 3 mg by mouth at bedtime as needed (Sleep).   MISC. DEVICES (ROLLER WALKER) MISC    Rolling Walker with seat and brakes.   MULTIPLE VITAMIN (MULTIVITAMIN WITH MINERALS) TABS TABLET    Take 1 tablet by mouth daily.   NITROGLYCERIN (NITROSTAT) 0.4 MG SL TABLET    Place 0.4 mg under the tongue every 5 (five) minutes as needed for chest pain.   OMEPRAZOLE (PRILOSEC) 40 MG CAPSULE    Take 40  mg by mouth 2 (two) times daily.   OXYBUTYNIN (DITROPAN) 5 MG TABLET    Take 5 mg by mouth at bedtime.   POLYETHYLENE GLYCOL (MIRALAX / GLYCOLAX) PACKET    Take 17 g by mouth daily as needed for mild constipation.   RIVAROXABAN (XARELTO) 20 MG TABS TABLET    Take 1 tablet (20 mg total) by mouth daily with supper.   SENNA-DOCUSATE (SENOKOT-S) 8.6-50 MG PER TABLET    Take 2 tablets by mouth at bedtime.  Modified Medications   No medications on file  Discontinued Medications   ALBUTEROL (PROVENTIL HFA;VENTOLIN HFA) 108 (90 BASE) MCG/ACT INHALER    Inhale 2 puffs into the lungs every 6 (six) hours as needed for wheezing.   BISACODYL (DULCOLAX) 10 MG SUPPOSITORY    Place 1 suppository (10 mg total) rectally daily as needed for moderate constipation.   DEXTROMETHORPHAN-GUAIFENESIN (MUCINEX DM) 30-600 MG PER 12 HR TABLET    Take 1 tablet by mouth every 12 (twelve) hours as needed for cough (congestion).   HYDROCORTISONE CREAM 1 %    Apply to pruritic or irritated areas of legs twice daily as needed   IPRATROPIUM-ALBUTEROL (DUONEB) 0.5-2.5 (3) MG/3ML SOLN    Take 3 mLs by nebulization 3 (three) times daily.   OXYCODONE (OXY IR/ROXICODONE) 5 MG IMMEDIATE RELEASE TABLET    Take 1 tablet (5 mg total) by mouth every 6 (six) hours as needed for severe pain or breakthrough pain.   OXYCODONE (OXYCONTIN) 15 MG T12A 12 HR TABLET    Take 1 tablet (15 mg total) by mouth every 12 (twelve) hours.   PREDNISONE (DELTASONE) 10 MG TABLET    Take 10 mg by mouth See admin instructions. Taper starting 02/23/14: 47m twice daily for 3 days (12/21-12/24) 248mtwice daily for 2 days (12/24-12/26) 1036maily for 2 days   RIVAROXABAN (XARELTO) 15 MG TABS TABLET    Take 1 tablet (15 mg total) by mouth 2 (two) times daily with a meal.     Review of Systems  Constitutional: Positive for fatigue. Negative for fever, chills, diaphoresis, activity change, appetite change and unexpected weight change.  HENT: Positive for hearing  loss.   Eyes: Negative.   Respiratory: Positive for cough and shortness of breath.        Complains of a hoarse voice for the last few months. There is no pain.  Cardiovascular: Positive for leg swelling. Negative for palpitations.  Gastrointestinal: Positive for constipation and rectal pain.       Occasional fecal incontinence. Sometimes has difficulty swallowing. Solids and liquids seem to be involved. There is no odynophagia.  Endocrine:       Elevated blood sugars. Currently on "dietary control". She  is not following her diet closely.  Genitourinary: Positive for urgency and frequency. Negative for dysuria, decreased urine volume, vaginal bleeding, vaginal discharge, enuresis, genital sores, vaginal pain and pelvic pain.       UTI 02/17/14  Musculoskeletal:       Generalized weakness: Pain in the right hip area. Requires assistance in getting up and down. At risk for falls.  Skin: Negative.   Allergic/Immunologic: Negative.   Neurological: Positive for weakness.       Memory loss.  Hematological: Negative.   Psychiatric/Behavioral: Positive for confusion. The patient is nervous/anxious.        History of hoarding behavior. Impulsive buying of products on television.    Filed Vitals:   04/28/14 1549  BP: 126/80  Pulse: 82  Temp: 97.9 F (36.6 C)  TempSrc: Oral  Resp: 12  Height: '5\' 5"'  (1.651 m)  Weight: 152 lb (68.947 kg)  SpO2: 92%   Body mass index is 25.29 kg/(m^2).  Physical Exam  Constitutional: She is oriented to person, place, and time. No distress.  Overweight  HENT:  Head: Atraumatic.  Partial deafness. Uses hearing aids.  Eyes:  Wears prescription lenses.  Neck: No JVD present. No tracheal deviation present. No thyromegaly present.  Some tenderness with movement laterally to either side.  Cardiovascular: Normal rate and regular rhythm.  Exam reveals no friction rub.   Murmur (3/6 SEM at LSB and aortic ejection) heard. Bilateral varicose veins    Pulmonary/Chest: No respiratory distress. She has no wheezes. She has no rales. She exhibits no tenderness.  Tender left flank area lower rib margins laterally and slightly anteriorly. Voice is slightly hoarse.  Abdominal: Soft. Bowel sounds are normal. She exhibits no distension and no mass. There is no tenderness.  Genitourinary: Guaiac negative stool.  Anal fissure at 6 o'clock  Musculoskeletal: Normal range of motion. She exhibits no edema or tenderness.  Tender the greater trochanter and right groin. She is hobbled by this decrease in her stride length.  Lymphadenopathy:    She has no cervical adenopathy.  Neurological: She is alert and oriented to person, place, and time. No cranial nerve deficit. She exhibits normal muscle tone. Coordination normal.  04/30/12 MMSE 27/30. Passed clock drawing. Having some trouble in sequencing events and in telling a coherent story.  Insensitive to vibration in the feet.  Skin: No rash noted. She is not diaphoretic. No erythema. No pallor.  Psychiatric: She has a normal mood and affect. Her behavior is normal. Judgment and thought content normal.     Labs reviewed: Admission on 02/27/2014, Discharged on 03/01/2014  Component Date Value Ref Range Status  . WBC 02/27/2014 13.9* 4.0 - 10.5 K/uL Final  . RBC 02/27/2014 3.71* 3.87 - 5.11 MIL/uL Final  . Hemoglobin 02/27/2014 11.1* 12.0 - 15.0 g/dL Final  . HCT 02/27/2014 33.9* 36.0 - 46.0 % Final  . MCV 02/27/2014 91.4  78.0 - 100.0 fL Final  . MCH 02/27/2014 29.9  26.0 - 34.0 pg Final  . MCHC 02/27/2014 32.7  30.0 - 36.0 g/dL Final  . RDW 02/27/2014 13.9  11.5 - 15.5 % Final  . Platelets 02/27/2014 198  150 - 400 K/uL Final  . Neutrophils Relative % 02/27/2014 75  43 - 77 % Final  . Neutro Abs 02/27/2014 10.5* 1.7 - 7.7 K/uL Final  . Lymphocytes Relative 02/27/2014 12  12 - 46 % Final  . Lymphs Abs 02/27/2014 1.6  0.7 - 4.0 K/uL Final  . Monocytes Relative 02/27/2014  12  3 - 12 % Final  .  Monocytes Absolute 02/27/2014 1.7* 0.1 - 1.0 K/uL Final  . Eosinophils Relative 02/27/2014 1  0 - 5 % Final  . Eosinophils Absolute 02/27/2014 0.1  0.0 - 0.7 K/uL Final  . Basophils Relative 02/27/2014 0  0 - 1 % Final  . Basophils Absolute 02/27/2014 0.0  0.0 - 0.1 K/uL Final  . Sodium 02/27/2014 134* 135 - 145 mmol/L Final   Please note change in reference range.  . Potassium 02/27/2014 3.1* 3.5 - 5.1 mmol/L Final   Please note change in reference range.  . Chloride 02/27/2014 99  96 - 112 mEq/L Final  . CO2 02/27/2014 22  19 - 32 mmol/L Final  . Glucose, Bld 02/27/2014 137* 70 - 99 mg/dL Final  . BUN 02/27/2014 61* 6 - 23 mg/dL Final  . Creatinine, Ser 02/27/2014 1.99* 0.50 - 1.10 mg/dL Final  . Calcium 02/27/2014 8.5  8.4 - 10.5 mg/dL Final  . Total Protein 02/27/2014 5.4* 6.0 - 8.3 g/dL Final  . Albumin 02/27/2014 3.0* 3.5 - 5.2 g/dL Final  . AST 02/27/2014 23  0 - 37 U/L Final  . ALT 02/27/2014 20  0 - 35 U/L Final  . Alkaline Phosphatase 02/27/2014 72  39 - 117 U/L Final  . Total Bilirubin 02/27/2014 0.7  0.3 - 1.2 mg/dL Final  . GFR calc non Af Amer 02/27/2014 21* >90 mL/min Final  . GFR calc Af Amer 02/27/2014 24* >90 mL/min Final   Comment: (NOTE) The eGFR has been calculated using the CKD EPI equation. This calculation has not been validated in all clinical situations. eGFR's persistently <90 mL/min signify possible Chronic Kidney Disease.   . Anion gap 02/27/2014 13  5 - 15 Final  . Troponin I 02/27/2014 <0.03  <0.031 ng/mL Final   Comment:        NO INDICATION OF MYOCARDIAL INJURY. Please note change in reference range.   . Prothrombin Time 02/27/2014 16.5* 11.6 - 15.2 seconds Final  . INR 02/27/2014 1.31  0.00 - 1.49 Final  . D-Dimer, Quant 02/27/2014 >20.00* 0.00 - 0.48 ug/mL-FEU Final   Comment:        AT THE INHOUSE ESTABLISHED CUTOFF VALUE OF 0.48 ug/mL FEU, THIS ASSAY HAS BEEN DOCUMENTED IN THE LITERATURE TO HAVE A SENSITIVITY AND NEGATIVE PREDICTIVE  VALUE OF AT LEAST 98 TO 99%.  THE TEST RESULT SHOULD BE CORRELATED WITH AN ASSESSMENT OF THE CLINICAL PROBABILITY OF DVT / VTE.   Marland Kitchen Troponin I 02/27/2014 0.06* <0.031 ng/mL Final   Comment:        PERSISTENTLY INCREASED TROPONIN VALUES IN THE RANGE OF 0.04-0.49 ng/mL CAN BE SEEN IN:       -UNSTABLE ANGINA       -CONGESTIVE HEART FAILURE       -MYOCARDITIS       -CHEST TRAUMA       -ARRYHTHMIAS       -LATE PRESENTING MYOCARDIAL INFARCTION       -COPD   CLINICAL FOLLOW-UP RECOMMENDED. Please note change in reference range.   . Troponin I 02/28/2014 0.03  <0.031 ng/mL Final   Comment:        NO INDICATION OF MYOCARDIAL INJURY. Please note change in reference range.   . Heparin Unfractionated 02/28/2014 0.48  0.30 - 0.70 IU/mL Final   Comment:        IF HEPARIN RESULTS ARE BELOW EXPECTED VALUES, AND PATIENT DOSAGE HAS BEEN CONFIRMED, SUGGEST FOLLOW UP  TESTING OF ANTITHROMBIN III LEVELS.   Marland Kitchen Sodium 02/28/2014 135  135 - 145 mmol/L Final   Please note change in reference range.  . Potassium 02/28/2014 4.1  3.5 - 5.1 mmol/L Final   Comment: Please note change in reference range. DELTA CHECK NOTED   . Chloride 02/28/2014 102  96 - 112 mEq/L Final  . CO2 02/28/2014 25  19 - 32 mmol/L Final  . Glucose, Bld 02/28/2014 98  70 - 99 mg/dL Final  . BUN 02/28/2014 56* 6 - 23 mg/dL Final  . Creatinine, Ser 02/28/2014 1.65* 0.50 - 1.10 mg/dL Final  . Calcium 02/28/2014 8.7  8.4 - 10.5 mg/dL Final  . GFR calc non Af Amer 02/28/2014 26* >90 mL/min Final  . GFR calc Af Amer 02/28/2014 30* >90 mL/min Final   Comment: (NOTE) The eGFR has been calculated using the CKD EPI equation. This calculation has not been validated in all clinical situations. eGFR's persistently <90 mL/min signify possible Chronic Kidney Disease.   . Anion gap 02/28/2014 8  5 - 15 Final  . WBC 02/28/2014 11.1* 4.0 - 10.5 K/uL Final  . RBC 02/28/2014 3.73* 3.87 - 5.11 MIL/uL Final  . Hemoglobin 02/28/2014 11.1*  12.0 - 15.0 g/dL Final  . HCT 02/28/2014 34.6* 36.0 - 46.0 % Final  . MCV 02/28/2014 92.8  78.0 - 100.0 fL Final  . MCH 02/28/2014 29.8  26.0 - 34.0 pg Final  . MCHC 02/28/2014 32.1  30.0 - 36.0 g/dL Final  . RDW 02/28/2014 14.1  11.5 - 15.5 % Final  . Platelets 02/28/2014 184  150 - 400 K/uL Final  . Prothrombin Time 02/28/2014 16.3* 11.6 - 15.2 seconds Final  . INR 02/28/2014 1.29  0.00 - 1.49 Final  . Troponin I 02/28/2014 0.03  <0.031 ng/mL Final   Comment:        NO INDICATION OF MYOCARDIAL INJURY. Please note change in reference range.   . Heparin Unfractionated 02/28/2014 0.34  0.30 - 0.70 IU/mL Final   Comment:        IF HEPARIN RESULTS ARE BELOW EXPECTED VALUES, AND PATIENT DOSAGE HAS BEEN CONFIRMED, SUGGEST FOLLOW UP TESTING OF ANTITHROMBIN III LEVELS.   . Color, Urine 02/28/2014 YELLOW  YELLOW Final  . APPearance 02/28/2014 CLEAR  CLEAR Final  . Specific Gravity, Urine 02/28/2014 1.015  1.005 - 1.030 Final  . pH 02/28/2014 5.5  5.0 - 8.0 Final  . Glucose, UA 02/28/2014 NEGATIVE  NEGATIVE mg/dL Final  . Hgb urine dipstick 02/28/2014 NEGATIVE  NEGATIVE Final  . Bilirubin Urine 02/28/2014 NEGATIVE  NEGATIVE Final  . Ketones, ur 02/28/2014 NEGATIVE  NEGATIVE mg/dL Final  . Protein, ur 02/28/2014 NEGATIVE  NEGATIVE mg/dL Final  . Urobilinogen, UA 02/28/2014 0.2  0.0 - 1.0 mg/dL Final  . Nitrite 02/28/2014 NEGATIVE  NEGATIVE Final  . Leukocytes, UA 02/28/2014 NEGATIVE  NEGATIVE Final   MICROSCOPIC NOT DONE ON URINES WITH NEGATIVE PROTEIN, BLOOD, LEUKOCYTES, NITRITE, OR GLUCOSE <1000 mg/dL.  . WBC 03/01/2014 13.2* 4.0 - 10.5 K/uL Final  . RBC 03/01/2014 3.63* 3.87 - 5.11 MIL/uL Final  . Hemoglobin 03/01/2014 10.8* 12.0 - 15.0 g/dL Final  . HCT 03/01/2014 33.9* 36.0 - 46.0 % Final  . MCV 03/01/2014 93.4  78.0 - 100.0 fL Final  . MCH 03/01/2014 29.8  26.0 - 34.0 pg Final  . MCHC 03/01/2014 31.9  30.0 - 36.0 g/dL Final  . RDW 03/01/2014 14.0  11.5 - 15.5 % Final  .  Platelets 03/01/2014 227  150 - 400 K/uL Final  . Sodium 03/01/2014 136  135 - 145 mmol/L Final   Please note change in reference range.  . Potassium 03/01/2014 4.7  3.5 - 5.1 mmol/L Final   Please note change in reference range.  . Chloride 03/01/2014 105  96 - 112 mEq/L Final  . CO2 03/01/2014 24  19 - 32 mmol/L Final  . Glucose, Bld 03/01/2014 103* 70 - 99 mg/dL Final  . BUN 03/01/2014 28* 6 - 23 mg/dL Final   DELTA CHECK NOTED  . Creatinine, Ser 03/01/2014 1.03  0.50 - 1.10 mg/dL Final   DELTA CHECK NOTED  . Calcium 03/01/2014 8.8  8.4 - 10.5 mg/dL Final  . GFR calc non Af Amer 03/01/2014 46* >90 mL/min Final  . GFR calc Af Amer 03/01/2014 54* >90 mL/min Final   Comment: (NOTE) The eGFR has been calculated using the CKD EPI equation. This calculation has not been validated in all clinical situations. eGFR's persistently <90 mL/min signify possible Chronic Kidney Disease.   . Anion gap 03/01/2014 7  5 - 15 Final  . Color, Urine 03/01/2014 YELLOW  YELLOW Final  . APPearance 03/01/2014 CLEAR  CLEAR Final  . Specific Gravity, Urine 03/01/2014 1.015  1.005 - 1.030 Final  . pH 03/01/2014 6.5  5.0 - 8.0 Final  . Glucose, UA 03/01/2014 NEGATIVE  NEGATIVE mg/dL Final  . Hgb urine dipstick 03/01/2014 NEGATIVE  NEGATIVE Final  . Bilirubin Urine 03/01/2014 NEGATIVE  NEGATIVE Final  . Ketones, ur 03/01/2014 NEGATIVE  NEGATIVE mg/dL Final  . Protein, ur 03/01/2014 NEGATIVE  NEGATIVE mg/dL Final  . Urobilinogen, UA 03/01/2014 1.0  0.0 - 1.0 mg/dL Final  . Nitrite 03/01/2014 NEGATIVE  NEGATIVE Final  . Leukocytes, UA 03/01/2014 NEGATIVE  NEGATIVE Final   MICROSCOPIC NOT DONE ON URINES WITH NEGATIVE PROTEIN, BLOOD, LEUKOCYTES, NITRITE, OR GLUCOSE <1000 mg/dL.  Admission on 02/13/2014, Discharged on 02/21/2014  Component Date Value Ref Range Status  . WBC 02/13/2014 10.7* 4.0 - 10.5 K/uL Final  . RBC 02/13/2014 3.81* 3.87 - 5.11 MIL/uL Final  . Hemoglobin 02/13/2014 11.6* 12.0 - 15.0 g/dL  Final  . HCT 02/13/2014 35.0* 36.0 - 46.0 % Final  . MCV 02/13/2014 91.9  78.0 - 100.0 fL Final  . MCH 02/13/2014 30.4  26.0 - 34.0 pg Final  . MCHC 02/13/2014 33.1  30.0 - 36.0 g/dL Final  . RDW 02/13/2014 14.5  11.5 - 15.5 % Final  . Platelets 02/13/2014 174  150 - 400 K/uL Final  . Sodium 02/13/2014 140  137 - 147 mEq/L Final  . Potassium 02/13/2014 4.8  3.7 - 5.3 mEq/L Final  . Chloride 02/13/2014 104  96 - 112 mEq/L Final  . CO2 02/13/2014 23  19 - 32 mEq/L Final  . Glucose, Bld 02/13/2014 94  70 - 99 mg/dL Final  . BUN 02/13/2014 33* 6 - 23 mg/dL Final  . Creatinine, Ser 02/13/2014 1.34* 0.50 - 1.10 mg/dL Final  . Calcium 02/13/2014 10.1  8.4 - 10.5 mg/dL Final  . GFR calc non Af Amer 02/13/2014 34* >90 mL/min Final  . GFR calc Af Amer 02/13/2014 39* >90 mL/min Final   Comment: (NOTE) The eGFR has been calculated using the CKD EPI equation. This calculation has not been validated in all clinical situations. eGFR's persistently <90 mL/min signify possible Chronic Kidney Disease.   . Anion gap 02/13/2014 13  5 - 15 Final  . Troponin i, poc 02/13/2014 0.07  0.00 - 0.08 ng/mL Final  .  Comment 3 02/13/2014          Final   Comment: Due to the release kinetics of cTnI, a negative result within the first hours of the onset of symptoms does not rule out myocardial infarction with certainty. If myocardial infarction is still suspected, repeat the test at appropriate intervals.   . Color, Urine 02/13/2014 YELLOW  YELLOW Final  . APPearance 02/13/2014 CLOUDY* CLEAR Final  . Specific Gravity, Urine 02/13/2014 1.010  1.005 - 1.030 Final  . pH 02/13/2014 6.5  5.0 - 8.0 Final  . Glucose, UA 02/13/2014 NEGATIVE  NEGATIVE mg/dL Final  . Hgb urine dipstick 02/13/2014 NEGATIVE  NEGATIVE Final  . Bilirubin Urine 02/13/2014 NEGATIVE  NEGATIVE Final  . Ketones, ur 02/13/2014 NEGATIVE  NEGATIVE mg/dL Final  . Protein, ur 02/13/2014 NEGATIVE  NEGATIVE mg/dL Final  . Urobilinogen, UA  02/13/2014 0.2  0.0 - 1.0 mg/dL Final  . Nitrite 02/13/2014 NEGATIVE  NEGATIVE Final  . Leukocytes, UA 02/13/2014 MODERATE* NEGATIVE Final  . Squamous Epithelial / LPF 02/13/2014 FEW* RARE Final  . WBC, UA 02/13/2014 11-20  <3 WBC/hpf Final  . RBC / HPF 02/13/2014 0-2  <3 RBC/hpf Final  . Bacteria, UA 02/13/2014 MANY* RARE Final  . Specimen Description 02/13/2014 URINE, CLEAN CATCH   Final  . Special Requests 02/13/2014 ADDED [id number]   Final  . Culture  Setup Time 02/13/2014    Final                   Value:02/14/2014 06:46 Performed at [facility]   . Colony Count 02/13/2014    Final                   Value:>=100,000 COLONIES/ML Performed at [facility]   . Culture 02/13/2014    Final                   Value:ESCHERICHIA COLI Performed at [facility]   . Report Status 02/13/2014 02/17/2014 FINAL   Final  . Organism ID, Bacteria 02/13/2014 ESCHERICHIA COLI   Final  . Sodium 02/19/2014 135* 137 - 147 mEq/L Final  . Potassium 02/19/2014 3.9  3.7 - 5.3 mEq/L Final  . Chloride 02/19/2014 94* 96 - 112 mEq/L Final  . CO2 02/19/2014 29  19 - 32 mEq/L Final  . Glucose, Bld 02/19/2014 93  70 - 99 mg/dL Final  . BUN 02/19/2014 20  6 - 23 mg/dL Final  . Creatinine, Ser 02/19/2014 0.92  0.50 - 1.10 mg/dL Final  . Calcium 02/19/2014 9.8  8.4 - 10.5 mg/dL Final  . GFR calc non Af Amer 02/19/2014 53* >90 mL/min Final  . GFR calc Af Amer 02/19/2014 62* >90 mL/min Final   Comment: (NOTE) The eGFR has been calculated using the CKD EPI equation. This calculation has not been validated in all clinical situations. eGFR's persistently <90 mL/min signify possible Chronic Kidney Disease.   . Anion gap 02/19/2014 12  5 - 15 Final  . WBC 02/19/2014 8.5  4.0 - 10.5 K/uL Final  . RBC 02/19/2014 4.01  3.87 - 5.11 MIL/uL Final  . Hemoglobin 02/19/2014 12.1  12.0 - 15.0 g/dL Final  . HCT 02/19/2014 36.5  36.0 - 46.0 % Final  . MCV 02/19/2014 91.0  78.0 - 100.0 fL Final  .  MCH 02/19/2014 30.2  26.0 - 34.0 pg Final  . MCHC 02/19/2014 33.2  30.0 - 36.0 g/dL Final  . RDW 02/19/2014 14.2  11.5 - 15.5 % Final  . Platelets 02/19/2014 205  150 - 400 K/uL Final  . Prothrombin Time 02/19/2014 13.4  11.6 - 15.2 seconds Final  . INR 02/19/2014 1.01  0.00 - 1.49 Final  . aPTT 02/19/2014 30  24 - 37 seconds Final  . Color, Urine 02/19/2014 YELLOW  YELLOW Final  . APPearance 02/19/2014 CLOUDY* CLEAR Final  . Specific Gravity, Urine 02/19/2014 1.016  1.005 - 1.030 Final  . pH 02/19/2014 6.0  5.0 - 8.0 Final  . Glucose, UA 02/19/2014 NEGATIVE  NEGATIVE mg/dL Final  . Hgb urine dipstick 02/19/2014 NEGATIVE  NEGATIVE Final  . Bilirubin Urine 02/19/2014 NEGATIVE  NEGATIVE Final  . Ketones, ur 02/19/2014 15* NEGATIVE mg/dL Final  . Protein, ur 02/19/2014 NEGATIVE  NEGATIVE mg/dL Final  . Urobilinogen, UA 02/19/2014 0.2  0.0 - 1.0 mg/dL Final  . Nitrite 02/19/2014 NEGATIVE  NEGATIVE Final  . Leukocytes, UA 02/19/2014 SMALL* NEGATIVE Final  . Squamous Epithelial / LPF 02/19/2014 FEW* RARE Final  . WBC, UA 02/19/2014 11-20  <3 WBC/hpf Final  . RBC / HPF 02/19/2014 0-2  <3 RBC/hpf Final  . Bacteria, UA 02/19/2014 MANY* RARE Final  Office Visit on 01/27/2014  Component Date Value Ref Range Status  . Specific Gravity, UA 01/27/2014 1.017  1.005 - 1.030 Final  . pH, UA 01/27/2014 6.5  5.0 - 7.5 Final  . Color, UA 01/27/2014 Yellow  Yellow Final  . Appearance Ur 01/27/2014 Cloudy* Clear Final  . Leukocytes, UA 01/27/2014 2+* Negative Final  . Protein, UA 01/27/2014 Negative  Negative/Trace Final  . Glucose, UA 01/27/2014 Negative  Negative Final  . Ketones, UA 01/27/2014 Negative  Negative Final  . RBC, UA 01/27/2014 Negative  Negative Final  . Bilirubin, UA 01/27/2014 Negative  Negative Final  . Urobilinogen, Ur 01/27/2014 0.2  0.2 - 1.0 mg/dL Final  . Nitrite, UA 01/27/2014 Positive* Negative Final  . Urine Culture, Routine 01/27/2014 Final report*  Final  . Result 1  01/27/2014 Escherichia coli*  Final   Greater than 100,000 colony forming units per mL  . RESULT 2 01/27/2014 Comment*  Final   Comment: Beta hemolytic Streptococcus, group B 10,000-25,000 colony forming units per mL Penicillin and ampicillin are drugs of choice for treatment of beta-hemolytic streptococcal infections. Susceptibility testing of penicillins and other beta-lactam agents approved by the FDA for treatment of beta-hemolytic streptococcal infections need not be performed routinely because nonsusceptible isolates are extremely rare in any beta-hemolytic streptococcus and have not been reported for Streptococcus pyogenes (group A). (CLSI 2011)   . ANTIMICROBIAL SUSCEPTIBILITY 01/27/2014 Comment   Final   Comment:       ** S = Susceptible; I = Intermediate; R = Resistant **                    P = Positive; N = Negative             MICS are expressed in micrograms per mL    Antibiotic                 RSLT#1    RSLT#2    RSLT#3    RSLT#4 Amoxicillin/Clavulanic Acid    S Ampicillin                     S Cefepime                       S Ceftriaxone  S Cefuroxime                     S Cephalothin                    I Ciprofloxacin                  S Ertapenem                      S Gentamicin                     S Imipenem                       S Levofloxacin                   S Nitrofurantoin                 S Piperacillin                   S Tetracycline                   R Tobramycin                     S Trimethoprim/Sulfa             R      Assessment/Plan 1. Acute DVT (deep venous thrombosis), right Resolved Minimal leg swelling. No calf tenderness.  2. Abnormality of gait Using walker and cane. I think it is reasonable to let her walk independently, if she uses the cane or walker. She is a fall risk.  3. Acute pulmonary embolism Should be treated with anticoagulation until June 2016. - CBC With Differential; Future  4. CKD (chronic kidney  disease) stage 2, GFR 60-89 ml/min Follow lab  5. Compression fracture of L2, sequela Pain is improved in the back.  6. Diabetes mellitus with renal manifestations, controlled - Comprehensive metabolic panel; Future  7. Dysphagia, pharyngoesophageal phase Mild symptoms of reflux and food sticking in the chest. Using omeprazole which helps. Drug list also seems to contain Pepcid. This may be able to be stopped.  8. Memory deficits Progressing and worse since her hospitalizations. Disorganized in thought content. Repetitive questions and does not seem to retain information well.  9. Urinary tract infection without hematuria, site unspecified Patient complains chronically of increased frequency. There were no signs of infection the last urine culture. She is taking oxybutynin.  10. Hyperlipidemia - Lipid panel; Future  11. Weight loss Patient lost approximately 30 pounds since her hospital stays in December 2015. She was not eating very much at all for several weeks. She now has an improved appetite and likes the food at her new assisted living facility.  12. Hypertension May be able to stop amlodipine. Return in 6 weeks to consider.  13. Depression Try off citalopram  14. ADHD Try off Adderall

## 2014-04-29 ENCOUNTER — Encounter: Payer: Self-pay | Admitting: Internal Medicine

## 2014-04-29 ENCOUNTER — Telehealth: Payer: Self-pay | Admitting: *Deleted

## 2014-04-29 DIAGNOSIS — Z86718 Personal history of other venous thrombosis and embolism: Secondary | ICD-10-CM | POA: Insufficient documentation

## 2014-04-29 DIAGNOSIS — G319 Degenerative disease of nervous system, unspecified: Secondary | ICD-10-CM

## 2014-04-29 DIAGNOSIS — S32028D Other fracture of second lumbar vertebra, subsequent encounter for fracture with routine healing: Secondary | ICD-10-CM | POA: Diagnosis not present

## 2014-04-29 DIAGNOSIS — M6281 Muscle weakness (generalized): Secondary | ICD-10-CM | POA: Diagnosis not present

## 2014-04-29 DIAGNOSIS — E1122 Type 2 diabetes mellitus with diabetic chronic kidney disease: Secondary | ICD-10-CM | POA: Diagnosis not present

## 2014-04-29 DIAGNOSIS — F32A Depression, unspecified: Secondary | ICD-10-CM | POA: Insufficient documentation

## 2014-04-29 DIAGNOSIS — F329 Major depressive disorder, single episode, unspecified: Secondary | ICD-10-CM | POA: Insufficient documentation

## 2014-04-29 DIAGNOSIS — I679 Cerebrovascular disease, unspecified: Secondary | ICD-10-CM

## 2014-04-29 DIAGNOSIS — N183 Chronic kidney disease, stage 3 (moderate): Secondary | ICD-10-CM | POA: Diagnosis not present

## 2014-04-29 DIAGNOSIS — M5090 Cervical disc disorder, unspecified, unspecified cervical region: Secondary | ICD-10-CM | POA: Diagnosis not present

## 2014-04-29 DIAGNOSIS — J449 Chronic obstructive pulmonary disease, unspecified: Secondary | ICD-10-CM | POA: Diagnosis not present

## 2014-04-29 HISTORY — DX: Personal history of other venous thrombosis and embolism: Z86.718

## 2014-04-29 HISTORY — DX: Degenerative disease of nervous system, unspecified: G31.9

## 2014-04-29 HISTORY — DX: Cerebrovascular disease, unspecified: I67.9

## 2014-04-29 NOTE — Telephone Encounter (Signed)
Patient's sitter called with question concerning withdrawal symptoms of the medication Celexa. She wanted to ask Dr Chilton SiGreen if it was necessary to titrate down instead of completely stopping the medication. I informed her that Dr. Chilton SiGreen stated that there were no withdrawal symptoms to Celexa so there would be no need to titrate this medication.

## 2014-05-01 DIAGNOSIS — S32028D Other fracture of second lumbar vertebra, subsequent encounter for fracture with routine healing: Secondary | ICD-10-CM | POA: Diagnosis not present

## 2014-05-01 DIAGNOSIS — M5090 Cervical disc disorder, unspecified, unspecified cervical region: Secondary | ICD-10-CM | POA: Diagnosis not present

## 2014-05-01 DIAGNOSIS — M6281 Muscle weakness (generalized): Secondary | ICD-10-CM | POA: Diagnosis not present

## 2014-05-01 DIAGNOSIS — J449 Chronic obstructive pulmonary disease, unspecified: Secondary | ICD-10-CM | POA: Diagnosis not present

## 2014-05-01 DIAGNOSIS — E1122 Type 2 diabetes mellitus with diabetic chronic kidney disease: Secondary | ICD-10-CM | POA: Diagnosis not present

## 2014-05-01 DIAGNOSIS — N183 Chronic kidney disease, stage 3 (moderate): Secondary | ICD-10-CM | POA: Diagnosis not present

## 2014-05-04 DIAGNOSIS — J449 Chronic obstructive pulmonary disease, unspecified: Secondary | ICD-10-CM | POA: Diagnosis not present

## 2014-05-04 DIAGNOSIS — M6281 Muscle weakness (generalized): Secondary | ICD-10-CM | POA: Diagnosis not present

## 2014-05-04 DIAGNOSIS — N183 Chronic kidney disease, stage 3 (moderate): Secondary | ICD-10-CM | POA: Diagnosis not present

## 2014-05-04 DIAGNOSIS — M5090 Cervical disc disorder, unspecified, unspecified cervical region: Secondary | ICD-10-CM | POA: Diagnosis not present

## 2014-05-04 DIAGNOSIS — E1122 Type 2 diabetes mellitus with diabetic chronic kidney disease: Secondary | ICD-10-CM | POA: Diagnosis not present

## 2014-05-04 DIAGNOSIS — S32028D Other fracture of second lumbar vertebra, subsequent encounter for fracture with routine healing: Secondary | ICD-10-CM | POA: Diagnosis not present

## 2014-05-05 DIAGNOSIS — M5090 Cervical disc disorder, unspecified, unspecified cervical region: Secondary | ICD-10-CM | POA: Diagnosis not present

## 2014-05-05 DIAGNOSIS — J449 Chronic obstructive pulmonary disease, unspecified: Secondary | ICD-10-CM | POA: Diagnosis not present

## 2014-05-05 DIAGNOSIS — M6281 Muscle weakness (generalized): Secondary | ICD-10-CM | POA: Diagnosis not present

## 2014-05-05 DIAGNOSIS — S32028D Other fracture of second lumbar vertebra, subsequent encounter for fracture with routine healing: Secondary | ICD-10-CM | POA: Diagnosis not present

## 2014-05-05 DIAGNOSIS — N183 Chronic kidney disease, stage 3 (moderate): Secondary | ICD-10-CM | POA: Diagnosis not present

## 2014-05-05 DIAGNOSIS — E1122 Type 2 diabetes mellitus with diabetic chronic kidney disease: Secondary | ICD-10-CM | POA: Diagnosis not present

## 2014-05-06 DIAGNOSIS — M6281 Muscle weakness (generalized): Secondary | ICD-10-CM | POA: Diagnosis not present

## 2014-05-06 DIAGNOSIS — N183 Chronic kidney disease, stage 3 (moderate): Secondary | ICD-10-CM | POA: Diagnosis not present

## 2014-05-06 DIAGNOSIS — S32028D Other fracture of second lumbar vertebra, subsequent encounter for fracture with routine healing: Secondary | ICD-10-CM | POA: Diagnosis not present

## 2014-05-06 DIAGNOSIS — E1122 Type 2 diabetes mellitus with diabetic chronic kidney disease: Secondary | ICD-10-CM | POA: Diagnosis not present

## 2014-05-06 DIAGNOSIS — J449 Chronic obstructive pulmonary disease, unspecified: Secondary | ICD-10-CM | POA: Diagnosis not present

## 2014-05-06 DIAGNOSIS — M5090 Cervical disc disorder, unspecified, unspecified cervical region: Secondary | ICD-10-CM | POA: Diagnosis not present

## 2014-05-07 DIAGNOSIS — M5090 Cervical disc disorder, unspecified, unspecified cervical region: Secondary | ICD-10-CM | POA: Diagnosis not present

## 2014-05-07 DIAGNOSIS — M6281 Muscle weakness (generalized): Secondary | ICD-10-CM | POA: Diagnosis not present

## 2014-05-07 DIAGNOSIS — S32028D Other fracture of second lumbar vertebra, subsequent encounter for fracture with routine healing: Secondary | ICD-10-CM | POA: Diagnosis not present

## 2014-05-07 DIAGNOSIS — N183 Chronic kidney disease, stage 3 (moderate): Secondary | ICD-10-CM | POA: Diagnosis not present

## 2014-05-07 DIAGNOSIS — E1122 Type 2 diabetes mellitus with diabetic chronic kidney disease: Secondary | ICD-10-CM | POA: Diagnosis not present

## 2014-05-07 DIAGNOSIS — J449 Chronic obstructive pulmonary disease, unspecified: Secondary | ICD-10-CM | POA: Diagnosis not present

## 2014-05-08 DIAGNOSIS — S32028D Other fracture of second lumbar vertebra, subsequent encounter for fracture with routine healing: Secondary | ICD-10-CM | POA: Diagnosis not present

## 2014-05-08 DIAGNOSIS — E1122 Type 2 diabetes mellitus with diabetic chronic kidney disease: Secondary | ICD-10-CM | POA: Diagnosis not present

## 2014-05-08 DIAGNOSIS — J449 Chronic obstructive pulmonary disease, unspecified: Secondary | ICD-10-CM | POA: Diagnosis not present

## 2014-05-08 DIAGNOSIS — N183 Chronic kidney disease, stage 3 (moderate): Secondary | ICD-10-CM | POA: Diagnosis not present

## 2014-05-08 DIAGNOSIS — M5090 Cervical disc disorder, unspecified, unspecified cervical region: Secondary | ICD-10-CM | POA: Diagnosis not present

## 2014-05-08 DIAGNOSIS — M6281 Muscle weakness (generalized): Secondary | ICD-10-CM | POA: Diagnosis not present

## 2014-05-11 ENCOUNTER — Encounter: Payer: Self-pay | Admitting: Neurology

## 2014-05-11 ENCOUNTER — Ambulatory Visit (INDEPENDENT_AMBULATORY_CARE_PROVIDER_SITE_OTHER): Payer: Medicare Other | Admitting: Neurology

## 2014-05-11 VITALS — BP 140/86 | HR 91 | Ht 65.0 in | Wt 151.0 lb

## 2014-05-11 DIAGNOSIS — E538 Deficiency of other specified B group vitamins: Secondary | ICD-10-CM | POA: Insufficient documentation

## 2014-05-11 DIAGNOSIS — S32028D Other fracture of second lumbar vertebra, subsequent encounter for fracture with routine healing: Secondary | ICD-10-CM | POA: Diagnosis not present

## 2014-05-11 DIAGNOSIS — R413 Other amnesia: Secondary | ICD-10-CM

## 2014-05-11 DIAGNOSIS — M6281 Muscle weakness (generalized): Secondary | ICD-10-CM | POA: Diagnosis not present

## 2014-05-11 DIAGNOSIS — I10 Essential (primary) hypertension: Secondary | ICD-10-CM | POA: Diagnosis not present

## 2014-05-11 DIAGNOSIS — N183 Chronic kidney disease, stage 3 (moderate): Secondary | ICD-10-CM | POA: Diagnosis not present

## 2014-05-11 DIAGNOSIS — E785 Hyperlipidemia, unspecified: Secondary | ICD-10-CM | POA: Diagnosis not present

## 2014-05-11 DIAGNOSIS — E1122 Type 2 diabetes mellitus with diabetic chronic kidney disease: Secondary | ICD-10-CM | POA: Diagnosis not present

## 2014-05-11 DIAGNOSIS — M5090 Cervical disc disorder, unspecified, unspecified cervical region: Secondary | ICD-10-CM | POA: Diagnosis not present

## 2014-05-11 DIAGNOSIS — J449 Chronic obstructive pulmonary disease, unspecified: Secondary | ICD-10-CM | POA: Diagnosis not present

## 2014-05-11 NOTE — Patient Instructions (Signed)
-   discontinue ASA while she is on Xarelto - resumed ASA once Xarelto stopped - discontinue oxybutynin to see if the cognition improves - continue other medications - may consider aricept if cognition not improving over the time - follow up in 2-3 months and repeat MOCA and MMSE test and may consider psychiatry follow up if needed.

## 2014-05-11 NOTE — Progress Notes (Signed)
STROKE NEUROLOGY FOLLOW UP NOTE  NAME: Christine Pierce DOB: 05/22/1921  REASON FOR VISIT: stroke follow up HISTORY FROM: chart and friend  Today we had the pleasure of seeing Christine Pierce in follow-up at our [facility]rology [facility]. Pt was accompanied by friend.   History Summary Christine Pierce is a 81 y.o. female with PMH of HTN, HLD, COPD, GERD, CKD, and anxiety who was seen as a new patient for cognitive concerns on 12/26/13  Pt was doing well and in her normal health until around 2000 when she lost her mom and son within 2-year period. Pt was depressed at that time and became social withdraw. She was not going out and stayed in her house, lived alone, not eating well, not dressing well, not speak well, very disorganized in house, rodents everywhere, uncontrolled buying through telephone, accumulated a lot of stuff in house such that no one could get into her house. She spent all her money and also borrowed significant amount of debt. Eventually, she had to sale her house to pay debt and she was moved to ALF about 1.5 years ago. Since then, she was eating better, more cooperative, had financial help from her friend, although still a lot of debt left but she is going to the right direction.  While she lives in ALF, friends found her having trouble to learn new things, such as how to use inhalers, remote control, and new hearing aids, how to dial numbers from her new phones, etc. She was taught many times but just not able to do it herself. However, she was able to do all her ADLs, writing checks, and still very talkative. She denies any weakness, numbness, LOC, vision changes or speech changes. She denies she has any memory problem. She follows with her PCP every 2-3 months and she denies any smoking, alcohol or illicit drugs. She was referred by Dr. Chilton Si for cognitive evaluation.  Had conversation with her during visit, she behaved very impulsive, pressured speech, super talkative and with minimal  cognitive impairment. However, her MOCA was 27/30. She was not felt to have cognitive impairment but rather decreased concentration and attention with picture of ADHD type psychiatric disorders. She was recommended to have psych follow up and check dementia labs.  Interval History During the interval time, she was hospitalized first from 02/13/2014 to 02/21/2014 for L2 compression fracture following a fall. She under went kyphoplasty on 02/19/14. Hospitalization was complicated by urinary tract infection. She was then discharged to [facility] skilled nursing facility.  She was re-hospitalized from 02/27/2014 to 03/01/2014 for chest pain and right hip pain. Cardiac workup did not disclose myocardial infarction. However, she was found to have a pulmonary embolus and an acute DVT. She was discharged with Xarelto. She had urinary retention which required Foley catheter insertion. Lab showed acute renal failure, which was resolved prior to discharge. She is still on ASA.   Patient currently still in [facility]. Her Foley catheter has been removed and she was able to void on her own. Interestingly, she is still on oxybutynin which she has been on for several years Qhs to decreased urination at night. As per her friend, for the last several months, her cognition is getting worse. Her short memory loss seems getting worse, for example, she kept asking the friend "how about her niece" even giving answers everytime. She also less active as before, more sedentary life style. She was put on Addrell and her celexa was discontinued. Friend did not see  much improvement. She has not seen psychiatrist or psychologist yet.   REVIEW OF SYSTEMS: Full 14 system review of systems performed and notable only for those listed below and in HPI above, all others are negative:  Constitutional:   Cardiovascular:  Ear/Nose/Throat:  Hearing loss Skin:  Eyes:   Respiratory:  Cough, SOB, choking Gastroitestinal:     Genitourinary: urinary incontinence, urgency Hematology/Lymphatic:   Endocrine:  Musculoskeletal:  Back pain, walking difficulty Allergy/Immunology:   Neurological:  Memory loss, dizziness Psychiatric: confusion, decreased concentration Sleep: insomnia  The following represents the patient's updated allergies and side effects list: Allergies  Allergen Reactions  . Macrodantin [Nitrofurantoin Macrocrystal]     Affects kidneys  . Proventil [Albuterol]     Patient unable to coordinate use of inhaler    The neurologically relevant items on the patient's problem list were reviewed on today's visit.  Neurologic Examination  A problem focused neurological exam (12 or more points of the single system neurologic examination, vital signs counts as 1 point, cranial nerves count for 8 points) was performed.  Blood pressure 140/86, pulse 91, height 5\' 5"  (1.651 m), weight 151 lb (68.493 kg).  General - Well nourished, well developed, in no apparent distress.  Ophthalmologic - Sharp disc margins OU.  Cardiovascular - Regular rate and rhythm with no murmur. Carotid pulses were 2+ without bruits .   Neck - supple, no nuchal rigidity .  Mental Status -  Level of arousal and orientation to time, place, and person were intact. Language including expression, naming, repetition, comprehension, reading, and writing was assessed and found intact. Attention span and concentration were normal. Recent and remote memory were intact. Fund of Knowledge was assessed and was intact.  MOCA  visuospatial - executive 4/5 Naming - 3/3 Memory -  Attention - 2/2, 1/1, 3/3 Language - 2/2, 0/1 Abstraction - 2/2 Delayed recall - 0/5 Orientation - 6/6  Total - 23/30  mini-mental status exam  Orientation to time - 2/5 Orientation to place - 4/5 Registration - 3/3 Attention - 5/5 Delayed recall - 3/3 Naming - 2/2 Repetition - 1/1 Comprehension - 3/3 Reading - 1/1 Writing - 1/1 Visuospatial  - 1/1  Total 26/30  Cranial Nerves II - XII - II - Visual field intact OU. III, IV, VI - Extraocular movements intact. V - Facial sensation intact bilaterally. VII - Facial movement intact bilaterally. VIII - Hearing & vestibular intact bilaterally. X - Palate elevates symmetrically. XI - Chin turning & shoulder shrug intact bilaterally. XII - Tongue protrusion intact.  Motor Strength - The patient's strength was normal in all extremities and pronator drift was absent. Bulk was normal and fasciculations were absent.  Motor Tone - Muscle tone was assessed at the neck and appendages and was normal.  Reflexes - The patient's reflexes were normal in all extremities and she had no pathological reflexes.  Sensory - Light touch, temperature/pinprick were assessed and were normal.   Coordination - The patient had normal movements in the hands and feet with no ataxia or dysmetria. Tremor was absent.  Gait and Station - walk with cane, slow, small stride.  Data reviewed: I personally reviewed the images and agree with the radiology interpretations.  VQ scan 02/27/14 - Findings compatible high probability for pulmonary embolism.  Venous doppler 02/27/14 - No evidence of deep vein thrombosis involving the left lower extremity. - Findings consistent with indeterminate age, probably chronic, deep vein thrombosis involving the right popliteal vein. - No evidence of Baker&'s cyst  on the right or left.  Component     Latest Ref Rng 12/01/2013 01/19/2014 02/27/2014  Hemoglobin A1C     4.8 - 5.6 %  6.2 (H)   Est. average glucose Bld gHb Est-mCnc       131   TSH     0.35 - 4.50 uIU/mL 1.43    D-Dimer, Quant     0.00 - 0.48 ug/mL-FEU   >20.00 (H)    Assessment: As you may recall, she is a 81 y.o. Caucasian female with PMH of HTN, HLD, COPD, GERD, CKD, and anxiety presented first on 12/26/13 as a new consult for cognitive decline. However, on neuro exam and interaction with pt, she  does not have dementia, instead her mind is sharp. Her MOCA 27/30 last visit and now 23/30 this visit. She may have MCI. However, she continues to present with impulsive with answering questions, frequent changing topics, inconsistency on exam, resembling ADHD type of picture. During the interval time, she had L2 compression fracture and recent probable PE and DVT, was put on Xarelto. Her MOCA and MMSE tests today showed inconsistent answers. She is on oxybutynin for a long time, which can cause cognitive decline due to anticholinergic effects. She is on addrell and her celexa discontinued. Friends would like to see the effects of discontinuation of oxybutynin, before calling psychiatrist and further evaluation. Her memory deficit could be multifactorial, such as worsening medical condition, medication with oxybutynin, psychiatric disorder and/or early dementia.    Plan:  - discontinue ASA as she is on Xarelto - d/c oxybutynin and observe. May consider aricept or nemanda if necessary.  - continue other meds - Follow up with your primary care physician for stroke risk factor modification. Recommend maintain blood pressure goal <130/80, diabetes with hemoglobin A1c goal below 6.5% and lipids with LDL cholesterol goal below 70 mg/dL.  - recommend psychiatry or psychology - RTC in 2-3 months.   No orders of the defined types were placed in this encounter.    Meds ordered this encounter  Medications  . Multiple Vitamins-Minerals (SUPER THERA VITE M PO)    Sig: Take 1 tablet by mouth daily.  . phenol (CHLORASEPTIC) 1.4 % LIQD    Sig: Use as directed 1 spray in the mouth or throat as needed for throat irritation / pain.  Marland Kitchen amphetamine-dextroamphetamine (ADDERALL) 5 MG tablet    Sig: Take 5 mg by mouth 2 (two) times daily.    Patient Instructions  - discontinue ASA while she is on Xarelto - resumed ASA once Xarelto stopped - discontinue oxybutynin to see if the cognition improves - continue other  medications - may consider aricept if cognition not improving over the time - follow up in 2-3 months and repeat MOCA and MMSE test and may consider psychiatry follow up if needed.    Marvel Plan, MD PhD [facility] [address] [place], [place] [address] [phone]

## 2014-05-12 DIAGNOSIS — J449 Chronic obstructive pulmonary disease, unspecified: Secondary | ICD-10-CM | POA: Diagnosis not present

## 2014-05-12 DIAGNOSIS — E785 Hyperlipidemia, unspecified: Secondary | ICD-10-CM | POA: Insufficient documentation

## 2014-05-12 DIAGNOSIS — M5090 Cervical disc disorder, unspecified, unspecified cervical region: Secondary | ICD-10-CM | POA: Diagnosis not present

## 2014-05-12 DIAGNOSIS — N183 Chronic kidney disease, stage 3 (moderate): Secondary | ICD-10-CM | POA: Diagnosis not present

## 2014-05-12 DIAGNOSIS — I10 Essential (primary) hypertension: Secondary | ICD-10-CM | POA: Insufficient documentation

## 2014-05-12 DIAGNOSIS — S32028D Other fracture of second lumbar vertebra, subsequent encounter for fracture with routine healing: Secondary | ICD-10-CM | POA: Diagnosis not present

## 2014-05-12 DIAGNOSIS — E1122 Type 2 diabetes mellitus with diabetic chronic kidney disease: Secondary | ICD-10-CM | POA: Diagnosis not present

## 2014-05-12 DIAGNOSIS — M6281 Muscle weakness (generalized): Secondary | ICD-10-CM | POA: Diagnosis not present

## 2014-05-13 ENCOUNTER — Ambulatory Visit (INDEPENDENT_AMBULATORY_CARE_PROVIDER_SITE_OTHER): Payer: Medicare Other | Admitting: Internal Medicine

## 2014-05-13 ENCOUNTER — Encounter: Payer: Self-pay | Admitting: Internal Medicine

## 2014-05-13 VITALS — BP 152/86 | HR 92 | Temp 97.6°F | Ht 65.0 in | Wt 151.6 lb

## 2014-05-13 DIAGNOSIS — I10 Essential (primary) hypertension: Secondary | ICD-10-CM

## 2014-05-13 DIAGNOSIS — R413 Other amnesia: Secondary | ICD-10-CM | POA: Diagnosis not present

## 2014-05-13 DIAGNOSIS — F909 Attention-deficit hyperactivity disorder, unspecified type: Secondary | ICD-10-CM | POA: Diagnosis not present

## 2014-05-13 DIAGNOSIS — R1314 Dysphagia, pharyngoesophageal phase: Secondary | ICD-10-CM

## 2014-05-13 DIAGNOSIS — Z23 Encounter for immunization: Secondary | ICD-10-CM

## 2014-05-13 DIAGNOSIS — R1012 Left upper quadrant pain: Secondary | ICD-10-CM

## 2014-05-13 DIAGNOSIS — M810 Age-related osteoporosis without current pathological fracture: Secondary | ICD-10-CM

## 2014-05-13 DIAGNOSIS — R05 Cough: Secondary | ICD-10-CM

## 2014-05-13 DIAGNOSIS — S32020D Wedge compression fracture of second lumbar vertebra, subsequent encounter for fracture with routine healing: Secondary | ICD-10-CM | POA: Diagnosis not present

## 2014-05-13 DIAGNOSIS — R058 Other specified cough: Secondary | ICD-10-CM

## 2014-05-13 MED ORDER — PNEUMOCOCCAL 13-VAL CONJ VACC IM SUSP
0.5000 mL | Freq: Once | INTRAMUSCULAR | Status: AC
  Administered 2014-05-13: 0.5 mL via INTRAMUSCULAR

## 2014-05-13 NOTE — Progress Notes (Signed)
Patient ID: Christine Pierce, female   DOB: May 28, 1921, 81 y.o.   MRN: [id number]    Facility  [facility]    Place of Service:   OFFICE   Allergies  Allergen Reactions  . Macrodantin [Nitrofurantoin Macrocrystal]     Affects kidneys  . Proventil [Albuterol]     Patient unable to coordinate use of inhaler    Chief Complaint  Patient presents with  . Acute Visit    Complains of Cold Sx and Cough and needs facility [facility] reviewed and signed.    HPI:  Medically complicated 81 year old female who is now living in an assisted living facility, [place] [facility].  Upper airway cough syndrome - continues to have problems with significant coughing. It does not keep her awake at night. There is a high probability that she is having some reflux. She is not running fevers. She does produce a little bit of sputum which is white to light yellow in color.  LUQ abdominal pain: Continues to complain of left upper quadrant abdominal discomfort which seems to go up into the bottom ribs on the left side. Denies nausea. No change in bowel habits.  Essential hypertension: Modest elevation in systolic blood pressure today. Denies headache, chest pain, or palpitation  Osteoporosis - follow-up bone density is due for her osteoporosis. Last bone density exam done in 2014.  Dysphagia, pharyngoesophageal phase -as previously seen Dr. Paulita Fujita. Patient states that food is hanging in the center of her chest now. She has a prominent cough which keeps her awake at night.  Need for vaccination with 13-polyvalent pneumococcal conjugate vaccine   Compression fracture of L2, with routine healing, subsequent encounter: Pain control is improving. She continues to wear her anterior posterior back brace.  Memory deficits: Continues with short-term memory loss  Attention deficit hyperactivity disorder (ADHD), unspecified ADHD type: Some speculation from her friend who comes with her that she is becoming more hyperactive  and scatterbrained. She wonders if she is on too much Adderall.    Medications: Patient's Medications  New Prescriptions   No medications on file  Previous Medications   ACETAMINOPHEN (TYLENOL) 500 MG TABLET    1000 mg twice daily, 1 by mouth every 4 hours as needed for pain   AMLODIPINE (NORVASC) 5 MG TABLET    Take 1 tablet (5 mg total) by mouth daily.   AMPHETAMINE-DEXTROAMPHETAMINE (ADDERALL) 5 MG TABLET    Take 5 mg by mouth 2 (two) times daily.   CALCITONIN, SALMON, (MIACALCIN/FORTICAL) 200 UNIT/ACT NASAL SPRAY    Place 1 spray into alternate nostrils every other day.    DEXTROMETHORPHAN (DELSYM) 30 MG/5ML LIQUID    Take 15 mg by mouth as needed for cough.   FAMOTIDINE (PEPCID) 20 MG TABLET    One twice daily to suppress stomach acid   LOVASTATIN (MEVACOR) 40 MG TABLET    Take 40 mg by mouth every morning.   MELATONIN 3 MG CAPS    Take 3 mg by mouth at bedtime as needed (Sleep).   MISC. DEVICES (ROLLER WALKER) MISC    Rolling Walker with seat and brakes.   MULTIPLE VITAMIN (MULTIVITAMIN WITH MINERALS) TABS TABLET    Take 1 tablet by mouth daily.   MULTIPLE VITAMINS-MINERALS (SUPER THERA VITE M PO)    Take 1 tablet by mouth daily.   NITROGLYCERIN (NITROSTAT) 0.4 MG SL TABLET    Place 0.4 mg under the tongue every 5 (five) minutes as needed for chest pain.   OMEPRAZOLE (PRILOSEC)  40 MG CAPSULE    Take 40 mg by mouth 2 (two) times daily.   PHENOL (CHLORASEPTIC) 1.4 % LIQD    Use as directed 1 spray in the mouth or throat as needed for throat irritation / pain.   POLYETHYLENE GLYCOL (MIRALAX / GLYCOLAX) PACKET    Take 17 g by mouth daily as needed for mild constipation.   RIVAROXABAN (XARELTO) 20 MG TABS TABLET    Take 1 tablet (20 mg total) by mouth daily with supper.   SENNA-DOCUSATE (SENOKOT-S) 8.6-50 MG PER TABLET    Take 2 tablets by mouth at bedtime.  Modified Medications   No medications on file  Discontinued Medications   ASPIRIN 81 MG CHEWABLE TABLET    Chew 81 mg by mouth  daily.   OXYBUTYNIN (DITROPAN) 5 MG TABLET    Take 5 mg by mouth at bedtime.     Review of Systems  Constitutional: Positive for fatigue. Negative for fever, chills, diaphoresis, activity change, appetite change and unexpected weight change.  HENT: Positive for hearing loss.   Eyes: Negative.   Respiratory: Positive for cough and shortness of breath.        Complains of a hoarse voice for the last few months. There is no pain.  Cardiovascular: Positive for leg swelling. Negative for palpitations.  Gastrointestinal: Positive for constipation and rectal pain.       Occasional fecal incontinence. Sometimes has difficulty swallowing. Solids and liquids seem to be involved. There is no odynophagia.  Endocrine:       Elevated blood sugars. Currently on "dietary control". She is not following her diet closely.  Genitourinary: Positive for urgency and frequency. Negative for dysuria, decreased urine volume, vaginal bleeding, vaginal discharge, enuresis, genital sores, vaginal pain and pelvic pain.       UTI 02/17/14  Musculoskeletal:       Generalized weakness: Pain in the right hip area. Requires assistance in getting up and down. At risk for falls.  Skin: Negative.   Allergic/Immunologic: Negative.   Neurological: Positive for weakness.       Memory loss.  Hematological: Negative.   Psychiatric/Behavioral: Positive for confusion. The patient is nervous/anxious.        History of hoarding behavior. Impulsive buying of products on television.    Filed Vitals:   05/13/14 1356  BP: 152/86  Pulse: 92  Temp: 97.6 F (36.4 C)  TempSrc: Oral  Height: 5' 5" (1.651 m)  Weight: 151 lb 9.6 oz (68.765 kg)  SpO2: 97%   Body mass index is 25.23 kg/(m^2).  Physical Exam  Constitutional: She is oriented to person, place, and time. No distress.  Overweight  HENT:  Head: Atraumatic.  Partial deafness. Uses hearing aids.  Eyes:  Wears prescription lenses.  Neck: No JVD present. No tracheal  deviation present. No thyromegaly present.  Some tenderness with movement laterally to either side.  Cardiovascular: Normal rate and regular rhythm.  Exam reveals no friction rub.   Murmur (3/6 SEM at LSB and aortic ejection) heard. Bilateral varicose veins  Pulmonary/Chest: No respiratory distress. She has no wheezes. She has no rales. She exhibits no tenderness.  Tender left flank area lower rib margins laterally and slightly anteriorly. Voice is slightly hoarse.  Abdominal: Soft. Bowel sounds are normal. She exhibits no distension and no mass. There is no tenderness.  Genitourinary: Guaiac negative stool.  Anal fissure at 6 o'clock  Musculoskeletal: Normal range of motion. She exhibits no edema or tenderness.  Tender the greater  trochanter and right groin. She is hobbled by this decrease in her stride length.  Lymphadenopathy:    She has no cervical adenopathy.  Neurological: She is alert and oriented to person, place, and time. No cranial nerve deficit. She exhibits normal muscle tone. Coordination normal.  04/30/12 MMSE 27/30. Passed clock drawing. Having some trouble in sequencing events and in telling a coherent story.  Insensitive to vibration in the feet.  Skin: No rash noted. She is not diaphoretic. No erythema. No pallor.  Psychiatric: She has a normal mood and affect. Her behavior is normal. Judgment and thought content normal.     Labs reviewed: Admission on 02/27/2014, Discharged on 03/01/2014  Component Date Value Ref Range Status  . WBC 02/27/2014 13.9* 4.0 - 10.5 K/uL Final  . RBC 02/27/2014 3.71* 3.87 - 5.11 MIL/uL Final  . Hemoglobin 02/27/2014 11.1* 12.0 - 15.0 g/dL Final  . HCT 02/27/2014 33.9* 36.0 - 46.0 % Final  . MCV 02/27/2014 91.4  78.0 - 100.0 fL Final  . MCH 02/27/2014 29.9  26.0 - 34.0 pg Final  . MCHC 02/27/2014 32.7  30.0 - 36.0 g/dL Final  . RDW 02/27/2014 13.9  11.5 - 15.5 % Final  . Platelets 02/27/2014 198  150 - 400 K/uL Final  . Neutrophils  Relative % 02/27/2014 75  43 - 77 % Final  . Neutro Abs 02/27/2014 10.5* 1.7 - 7.7 K/uL Final  . Lymphocytes Relative 02/27/2014 12  12 - 46 % Final  . Lymphs Abs 02/27/2014 1.6  0.7 - 4.0 K/uL Final  . Monocytes Relative 02/27/2014 12  3 - 12 % Final  . Monocytes Absolute 02/27/2014 1.7* 0.1 - 1.0 K/uL Final  . Eosinophils Relative 02/27/2014 1  0 - 5 % Final  . Eosinophils Absolute 02/27/2014 0.1  0.0 - 0.7 K/uL Final  . Basophils Relative 02/27/2014 0  0 - 1 % Final  . Basophils Absolute 02/27/2014 0.0  0.0 - 0.1 K/uL Final  . Sodium 02/27/2014 134* 135 - 145 mmol/L Final   Please note change in reference range.  . Potassium 02/27/2014 3.1* 3.5 - 5.1 mmol/L Final   Please note change in reference range.  . Chloride 02/27/2014 99  96 - 112 mEq/L Final  . CO2 02/27/2014 22  19 - 32 mmol/L Final  . Glucose, Bld 02/27/2014 137* 70 - 99 mg/dL Final  . BUN 02/27/2014 61* 6 - 23 mg/dL Final  . Creatinine, Ser 02/27/2014 1.99* 0.50 - 1.10 mg/dL Final  . Calcium 02/27/2014 8.5  8.4 - 10.5 mg/dL Final  . Total Protein 02/27/2014 5.4* 6.0 - 8.3 g/dL Final  . Albumin 02/27/2014 3.0* 3.5 - 5.2 g/dL Final  . AST 02/27/2014 23  0 - 37 U/L Final  . ALT 02/27/2014 20  0 - 35 U/L Final  . Alkaline Phosphatase 02/27/2014 72  39 - 117 U/L Final  . Total Bilirubin 02/27/2014 0.7  0.3 - 1.2 mg/dL Final  . GFR calc non Af Amer 02/27/2014 21* >90 mL/min Final  . GFR calc Af Amer 02/27/2014 24* >90 mL/min Final   Comment: (NOTE) The eGFR has been calculated using the CKD EPI equation. This calculation has not been validated in all clinical situations. eGFR's persistently <90 mL/min signify possible Chronic Kidney Disease.   . Anion gap 02/27/2014 13  5 - 15 Final  . Troponin I 02/27/2014 <0.03  <0.031 ng/mL Final   Comment:        NO INDICATION OF MYOCARDIAL INJURY. Please  note change in reference range.   . Prothrombin Time 02/27/2014 16.5* 11.6 - 15.2 seconds Final  . INR 02/27/2014 1.31  0.00  - 1.49 Final  . D-Dimer, Quant 02/27/2014 >20.00* 0.00 - 0.48 ug/mL-FEU Final   Comment:        AT THE INHOUSE ESTABLISHED CUTOFF VALUE OF 0.48 ug/mL FEU, THIS ASSAY HAS BEEN DOCUMENTED IN THE LITERATURE TO HAVE A SENSITIVITY AND NEGATIVE PREDICTIVE VALUE OF AT LEAST 98 TO 99%.  THE TEST RESULT SHOULD BE CORRELATED WITH AN ASSESSMENT OF THE CLINICAL PROBABILITY OF DVT / VTE.   Marland Kitchen Troponin I 02/27/2014 0.06* <0.031 ng/mL Final   Comment:        PERSISTENTLY INCREASED TROPONIN VALUES IN THE RANGE OF 0.04-0.49 ng/mL CAN BE SEEN IN:       -UNSTABLE ANGINA       -CONGESTIVE HEART FAILURE       -MYOCARDITIS       -CHEST TRAUMA       -ARRYHTHMIAS       -LATE PRESENTING MYOCARDIAL INFARCTION       -COPD   CLINICAL FOLLOW-UP RECOMMENDED. Please note change in reference range.   . Troponin I 02/28/2014 0.03  <0.031 ng/mL Final   Comment:        NO INDICATION OF MYOCARDIAL INJURY. Please note change in reference range.   . Heparin Unfractionated 02/28/2014 0.48  0.30 - 0.70 IU/mL Final   Comment:        IF HEPARIN RESULTS ARE BELOW EXPECTED VALUES, AND PATIENT DOSAGE HAS BEEN CONFIRMED, SUGGEST FOLLOW UP TESTING OF ANTITHROMBIN III LEVELS.   Marland Kitchen Sodium 02/28/2014 135  135 - 145 mmol/L Final   Please note change in reference range.  . Potassium 02/28/2014 4.1  3.5 - 5.1 mmol/L Final   Comment: Please note change in reference range. DELTA CHECK NOTED   . Chloride 02/28/2014 102  96 - 112 mEq/L Final  . CO2 02/28/2014 25  19 - 32 mmol/L Final  . Glucose, Bld 02/28/2014 98  70 - 99 mg/dL Final  . BUN 02/28/2014 56* 6 - 23 mg/dL Final  . Creatinine, Ser 02/28/2014 1.65* 0.50 - 1.10 mg/dL Final  . Calcium 02/28/2014 8.7  8.4 - 10.5 mg/dL Final  . GFR calc non Af Amer 02/28/2014 26* >90 mL/min Final  . GFR calc Af Amer 02/28/2014 30* >90 mL/min Final   Comment: (NOTE) The eGFR has been calculated using the CKD EPI equation. This calculation has not been validated in all clinical  situations. eGFR's persistently <90 mL/min signify possible Chronic Kidney Disease.   . Anion gap 02/28/2014 8  5 - 15 Final  . WBC 02/28/2014 11.1* 4.0 - 10.5 K/uL Final  . RBC 02/28/2014 3.73* 3.87 - 5.11 MIL/uL Final  . Hemoglobin 02/28/2014 11.1* 12.0 - 15.0 g/dL Final  . HCT 02/28/2014 34.6* 36.0 - 46.0 % Final  . MCV 02/28/2014 92.8  78.0 - 100.0 fL Final  . MCH 02/28/2014 29.8  26.0 - 34.0 pg Final  . MCHC 02/28/2014 32.1  30.0 - 36.0 g/dL Final  . RDW 02/28/2014 14.1  11.5 - 15.5 % Final  . Platelets 02/28/2014 184  150 - 400 K/uL Final  . Prothrombin Time 02/28/2014 16.3* 11.6 - 15.2 seconds Final  . INR 02/28/2014 1.29  0.00 - 1.49 Final  . Troponin I 02/28/2014 0.03  <0.031 ng/mL Final   Comment:        NO INDICATION OF MYOCARDIAL INJURY. Please note change in  reference range.   . Heparin Unfractionated 02/28/2014 0.34  0.30 - 0.70 IU/mL Final   Comment:        IF HEPARIN RESULTS ARE BELOW EXPECTED VALUES, AND PATIENT DOSAGE HAS BEEN CONFIRMED, SUGGEST FOLLOW UP TESTING OF ANTITHROMBIN III LEVELS.   . Color, Urine 02/28/2014 YELLOW  YELLOW Final  . APPearance 02/28/2014 CLEAR  CLEAR Final  . Specific Gravity, Urine 02/28/2014 1.015  1.005 - 1.030 Final  . pH 02/28/2014 5.5  5.0 - 8.0 Final  . Glucose, UA 02/28/2014 NEGATIVE  NEGATIVE mg/dL Final  . Hgb urine dipstick 02/28/2014 NEGATIVE  NEGATIVE Final  . Bilirubin Urine 02/28/2014 NEGATIVE  NEGATIVE Final  . Ketones, ur 02/28/2014 NEGATIVE  NEGATIVE mg/dL Final  . Protein, ur 02/28/2014 NEGATIVE  NEGATIVE mg/dL Final  . Urobilinogen, UA 02/28/2014 0.2  0.0 - 1.0 mg/dL Final  . Nitrite 02/28/2014 NEGATIVE  NEGATIVE Final  . Leukocytes, UA 02/28/2014 NEGATIVE  NEGATIVE Final   MICROSCOPIC NOT DONE ON URINES WITH NEGATIVE PROTEIN, BLOOD, LEUKOCYTES, NITRITE, OR GLUCOSE <1000 mg/dL.  . WBC 03/01/2014 13.2* 4.0 - 10.5 K/uL Final  . RBC 03/01/2014 3.63* 3.87 - 5.11 MIL/uL Final  . Hemoglobin 03/01/2014 10.8* 12.0 -  15.0 g/dL Final  . HCT 03/01/2014 33.9* 36.0 - 46.0 % Final  . MCV 03/01/2014 93.4  78.0 - 100.0 fL Final  . MCH 03/01/2014 29.8  26.0 - 34.0 pg Final  . MCHC 03/01/2014 31.9  30.0 - 36.0 g/dL Final  . RDW 03/01/2014 14.0  11.5 - 15.5 % Final  . Platelets 03/01/2014 227  150 - 400 K/uL Final  . Sodium 03/01/2014 136  135 - 145 mmol/L Final   Please note change in reference range.  . Potassium 03/01/2014 4.7  3.5 - 5.1 mmol/L Final   Please note change in reference range.  . Chloride 03/01/2014 105  96 - 112 mEq/L Final  . CO2 03/01/2014 24  19 - 32 mmol/L Final  . Glucose, Bld 03/01/2014 103* 70 - 99 mg/dL Final  . BUN 03/01/2014 28* 6 - 23 mg/dL Final   DELTA CHECK NOTED  . Creatinine, Ser 03/01/2014 1.03  0.50 - 1.10 mg/dL Final   DELTA CHECK NOTED  . Calcium 03/01/2014 8.8  8.4 - 10.5 mg/dL Final  . GFR calc non Af Amer 03/01/2014 46* >90 mL/min Final  . GFR calc Af Amer 03/01/2014 54* >90 mL/min Final   Comment: (NOTE) The eGFR has been calculated using the CKD EPI equation. This calculation has not been validated in all clinical situations. eGFR's persistently <90 mL/min signify possible Chronic Kidney Disease.   . Anion gap 03/01/2014 7  5 - 15 Final  . Color, Urine 03/01/2014 YELLOW  YELLOW Final  . APPearance 03/01/2014 CLEAR  CLEAR Final  . Specific Gravity, Urine 03/01/2014 1.015  1.005 - 1.030 Final  . pH 03/01/2014 6.5  5.0 - 8.0 Final  . Glucose, UA 03/01/2014 NEGATIVE  NEGATIVE mg/dL Final  . Hgb urine dipstick 03/01/2014 NEGATIVE  NEGATIVE Final  . Bilirubin Urine 03/01/2014 NEGATIVE  NEGATIVE Final  . Ketones, ur 03/01/2014 NEGATIVE  NEGATIVE mg/dL Final  . Protein, ur 03/01/2014 NEGATIVE  NEGATIVE mg/dL Final  . Urobilinogen, UA 03/01/2014 1.0  0.0 - 1.0 mg/dL Final  . Nitrite 03/01/2014 NEGATIVE  NEGATIVE Final  . Leukocytes, UA 03/01/2014 NEGATIVE  NEGATIVE Final   MICROSCOPIC NOT DONE ON URINES WITH NEGATIVE PROTEIN, BLOOD, LEUKOCYTES, NITRITE, OR GLUCOSE  <1000 mg/dL.  Admission on 02/13/2014, Discharged on  02/21/2014  Component Date Value Ref Range Status  . WBC 02/13/2014 10.7* 4.0 - 10.5 K/uL Final  . RBC 02/13/2014 3.81* 3.87 - 5.11 MIL/uL Final  . Hemoglobin 02/13/2014 11.6* 12.0 - 15.0 g/dL Final  . HCT 02/13/2014 35.0* 36.0 - 46.0 % Final  . MCV 02/13/2014 91.9  78.0 - 100.0 fL Final  . MCH 02/13/2014 30.4  26.0 - 34.0 pg Final  . MCHC 02/13/2014 33.1  30.0 - 36.0 g/dL Final  . RDW 02/13/2014 14.5  11.5 - 15.5 % Final  . Platelets 02/13/2014 174  150 - 400 K/uL Final  . Sodium 02/13/2014 140  137 - 147 mEq/L Final  . Potassium 02/13/2014 4.8  3.7 - 5.3 mEq/L Final  . Chloride 02/13/2014 104  96 - 112 mEq/L Final  . CO2 02/13/2014 23  19 - 32 mEq/L Final  . Glucose, Bld 02/13/2014 94  70 - 99 mg/dL Final  . BUN 02/13/2014 33* 6 - 23 mg/dL Final  . Creatinine, Ser 02/13/2014 1.34* 0.50 - 1.10 mg/dL Final  . Calcium 02/13/2014 10.1  8.4 - 10.5 mg/dL Final  . GFR calc non Af Amer 02/13/2014 34* >90 mL/min Final  . GFR calc Af Amer 02/13/2014 39* >90 mL/min Final   Comment: (NOTE) The eGFR has been calculated using the CKD EPI equation. This calculation has not been validated in all clinical situations. eGFR's persistently <90 mL/min signify possible Chronic Kidney Disease.   . Anion gap 02/13/2014 13  5 - 15 Final  . Troponin i, poc 02/13/2014 0.07  0.00 - 0.08 ng/mL Final  . Comment 3 02/13/2014          Final   Comment: Due to the release kinetics of cTnI, a negative result within the first hours of the onset of symptoms does not rule out myocardial infarction with certainty. If myocardial infarction is still suspected, repeat the test at appropriate intervals.   . Color, Urine 02/13/2014 YELLOW  YELLOW Final  . APPearance 02/13/2014 CLOUDY* CLEAR Final  . Specific Gravity, Urine 02/13/2014 1.010  1.005 - 1.030 Final  . pH 02/13/2014 6.5  5.0 - 8.0 Final  . Glucose, UA 02/13/2014 NEGATIVE  NEGATIVE mg/dL Final  . Hgb  urine dipstick 02/13/2014 NEGATIVE  NEGATIVE Final  . Bilirubin Urine 02/13/2014 NEGATIVE  NEGATIVE Final  . Ketones, ur 02/13/2014 NEGATIVE  NEGATIVE mg/dL Final  . Protein, ur 02/13/2014 NEGATIVE  NEGATIVE mg/dL Final  . Urobilinogen, UA 02/13/2014 0.2  0.0 - 1.0 mg/dL Final  . Nitrite 02/13/2014 NEGATIVE  NEGATIVE Final  . Leukocytes, UA 02/13/2014 MODERATE* NEGATIVE Final  . Squamous Epithelial / LPF 02/13/2014 FEW* RARE Final  . WBC, UA 02/13/2014 11-20  <3 WBC/hpf Final  . RBC / HPF 02/13/2014 0-2  <3 RBC/hpf Final  . Bacteria, UA 02/13/2014 MANY* RARE Final  . Specimen Description 02/13/2014 URINE, CLEAN CATCH   Final  . Special Requests 02/13/2014 ADDED [id number]   Final  . Culture  Setup Time 02/13/2014    Final                   Value:02/14/2014 06:46 Performed at [facility]   . Colony Count 02/13/2014    Final                   Value:>=100,000 COLONIES/ML Performed at [facility]   . Culture 02/13/2014    Final  Value:ESCHERICHIA COLI Performed at [facility]   . Report Status 02/13/2014 02/17/2014 FINAL   Final  . Organism ID, Bacteria 02/13/2014 ESCHERICHIA COLI   Final  . Sodium 02/19/2014 135* 137 - 147 mEq/L Final  . Potassium 02/19/2014 3.9  3.7 - 5.3 mEq/L Final  . Chloride 02/19/2014 94* 96 - 112 mEq/L Final  . CO2 02/19/2014 29  19 - 32 mEq/L Final  . Glucose, Bld 02/19/2014 93  70 - 99 mg/dL Final  . BUN 02/19/2014 20  6 - 23 mg/dL Final  . Creatinine, Ser 02/19/2014 0.92  0.50 - 1.10 mg/dL Final  . Calcium 02/19/2014 9.8  8.4 - 10.5 mg/dL Final  . GFR calc non Af Amer 02/19/2014 53* >90 mL/min Final  . GFR calc Af Amer 02/19/2014 62* >90 mL/min Final   Comment: (NOTE) The eGFR has been calculated using the CKD EPI equation. This calculation has not been validated in all clinical situations. eGFR's persistently <90 mL/min signify possible Chronic Kidney Disease.   . Anion gap 02/19/2014 12  5 - 15 Final  .  WBC 02/19/2014 8.5  4.0 - 10.5 K/uL Final  . RBC 02/19/2014 4.01  3.87 - 5.11 MIL/uL Final  . Hemoglobin 02/19/2014 12.1  12.0 - 15.0 g/dL Final  . HCT 02/19/2014 36.5  36.0 - 46.0 % Final  . MCV 02/19/2014 91.0  78.0 - 100.0 fL Final  . MCH 02/19/2014 30.2  26.0 - 34.0 pg Final  . MCHC 02/19/2014 33.2  30.0 - 36.0 g/dL Final  . RDW 02/19/2014 14.2  11.5 - 15.5 % Final  . Platelets 02/19/2014 205  150 - 400 K/uL Final  . Prothrombin Time 02/19/2014 13.4  11.6 - 15.2 seconds Final  . INR 02/19/2014 1.01  0.00 - 1.49 Final  . aPTT 02/19/2014 30  24 - 37 seconds Final  . Color, Urine 02/19/2014 YELLOW  YELLOW Final  . APPearance 02/19/2014 CLOUDY* CLEAR Final  . Specific Gravity, Urine 02/19/2014 1.016  1.005 - 1.030 Final  . pH 02/19/2014 6.0  5.0 - 8.0 Final  . Glucose, UA 02/19/2014 NEGATIVE  NEGATIVE mg/dL Final  . Hgb urine dipstick 02/19/2014 NEGATIVE  NEGATIVE Final  . Bilirubin Urine 02/19/2014 NEGATIVE  NEGATIVE Final  . Ketones, ur 02/19/2014 15* NEGATIVE mg/dL Final  . Protein, ur 02/19/2014 NEGATIVE  NEGATIVE mg/dL Final  . Urobilinogen, UA 02/19/2014 0.2  0.0 - 1.0 mg/dL Final  . Nitrite 02/19/2014 NEGATIVE  NEGATIVE Final  . Leukocytes, UA 02/19/2014 SMALL* NEGATIVE Final  . Squamous Epithelial / LPF 02/19/2014 FEW* RARE Final  . WBC, UA 02/19/2014 11-20  <3 WBC/hpf Final  . RBC / HPF 02/19/2014 0-2  <3 RBC/hpf Final  . Bacteria, UA 02/19/2014 MANY* RARE Final     Assessment/Plan  1. Upper airway cough syndrome Etiology of her cough is unclear. She has seen Dr. Melvyn Novas. There is some speculation that her cough may be related to persistent reflux issues. - Ambulatory referral to Gastroenterology  2. LUQ abdominal pain Chronic. Seems to be unchanging. She brings it up a revisit to remind me that there is still an issue there.  3. Essential hypertension Mild systolic blood pressure elevation today. No change in medication. She remains on amlodipine 5 mg daily  4.  Osteoporosis - DG Bone Density; Future  5. Dysphagia, pharyngoesophageal phase Remains on omeprazole 40 mg twice daily - Ambulatory referral to Gastroenterology, Dr. Paulita Fujita  6. Need for vaccination with 13-polyvalent pneumococcal conjugate vaccine - pneumococcal 13-valent  conjugate vaccine (PREVNAR 13) injection 0.5 mL; Inject 0.5 mLs into the muscle once.  7. Compression fracture of L2, with routine healing, subsequent encounter Continue wearing brace.  8. Memory deficits Unchanged  9. Attention deficit hyperactivity disorder (ADHD), unspecified ADHD type Reduce Adderall to 5 mg each morning

## 2014-05-14 DIAGNOSIS — N183 Chronic kidney disease, stage 3 (moderate): Secondary | ICD-10-CM | POA: Diagnosis not present

## 2014-05-14 DIAGNOSIS — E1122 Type 2 diabetes mellitus with diabetic chronic kidney disease: Secondary | ICD-10-CM | POA: Diagnosis not present

## 2014-05-14 DIAGNOSIS — J449 Chronic obstructive pulmonary disease, unspecified: Secondary | ICD-10-CM | POA: Diagnosis not present

## 2014-05-14 DIAGNOSIS — S32028D Other fracture of second lumbar vertebra, subsequent encounter for fracture with routine healing: Secondary | ICD-10-CM | POA: Diagnosis not present

## 2014-05-14 DIAGNOSIS — M5090 Cervical disc disorder, unspecified, unspecified cervical region: Secondary | ICD-10-CM | POA: Diagnosis not present

## 2014-05-14 DIAGNOSIS — M6281 Muscle weakness (generalized): Secondary | ICD-10-CM | POA: Diagnosis not present

## 2014-05-14 MED ORDER — AMPHETAMINE-DEXTROAMPHETAMINE 5 MG PO TABS: ORAL_TABLET | ORAL | Status: DC

## 2014-05-15 DIAGNOSIS — S32028D Other fracture of second lumbar vertebra, subsequent encounter for fracture with routine healing: Secondary | ICD-10-CM | POA: Diagnosis not present

## 2014-05-15 DIAGNOSIS — N183 Chronic kidney disease, stage 3 (moderate): Secondary | ICD-10-CM | POA: Diagnosis not present

## 2014-05-15 DIAGNOSIS — M6281 Muscle weakness (generalized): Secondary | ICD-10-CM | POA: Diagnosis not present

## 2014-05-15 DIAGNOSIS — J449 Chronic obstructive pulmonary disease, unspecified: Secondary | ICD-10-CM | POA: Diagnosis not present

## 2014-05-15 DIAGNOSIS — E1122 Type 2 diabetes mellitus with diabetic chronic kidney disease: Secondary | ICD-10-CM | POA: Diagnosis not present

## 2014-05-15 DIAGNOSIS — M5090 Cervical disc disorder, unspecified, unspecified cervical region: Secondary | ICD-10-CM | POA: Diagnosis not present

## 2014-05-18 DIAGNOSIS — M6281 Muscle weakness (generalized): Secondary | ICD-10-CM | POA: Diagnosis not present

## 2014-05-18 DIAGNOSIS — J449 Chronic obstructive pulmonary disease, unspecified: Secondary | ICD-10-CM | POA: Diagnosis not present

## 2014-05-18 DIAGNOSIS — E1122 Type 2 diabetes mellitus with diabetic chronic kidney disease: Secondary | ICD-10-CM | POA: Diagnosis not present

## 2014-05-18 DIAGNOSIS — S32028D Other fracture of second lumbar vertebra, subsequent encounter for fracture with routine healing: Secondary | ICD-10-CM | POA: Diagnosis not present

## 2014-05-18 DIAGNOSIS — N183 Chronic kidney disease, stage 3 (moderate): Secondary | ICD-10-CM | POA: Diagnosis not present

## 2014-05-18 DIAGNOSIS — M5090 Cervical disc disorder, unspecified, unspecified cervical region: Secondary | ICD-10-CM | POA: Diagnosis not present

## 2014-05-19 DIAGNOSIS — M6281 Muscle weakness (generalized): Secondary | ICD-10-CM | POA: Diagnosis not present

## 2014-05-19 DIAGNOSIS — N183 Chronic kidney disease, stage 3 (moderate): Secondary | ICD-10-CM | POA: Diagnosis not present

## 2014-05-19 DIAGNOSIS — E1122 Type 2 diabetes mellitus with diabetic chronic kidney disease: Secondary | ICD-10-CM | POA: Diagnosis not present

## 2014-05-19 DIAGNOSIS — S32028D Other fracture of second lumbar vertebra, subsequent encounter for fracture with routine healing: Secondary | ICD-10-CM | POA: Diagnosis not present

## 2014-05-19 DIAGNOSIS — J449 Chronic obstructive pulmonary disease, unspecified: Secondary | ICD-10-CM | POA: Diagnosis not present

## 2014-05-19 DIAGNOSIS — M5090 Cervical disc disorder, unspecified, unspecified cervical region: Secondary | ICD-10-CM | POA: Diagnosis not present

## 2014-05-19 NOTE — Addendum Note (Signed)
Addended by: Nelda SevereMAY, Deslyn Cavenaugh A on: 05/19/2014 10:33 AM   Modules accepted: Orders

## 2014-05-21 ENCOUNTER — Other Ambulatory Visit: Payer: Self-pay | Admitting: *Deleted

## 2014-05-21 DIAGNOSIS — E1122 Type 2 diabetes mellitus with diabetic chronic kidney disease: Secondary | ICD-10-CM | POA: Diagnosis not present

## 2014-05-21 DIAGNOSIS — F909 Attention-deficit hyperactivity disorder, unspecified type: Secondary | ICD-10-CM

## 2014-05-21 DIAGNOSIS — J449 Chronic obstructive pulmonary disease, unspecified: Secondary | ICD-10-CM | POA: Diagnosis not present

## 2014-05-21 DIAGNOSIS — M5090 Cervical disc disorder, unspecified, unspecified cervical region: Secondary | ICD-10-CM | POA: Diagnosis not present

## 2014-05-21 DIAGNOSIS — S32028D Other fracture of second lumbar vertebra, subsequent encounter for fracture with routine healing: Secondary | ICD-10-CM | POA: Diagnosis not present

## 2014-05-21 DIAGNOSIS — N183 Chronic kidney disease, stage 3 (moderate): Secondary | ICD-10-CM | POA: Diagnosis not present

## 2014-05-21 DIAGNOSIS — M6281 Muscle weakness (generalized): Secondary | ICD-10-CM | POA: Diagnosis not present

## 2014-05-21 MED ORDER — AMPHETAMINE-DEXTROAMPHETAMINE 5 MG PO TABS: ORAL_TABLET | ORAL | Status: DC

## 2014-05-21 NOTE — Telephone Encounter (Signed)
[facility]

## 2014-05-24 DIAGNOSIS — H1131 Conjunctival hemorrhage, right eye: Secondary | ICD-10-CM | POA: Diagnosis not present

## 2014-05-25 DIAGNOSIS — N183 Chronic kidney disease, stage 3 (moderate): Secondary | ICD-10-CM | POA: Diagnosis not present

## 2014-05-25 DIAGNOSIS — M6281 Muscle weakness (generalized): Secondary | ICD-10-CM | POA: Diagnosis not present

## 2014-05-25 DIAGNOSIS — J449 Chronic obstructive pulmonary disease, unspecified: Secondary | ICD-10-CM | POA: Diagnosis not present

## 2014-05-25 DIAGNOSIS — M5090 Cervical disc disorder, unspecified, unspecified cervical region: Secondary | ICD-10-CM | POA: Diagnosis not present

## 2014-05-25 DIAGNOSIS — E1122 Type 2 diabetes mellitus with diabetic chronic kidney disease: Secondary | ICD-10-CM | POA: Diagnosis not present

## 2014-05-25 DIAGNOSIS — S32028D Other fracture of second lumbar vertebra, subsequent encounter for fracture with routine healing: Secondary | ICD-10-CM | POA: Diagnosis not present

## 2014-05-26 ENCOUNTER — Ambulatory Visit (INDEPENDENT_AMBULATORY_CARE_PROVIDER_SITE_OTHER): Payer: Medicare Other | Admitting: Internal Medicine

## 2014-05-26 ENCOUNTER — Encounter: Payer: Self-pay | Admitting: Internal Medicine

## 2014-05-26 VITALS — BP 140/90 | HR 100 | Temp 97.2°F | Resp 20 | Ht 65.0 in | Wt 153.8 lb

## 2014-05-26 DIAGNOSIS — K5900 Constipation, unspecified: Secondary | ICD-10-CM

## 2014-05-26 DIAGNOSIS — I10 Essential (primary) hypertension: Secondary | ICD-10-CM

## 2014-05-26 MED ORDER — AMLODIPINE BESYLATE 10 MG PO TABS: 10.0000 mg | ORAL_TABLET | Freq: Every day | ORAL | Status: DC

## 2014-05-26 NOTE — Progress Notes (Signed)
Patient ID: Christine Pierce, female   DOB: [birth year], 81 y.o.   MRN: [id number]    Chief Complaint  Patient presents with  . Acute Visit    BP running high, having spiking episodes   Allergies  Allergen Reactions  . Macrodantin [Nitrofurantoin Macrocrystal]     Affects kidneys  . Proventil [Albuterol]     Patient unable to coordinate use of inhaler   HPI 81 y/o female pt is here for acute concerns. She has been having elevated bp readings- 180/97, 140/90. She is currently on amlodipine 5 mg daily She is on miralax 17 g daily for her constipation and this is causing her to have loose stool Denies any other concerns  ROS Denies chest pain or dyspnea Denies abdominal pain, nausea or vomiting Denies dyspnea Denies palpitations Denies abdominal pain, dysuria Denies headache or blurry vision Denies stress/ trauma  Past Medical History  Diagnosis Date  . Hypertension 10/23/2011  . Cystocele, midline 04/10/2012  . Cough 04/10/2012  . Full incontinence of feces 04/10/2012  . Urgency of urination 04/10/2012  . Abdominal pain, other specified site   . Orthopnea 12/01/2011  . Other and unspecified hyperlipidemia 10/20/2011  . Urinary tract infection, site not specified 09/11/2011  . Muscle weakness (generalized) 09/11/2011  . Syncope and collapse 03/29/2011  . Pain in joint, ankle and foot 12/27/2010  . Sprain of ribs 08/15/2010  . Unspecified tinnitus 07/04/2010  . Pneumonia, organism unspecified 05/04/2010  . Diverticulosis of colon (without mention of hemorrhage)   . Other and unspecified ovarian cyst 05/04/2010  . Abdominal pain, right lower quadrant 05/02/2010  . Anxiety state, unspecified 02/14/2010  . Shortness of breath 01/05/2010  . Pain in joint, shoulder region 11/15/2009  . Pain in joint, pelvic region and thigh 07/29/2009  . Personal history of fall 07/29/2009  . Other malaise and fatigue 02/15/2009  . Obesity, unspecified 07/16/2008  . Unspecified hereditary  and idiopathic peripheral neuropathy   . Reflux esophagitis 10/18/2007  . Unspecified vitamin D deficiency 07/19/2007  . Chest pain, unspecified 07/19/2007  . Varicose veins of lower extremities with inflammation 04/25/2006  . Restless legs syndrome (RLS) 01/16/2006  . Nocturia 07/07/2005  . Cervicalgia 01/20/2004  . Osteoporosis, unspecified 01/23/2003  . Sciatica 04/03/2001  . Pain in joint, lower leg 12/11/2001  . Lumbago 02/20/2001  . Hard of hearing     both ears  . Chronic kidney disease, stage III (moderate) 12/15/09  . Acute kidney failure, unspecified 12/10/2009  . Hydronephrosis 07/16/2008  . Anxiety   . Depression   . Abnormality of gait 12/17/2012  . Compression fracture of L2   . COPD (chronic obstructive pulmonary disease) 03/26/2013  . Diabetes mellitus with renal manifestations, controlled 12/17/2013  . Dysphagia, pharyngoesophageal phase 07/22/2013  . Hyperlipidemia 04/28/2014  . Memory deficits 05/08/2013  . Abnormal CT scan of lung 09/04/2012    Linear area of increased density at the posterior aspect of the superior segment left lower lobe appears slightly less prominent on the current exam suggesting atelectasis. No definite new or progressive findings are seen which are worrisome for tumor.   . ADHD (attention deficit hyperactivity disorder) 01/27/2014  . Anal fissure 10/21/2013  . Anxiety state 10/21/2013  . Back pain   . Cerebral atrophy 04/29/2014  . Cerebrovascular disease 04/29/2014  . Constipation 10/21/2013  . Fatigue 09/04/2012  . History of DVT (deep vein thrombosis) right leg 04/29/2014  . History of pulmonary embolism 02/13/2014  . Hoarding behavior 05/08/2013  .  Pain in hip 10/22/2012  . Right hip pain 02/27/2014  . Tremor 10/21/2013    Bilateral fine quiver   . Upper airway cough syndrome 09/17/2013    Followed in Pulmonary clinic/ [facility]/ [facility]  - hfa 0% 09/17/2013 so try off all inhalers and change losartan to alternative arb > no worse off all  inhalers 12/01/2013    . UTI (urinary tract infection) 09/04/2012  . Varicose veins 07/22/2013   Current Outpatient Prescriptions on File Prior to Visit  Medication Sig Dispense Refill  . acetaminophen (TYLENOL) 500 MG tablet 1000 mg twice daily, 1 by mouth every 4 hours as needed for pain    . amphetamine-dextroamphetamine (ADDERALL) 5 MG tablet 1 tablet each morning to assist with ADHD control 30 tablet 0  . calcitonin, salmon, (MIACALCIN/FORTICAL) 200 UNIT/ACT nasal spray Place 1 spray into alternate nostrils every other day.     Marland Kitchen. dextromethorphan (DELSYM) 30 MG/5ML liquid Take 15 mg by mouth as needed for cough.    . famotidine (PEPCID) 20 MG tablet One twice daily to suppress stomach acid (Patient taking differently: Take one tablet by mouth twice daily one hour before meals at 6am and 4pm) 60 tablet 5  . lovastatin (MEVACOR) 40 MG tablet Take 40 mg by mouth every morning.    . Melatonin 3 MG CAPS Take 3 mg by mouth at bedtime as needed (Sleep).    . Misc. Devices (ROLLER WALKER) MISC Rolling Walker with seat and brakes. 1 each 0  . Multiple Vitamin (MULTIVITAMIN WITH MINERALS) TABS tablet Take 1 tablet by mouth daily.    . Multiple Vitamins-Minerals (SUPER THERA VITE M PO) Take 1 tablet by mouth daily.    . nitroGLYCERIN (NITROSTAT) 0.4 MG SL tablet Place 0.4 mg under the tongue every 5 (five) minutes as needed for chest pain.    Marland Kitchen. omeprazole (PRILOSEC) 40 MG capsule Take 40 mg by mouth 2 (two) times daily.    . phenol (CHLORASEPTIC) 1.4 % LIQD Use as directed 1 spray in the mouth or throat as needed for throat irritation / pain.    . polyethylene glycol (MIRALAX / GLYCOLAX) packet Take 17 g by mouth daily as needed for mild constipation. 14 each 0  . rivaroxaban (XARELTO) 20 MG TABS tablet Take 1 tablet (20 mg total) by mouth daily with supper. 30 tablet   . senna-docusate (SENOKOT-S) 8.6-50 MG per tablet Take 2 tablets by mouth at bedtime. (Patient taking differently: Take 1 tablet by mouth  at bedtime. ) 30 tablet 5   No current facility-administered medications on file prior to visit.    Physical exam BP 140/90 mmHg  Pulse 100  Temp(Src) 97.2 F (36.2 C) (Oral)  Resp 20  Ht 5\' 5"  (1.651 m)  Wt 153 lb 12.8 oz (69.763 kg)  BMI 25.59 kg/m2  SpO2 98%  General- elderly female in no acute distress Head- atraumatic, normocephalic Eyes- right resolving conjunctival hemorrhage no pallor, no icterus Neck- no lymphadenopathy Cardiovascular- normal s1,s2, no murmurs Respiratory- bilateral clear to auscultation, no wheeze, no rhonchi, no crackles Abdomen- bowel sounds present, soft, non tender Musculoskeletal- able to move all 4 extremities, uses walker Neurological- no focal deficit  Assessment/plan  Hypertension Change amlodipine to 10 mg daily for now and reassess, check bp daily at home and reassess in office in f/u appointment with dr green in 2 weeks  Constipation Change miralax to 17 g po daily prn for now and monitor

## 2014-05-27 DIAGNOSIS — J449 Chronic obstructive pulmonary disease, unspecified: Secondary | ICD-10-CM | POA: Diagnosis not present

## 2014-05-27 DIAGNOSIS — M6281 Muscle weakness (generalized): Secondary | ICD-10-CM | POA: Diagnosis not present

## 2014-05-27 DIAGNOSIS — E1122 Type 2 diabetes mellitus with diabetic chronic kidney disease: Secondary | ICD-10-CM | POA: Diagnosis not present

## 2014-05-27 DIAGNOSIS — S32028D Other fracture of second lumbar vertebra, subsequent encounter for fracture with routine healing: Secondary | ICD-10-CM | POA: Diagnosis not present

## 2014-05-27 DIAGNOSIS — N183 Chronic kidney disease, stage 3 (moderate): Secondary | ICD-10-CM | POA: Diagnosis not present

## 2014-05-27 DIAGNOSIS — M5090 Cervical disc disorder, unspecified, unspecified cervical region: Secondary | ICD-10-CM | POA: Diagnosis not present

## 2014-05-28 DIAGNOSIS — M5090 Cervical disc disorder, unspecified, unspecified cervical region: Secondary | ICD-10-CM | POA: Diagnosis not present

## 2014-05-28 DIAGNOSIS — J449 Chronic obstructive pulmonary disease, unspecified: Secondary | ICD-10-CM | POA: Diagnosis not present

## 2014-05-28 DIAGNOSIS — M6281 Muscle weakness (generalized): Secondary | ICD-10-CM | POA: Diagnosis not present

## 2014-05-28 DIAGNOSIS — E1122 Type 2 diabetes mellitus with diabetic chronic kidney disease: Secondary | ICD-10-CM | POA: Diagnosis not present

## 2014-05-28 DIAGNOSIS — S32028D Other fracture of second lumbar vertebra, subsequent encounter for fracture with routine healing: Secondary | ICD-10-CM | POA: Diagnosis not present

## 2014-05-28 DIAGNOSIS — N183 Chronic kidney disease, stage 3 (moderate): Secondary | ICD-10-CM | POA: Diagnosis not present

## 2014-06-01 DIAGNOSIS — N183 Chronic kidney disease, stage 3 (moderate): Secondary | ICD-10-CM | POA: Diagnosis not present

## 2014-06-01 DIAGNOSIS — N2581 Secondary hyperparathyroidism of renal origin: Secondary | ICD-10-CM | POA: Diagnosis not present

## 2014-06-01 DIAGNOSIS — N390 Urinary tract infection, site not specified: Secondary | ICD-10-CM | POA: Diagnosis not present

## 2014-06-01 DIAGNOSIS — D631 Anemia in chronic kidney disease: Secondary | ICD-10-CM | POA: Diagnosis not present

## 2014-06-01 DIAGNOSIS — I129 Hypertensive chronic kidney disease with stage 1 through stage 4 chronic kidney disease, or unspecified chronic kidney disease: Secondary | ICD-10-CM | POA: Diagnosis not present

## 2014-06-02 DIAGNOSIS — R260 Ataxic gait: Secondary | ICD-10-CM | POA: Diagnosis not present

## 2014-06-02 DIAGNOSIS — M810 Age-related osteoporosis without current pathological fracture: Secondary | ICD-10-CM | POA: Diagnosis not present

## 2014-06-02 DIAGNOSIS — R262 Difficulty in walking, not elsewhere classified: Secondary | ICD-10-CM | POA: Diagnosis not present

## 2014-06-02 DIAGNOSIS — M6281 Muscle weakness (generalized): Secondary | ICD-10-CM | POA: Diagnosis not present

## 2014-06-02 DIAGNOSIS — S32020A Wedge compression fracture of second lumbar vertebra, initial encounter for closed fracture: Secondary | ICD-10-CM | POA: Diagnosis not present

## 2014-06-03 DIAGNOSIS — R262 Difficulty in walking, not elsewhere classified: Secondary | ICD-10-CM | POA: Diagnosis not present

## 2014-06-03 DIAGNOSIS — M6281 Muscle weakness (generalized): Secondary | ICD-10-CM | POA: Diagnosis not present

## 2014-06-03 DIAGNOSIS — S32020A Wedge compression fracture of second lumbar vertebra, initial encounter for closed fracture: Secondary | ICD-10-CM | POA: Diagnosis not present

## 2014-06-03 DIAGNOSIS — R260 Ataxic gait: Secondary | ICD-10-CM | POA: Diagnosis not present

## 2014-06-04 DIAGNOSIS — R262 Difficulty in walking, not elsewhere classified: Secondary | ICD-10-CM | POA: Diagnosis not present

## 2014-06-04 DIAGNOSIS — R260 Ataxic gait: Secondary | ICD-10-CM | POA: Diagnosis not present

## 2014-06-04 DIAGNOSIS — S32020A Wedge compression fracture of second lumbar vertebra, initial encounter for closed fracture: Secondary | ICD-10-CM | POA: Diagnosis not present

## 2014-06-04 DIAGNOSIS — M6281 Muscle weakness (generalized): Secondary | ICD-10-CM | POA: Diagnosis not present

## 2014-06-05 DIAGNOSIS — R262 Difficulty in walking, not elsewhere classified: Secondary | ICD-10-CM | POA: Diagnosis not present

## 2014-06-05 DIAGNOSIS — M6281 Muscle weakness (generalized): Secondary | ICD-10-CM | POA: Diagnosis not present

## 2014-06-05 DIAGNOSIS — R260 Ataxic gait: Secondary | ICD-10-CM | POA: Diagnosis not present

## 2014-06-05 DIAGNOSIS — S32020A Wedge compression fracture of second lumbar vertebra, initial encounter for closed fracture: Secondary | ICD-10-CM | POA: Diagnosis not present

## 2014-06-09 ENCOUNTER — Ambulatory Visit (INDEPENDENT_AMBULATORY_CARE_PROVIDER_SITE_OTHER): Payer: Medicare Other | Admitting: Internal Medicine

## 2014-06-09 ENCOUNTER — Encounter: Payer: Self-pay | Admitting: Internal Medicine

## 2014-06-09 ENCOUNTER — Telehealth: Payer: Self-pay | Admitting: *Deleted

## 2014-06-09 VITALS — BP 148/78 | HR 84 | Temp 96.9°F | Resp 20 | Ht 63.78 in | Wt 156.8 lb

## 2014-06-09 DIAGNOSIS — S32020D Wedge compression fracture of second lumbar vertebra, subsequent encounter for fracture with routine healing: Secondary | ICD-10-CM | POA: Diagnosis not present

## 2014-06-09 DIAGNOSIS — Z86711 Personal history of pulmonary embolism: Secondary | ICD-10-CM | POA: Diagnosis not present

## 2014-06-09 DIAGNOSIS — M810 Age-related osteoporosis without current pathological fracture: Secondary | ICD-10-CM | POA: Diagnosis not present

## 2014-06-09 DIAGNOSIS — Z86718 Personal history of other venous thrombosis and embolism: Secondary | ICD-10-CM | POA: Diagnosis not present

## 2014-06-09 DIAGNOSIS — K5901 Slow transit constipation: Secondary | ICD-10-CM

## 2014-06-09 DIAGNOSIS — N182 Chronic kidney disease, stage 2 (mild): Secondary | ICD-10-CM

## 2014-06-09 DIAGNOSIS — M5442 Lumbago with sciatica, left side: Secondary | ICD-10-CM

## 2014-06-09 DIAGNOSIS — N393 Stress incontinence (female) (male): Secondary | ICD-10-CM | POA: Diagnosis not present

## 2014-06-09 DIAGNOSIS — R05 Cough: Secondary | ICD-10-CM | POA: Diagnosis not present

## 2014-06-09 DIAGNOSIS — K5900 Constipation, unspecified: Secondary | ICD-10-CM | POA: Insufficient documentation

## 2014-06-09 DIAGNOSIS — I10 Essential (primary) hypertension: Secondary | ICD-10-CM

## 2014-06-09 DIAGNOSIS — R32 Unspecified urinary incontinence: Secondary | ICD-10-CM | POA: Insufficient documentation

## 2014-06-09 DIAGNOSIS — R058 Other specified cough: Secondary | ICD-10-CM

## 2014-06-09 MED ORDER — LOSARTAN POTASSIUM 50 MG PO TABS: 50.0000 mg | ORAL_TABLET | Freq: Every day | ORAL | Status: DC

## 2014-06-09 MED ORDER — DEXTROMETHORPHAN POLISTIREX 30 MG/5ML PO LQCR: ORAL | Status: DC

## 2014-06-09 MED ORDER — MIRABEGRON ER 25 MG PO TB24: ORAL_TABLET | ORAL | Status: DC

## 2014-06-09 MED ORDER — SENNOSIDES-DOCUSATE SODIUM 8.6-50 MG PO TABS: ORAL_TABLET | ORAL | Status: AC

## 2014-06-09 NOTE — Telephone Encounter (Signed)
Bukky with [facility] called and stated that patient's BP is high this morning--Right Arm: 202/90 Pulse 77 and Left Arm: 183/89 Pulse 77.Patient has an appointment today with Dr. Chilton SiGreen. Printed and placed in patient's chart.

## 2014-06-09 NOTE — Progress Notes (Signed)
Patient ID: Christine Pierce, female   DOB: January 30, 1922, 81 y.o.   MRN: [id number]    Facility  [facility]    Place of Service:   OFFICE   Allergies  Allergen Reactions  . Macrodantin [Nitrofurantoin Macrocrystal]     Affects kidneys  . Proventil [Albuterol]     Patient unable to coordinate use of inhaler    Chief Complaint  Patient presents with  . Medical Management of Chronic Issues    HPI:  Essential hypertension - patient had significant elevations in blood pressure today with systolic pressures over 025 and diastolic over 427. This is happened a couple times in the last month. She has been getting her antihypertensive medications regularly. She denies feeling ill when her blood pressure rises. She denies headache, palpitations, chest discomfort, shortness of breath, nausea, or increase in her chronic pains.  History of DVT (deep vein thrombosis) right leg: Anticoagulated with Xarelto  CKD (chronic kidney disease) stage 2, GFR 60-89 ml/min: Stable  Compression fracture of L2, with routine healing, subsequent encounter: Chronic back pain  Osteoporosis: Osteopenia and osteoporosis confirmed recently on bone density. She is getting calcitonin injections and calcium supplements.  History of pulmonary embolism: Anticoagulated with Xarelto  Left-sided low back pain with left-sided sciatica: Chronic problem present for over 2 years  Upper airway cough syndrome - : Seems to be improving with the use of dextromethorphan (DELSYM) 30 MG/5ML liquid  Slow transit constipation - responds to senna-docusate (SENOKOT-S) 8.6-50 MG per tablet  Incontinence in female -having difficulty with urgency and incontinence. She is wetting herself. She has frequency of urination at night. She is requesting a medication to help control this.    Medications: Patient's Medications  New Prescriptions   No medications on file  Previous Medications   ACETAMINOPHEN (TYLENOL) 500 MG TABLET    1000 mg twice  daily, 1 by mouth every 4 hours as needed for pain   AMLODIPINE (NORVASC) 10 MG TABLET    Take 1 tablet (10 mg total) by mouth daily.   AMPHETAMINE-DEXTROAMPHETAMINE (ADDERALL) 5 MG TABLET    1 tablet each morning to assist with ADHD control   CALCITONIN, SALMON, (MIACALCIN/FORTICAL) 200 UNIT/ACT NASAL SPRAY    Place 1 spray into alternate nostrils every other day.    DEXTROMETHORPHAN (DELSYM) 30 MG/5ML LIQUID    Take 15 mg by mouth as needed for cough.   FAMOTIDINE (PEPCID) 20 MG TABLET    One twice daily to suppress stomach acid   LOVASTATIN (MEVACOR) 40 MG TABLET    Take 40 mg by mouth every morning.   MELATONIN 5 MG CAPS    Take 1 capsule by mouth at bedtime as needed.   MISC. DEVICES (ROLLER WALKER) MISC    Rolling Walker with seat and brakes.   MULTIPLE VITAMIN (MULTIVITAMIN WITH MINERALS) TABS TABLET    Take 1 tablet by mouth daily.   MULTIPLE VITAMINS-MINERALS (SUPER THERA VITE M PO)    Take 1 tablet by mouth daily.   NITROGLYCERIN (NITROSTAT) 0.4 MG SL TABLET    Place 0.4 mg under the tongue every 5 (five) minutes as needed for chest pain.   OMEPRAZOLE (PRILOSEC) 40 MG CAPSULE    Take 40 mg by mouth 2 (two) times daily.   PHENOL (CHLORASEPTIC) 1.4 % LIQD    Use as directed 1 spray in the mouth or throat as needed for throat irritation / pain.   POLYETHYLENE GLYCOL (MIRALAX / GLYCOLAX) PACKET    Take 17 g by  mouth daily as needed for mild constipation.   RIVAROXABAN (XARELTO) 20 MG TABS TABLET    Take 1 tablet (20 mg total) by mouth daily with supper.   SENNA-DOCUSATE (SENOKOT-S) 8.6-50 MG PER TABLET    Take 2 tablets by mouth at bedtime.  Modified Medications   No medications on file  Discontinued Medications   MELATONIN 3 MG CAPS    Take 3 mg by mouth at bedtime as needed (Sleep).     Review of Systems  Constitutional: Positive for fatigue. Negative for fever, chills, diaphoresis, activity change, appetite change and unexpected weight change.  HENT: Positive for hearing loss.     Eyes: Negative.   Respiratory: Positive for cough and shortness of breath.        Complains of a hoarse voice for the last few months. There is no pain.  Cardiovascular: Positive for leg swelling. Negative for palpitations.  Gastrointestinal: Positive for constipation and rectal pain.       Occasional fecal incontinence. Sometimes has difficulty swallowing. Solids and liquids seem to be involved. There is no odynophagia.  Endocrine:       Elevated blood sugars. Currently on "dietary control". She is not following her diet closely.  Genitourinary: Positive for urgency and frequency. Negative for dysuria, decreased urine volume, vaginal bleeding, vaginal discharge, enuresis, genital sores, vaginal pain and pelvic pain.       UTI 02/17/14. Incontinent of urine.  Musculoskeletal:       Generalized weakness: Pain in the right hip area. Requires assistance in getting up and down. At risk for falls.  Skin: Negative.   Allergic/Immunologic: Negative.   Neurological: Positive for weakness.       Memory loss.  Hematological: Negative.   Psychiatric/Behavioral: Positive for confusion. The patient is nervous/anxious.        History of hoarding behavior.Past history of [employer] buying of products on television.    Filed Vitals:   06/09/14 1514  BP: 148/78  Pulse: 84  Temp: 96.9 F (36.1 C)  TempSrc: Oral  Resp: 20  Height: 5' 3.78" (1.62 m)  Weight: 156 lb 12.8 oz (71.124 kg)  SpO2: 96%   Body mass index is 27.1 kg/(m^2).  Physical Exam  Constitutional: She is oriented to person, place, and time. No distress.  Overweight  HENT:  Head: Atraumatic.  Partial deafness. Uses hearing aids.  Eyes:  Wears prescription lenses.  Neck: No JVD present. No tracheal deviation present. No thyromegaly present.  Some tenderness with movement laterally to either side.  Cardiovascular: Normal rate and regular rhythm.  Exam reveals no friction rub.   Murmur (3/6 SEM at LSB and aortic ejection)  heard. Bilateral varicose veins  Pulmonary/Chest: No respiratory distress. She has no wheezes. She has no rales. She exhibits tenderness (left lower chest wall).  Tender left flank area lower rib margins laterally and slightly anteriorly. Voice is slightly hoarse.  Abdominal: Soft. Bowel sounds are normal. She exhibits no distension and no mass. There is no tenderness.  Genitourinary: Guaiac negative stool.  Anal fissure at 6 o'clock  Musculoskeletal: Normal range of motion. She exhibits no edema or tenderness.  Tender the greater trochanter and right groin. She is hobbled by this decrease in her stride length.  Lymphadenopathy:    She has no cervical adenopathy.  Neurological: She is alert and oriented to person, place, and time. No cranial nerve deficit. She exhibits normal muscle tone. Coordination normal.  04/30/12 MMSE 27/30. Passed clock drawing. Having some trouble in sequencing  events and in telling a coherent story.  Insensitive to vibration in the feet.  Skin: No rash noted. She is not diaphoretic. No erythema. No pallor.  Psychiatric: She has a normal mood and affect. Her behavior is normal. Judgment and thought content normal.     Labs reviewed: No visits with results within 3 Month(s) from this visit. Latest known visit with results is:  Admission on 02/27/2014, Discharged on 03/01/2014  Component Date Value Ref Range Status  . WBC 02/27/2014 13.9* 4.0 - 10.5 K/uL Final  . RBC 02/27/2014 3.71* 3.87 - 5.11 MIL/uL Final  . Hemoglobin 02/27/2014 11.1* 12.0 - 15.0 g/dL Final  . HCT 02/27/2014 33.9* 36.0 - 46.0 % Final  . MCV 02/27/2014 91.4  78.0 - 100.0 fL Final  . MCH 02/27/2014 29.9  26.0 - 34.0 pg Final  . MCHC 02/27/2014 32.7  30.0 - 36.0 g/dL Final  . RDW 02/27/2014 13.9  11.5 - 15.5 % Final  . Platelets 02/27/2014 198  150 - 400 K/uL Final  . Neutrophils Relative % 02/27/2014 75  43 - 77 % Final  . Neutro Abs 02/27/2014 10.5* 1.7 - 7.7 K/uL Final  . Lymphocytes  Relative 02/27/2014 12  12 - 46 % Final  . Lymphs Abs 02/27/2014 1.6  0.7 - 4.0 K/uL Final  . Monocytes Relative 02/27/2014 12  3 - 12 % Final  . Monocytes Absolute 02/27/2014 1.7* 0.1 - 1.0 K/uL Final  . Eosinophils Relative 02/27/2014 1  0 - 5 % Final  . Eosinophils Absolute 02/27/2014 0.1  0.0 - 0.7 K/uL Final  . Basophils Relative 02/27/2014 0  0 - 1 % Final  . Basophils Absolute 02/27/2014 0.0  0.0 - 0.1 K/uL Final  . Sodium 02/27/2014 134* 135 - 145 mmol/L Final   Please note change in reference range.  . Potassium 02/27/2014 3.1* 3.5 - 5.1 mmol/L Final   Please note change in reference range.  . Chloride 02/27/2014 99  96 - 112 mEq/L Final  . CO2 02/27/2014 22  19 - 32 mmol/L Final  . Glucose, Bld 02/27/2014 137* 70 - 99 mg/dL Final  . BUN 02/27/2014 61* 6 - 23 mg/dL Final  . Creatinine, Ser 02/27/2014 1.99* 0.50 - 1.10 mg/dL Final  . Calcium 02/27/2014 8.5  8.4 - 10.5 mg/dL Final  . Total Protein 02/27/2014 5.4* 6.0 - 8.3 g/dL Final  . Albumin 02/27/2014 3.0* 3.5 - 5.2 g/dL Final  . AST 02/27/2014 23  0 - 37 U/L Final  . ALT 02/27/2014 20  0 - 35 U/L Final  . Alkaline Phosphatase 02/27/2014 72  39 - 117 U/L Final  . Total Bilirubin 02/27/2014 0.7  0.3 - 1.2 mg/dL Final  . GFR calc non Af Amer 02/27/2014 21* >90 mL/min Final  . GFR calc Af Amer 02/27/2014 24* >90 mL/min Final   Comment: (NOTE) The eGFR has been calculated using the CKD EPI equation. This calculation has not been validated in all clinical situations. eGFR's persistently <90 mL/min signify possible Chronic Kidney Disease.   . Anion gap 02/27/2014 13  5 - 15 Final  . Troponin I 02/27/2014 <0.03  <0.031 ng/mL Final   Comment:        NO INDICATION OF MYOCARDIAL INJURY. Please note change in reference range.   . Prothrombin Time 02/27/2014 16.5* 11.6 - 15.2 seconds Final  . INR 02/27/2014 1.31  0.00 - 1.49 Final  . D-Dimer, Quant 02/27/2014 >20.00* 0.00 - 0.48 ug/mL-FEU Final   Comment:  AT THE  INHOUSE ESTABLISHED CUTOFF VALUE OF 0.48 ug/mL FEU, THIS ASSAY HAS BEEN DOCUMENTED IN THE LITERATURE TO HAVE A SENSITIVITY AND NEGATIVE PREDICTIVE VALUE OF AT LEAST 98 TO 99%.  THE TEST RESULT SHOULD BE CORRELATED WITH AN ASSESSMENT OF THE CLINICAL PROBABILITY OF DVT / VTE.   Marland Kitchen Troponin I 02/27/2014 0.06* <0.031 ng/mL Final   Comment:        PERSISTENTLY INCREASED TROPONIN VALUES IN THE RANGE OF 0.04-0.49 ng/mL CAN BE SEEN IN:       -UNSTABLE ANGINA       -CONGESTIVE HEART FAILURE       -MYOCARDITIS       -CHEST TRAUMA       -ARRYHTHMIAS       -LATE PRESENTING MYOCARDIAL INFARCTION       -COPD   CLINICAL FOLLOW-UP RECOMMENDED. Please note change in reference range.   . Troponin I 02/28/2014 0.03  <0.031 ng/mL Final   Comment:        NO INDICATION OF MYOCARDIAL INJURY. Please note change in reference range.   . Heparin Unfractionated 02/28/2014 0.48  0.30 - 0.70 IU/mL Final   Comment:        IF HEPARIN RESULTS ARE BELOW EXPECTED VALUES, AND PATIENT DOSAGE HAS BEEN CONFIRMED, SUGGEST FOLLOW UP TESTING OF ANTITHROMBIN III LEVELS.   Marland Kitchen Sodium 02/28/2014 135  135 - 145 mmol/L Final   Please note change in reference range.  . Potassium 02/28/2014 4.1  3.5 - 5.1 mmol/L Final   Comment: Please note change in reference range. DELTA CHECK NOTED   . Chloride 02/28/2014 102  96 - 112 mEq/L Final  . CO2 02/28/2014 25  19 - 32 mmol/L Final  . Glucose, Bld 02/28/2014 98  70 - 99 mg/dL Final  . BUN 02/28/2014 56* 6 - 23 mg/dL Final  . Creatinine, Ser 02/28/2014 1.65* 0.50 - 1.10 mg/dL Final  . Calcium 02/28/2014 8.7  8.4 - 10.5 mg/dL Final  . GFR calc non Af Amer 02/28/2014 26* >90 mL/min Final  . GFR calc Af Amer 02/28/2014 30* >90 mL/min Final   Comment: (NOTE) The eGFR has been calculated using the CKD EPI equation. This calculation has not been validated in all clinical situations. eGFR's persistently <90 mL/min signify possible Chronic Kidney Disease.   . Anion gap  02/28/2014 8  5 - 15 Final  . WBC 02/28/2014 11.1* 4.0 - 10.5 K/uL Final  . RBC 02/28/2014 3.73* 3.87 - 5.11 MIL/uL Final  . Hemoglobin 02/28/2014 11.1* 12.0 - 15.0 g/dL Final  . HCT 02/28/2014 34.6* 36.0 - 46.0 % Final  . MCV 02/28/2014 92.8  78.0 - 100.0 fL Final  . MCH 02/28/2014 29.8  26.0 - 34.0 pg Final  . MCHC 02/28/2014 32.1  30.0 - 36.0 g/dL Final  . RDW 02/28/2014 14.1  11.5 - 15.5 % Final  . Platelets 02/28/2014 184  150 - 400 K/uL Final  . Prothrombin Time 02/28/2014 16.3* 11.6 - 15.2 seconds Final  . INR 02/28/2014 1.29  0.00 - 1.49 Final  . Troponin I 02/28/2014 0.03  <0.031 ng/mL Final   Comment:        NO INDICATION OF MYOCARDIAL INJURY. Please note change in reference range.   . Heparin Unfractionated 02/28/2014 0.34  0.30 - 0.70 IU/mL Final   Comment:        IF HEPARIN RESULTS ARE BELOW EXPECTED VALUES, AND PATIENT DOSAGE HAS BEEN CONFIRMED, SUGGEST FOLLOW UP TESTING OF ANTITHROMBIN III LEVELS.   Marland Kitchen  Color, Urine 02/28/2014 YELLOW  YELLOW Final  . APPearance 02/28/2014 CLEAR  CLEAR Final  . Specific Gravity, Urine 02/28/2014 1.015  1.005 - 1.030 Final  . pH 02/28/2014 5.5  5.0 - 8.0 Final  . Glucose, UA 02/28/2014 NEGATIVE  NEGATIVE mg/dL Final  . Hgb urine dipstick 02/28/2014 NEGATIVE  NEGATIVE Final  . Bilirubin Urine 02/28/2014 NEGATIVE  NEGATIVE Final  . Ketones, ur 02/28/2014 NEGATIVE  NEGATIVE mg/dL Final  . Protein, ur 02/28/2014 NEGATIVE  NEGATIVE mg/dL Final  . Urobilinogen, UA 02/28/2014 0.2  0.0 - 1.0 mg/dL Final  . Nitrite 02/28/2014 NEGATIVE  NEGATIVE Final  . Leukocytes, UA 02/28/2014 NEGATIVE  NEGATIVE Final   MICROSCOPIC NOT DONE ON URINES WITH NEGATIVE PROTEIN, BLOOD, LEUKOCYTES, NITRITE, OR GLUCOSE <1000 mg/dL.  . WBC 03/01/2014 13.2* 4.0 - 10.5 K/uL Final  . RBC 03/01/2014 3.63* 3.87 - 5.11 MIL/uL Final  . Hemoglobin 03/01/2014 10.8* 12.0 - 15.0 g/dL Final  . HCT 03/01/2014 33.9* 36.0 - 46.0 % Final  . MCV 03/01/2014 93.4  78.0 - 100.0 fL  Final  . MCH 03/01/2014 29.8  26.0 - 34.0 pg Final  . MCHC 03/01/2014 31.9  30.0 - 36.0 g/dL Final  . RDW 03/01/2014 14.0  11.5 - 15.5 % Final  . Platelets 03/01/2014 227  150 - 400 K/uL Final  . Sodium 03/01/2014 136  135 - 145 mmol/L Final   Please note change in reference range.  . Potassium 03/01/2014 4.7  3.5 - 5.1 mmol/L Final   Please note change in reference range.  . Chloride 03/01/2014 105  96 - 112 mEq/L Final  . CO2 03/01/2014 24  19 - 32 mmol/L Final  . Glucose, Bld 03/01/2014 103* 70 - 99 mg/dL Final  . BUN 03/01/2014 28* 6 - 23 mg/dL Final   DELTA CHECK NOTED  . Creatinine, Ser 03/01/2014 1.03  0.50 - 1.10 mg/dL Final   DELTA CHECK NOTED  . Calcium 03/01/2014 8.8  8.4 - 10.5 mg/dL Final  . GFR calc non Af Amer 03/01/2014 46* >90 mL/min Final  . GFR calc Af Amer 03/01/2014 54* >90 mL/min Final   Comment: (NOTE) The eGFR has been calculated using the CKD EPI equation. This calculation has not been validated in all clinical situations. eGFR's persistently <90 mL/min signify possible Chronic Kidney Disease.   . Anion gap 03/01/2014 7  5 - 15 Final  . Color, Urine 03/01/2014 YELLOW  YELLOW Final  . APPearance 03/01/2014 CLEAR  CLEAR Final  . Specific Gravity, Urine 03/01/2014 1.015  1.005 - 1.030 Final  . pH 03/01/2014 6.5  5.0 - 8.0 Final  . Glucose, UA 03/01/2014 NEGATIVE  NEGATIVE mg/dL Final  . Hgb urine dipstick 03/01/2014 NEGATIVE  NEGATIVE Final  . Bilirubin Urine 03/01/2014 NEGATIVE  NEGATIVE Final  . Ketones, ur 03/01/2014 NEGATIVE  NEGATIVE mg/dL Final  . Protein, ur 03/01/2014 NEGATIVE  NEGATIVE mg/dL Final  . Urobilinogen, UA 03/01/2014 1.0  0.0 - 1.0 mg/dL Final  . Nitrite 03/01/2014 NEGATIVE  NEGATIVE Final  . Leukocytes, UA 03/01/2014 NEGATIVE  NEGATIVE Final   MICROSCOPIC NOT DONE ON URINES WITH NEGATIVE PROTEIN, BLOOD, LEUKOCYTES, NITRITE, OR GLUCOSE <1000 mg/dL.     Assessment/Plan 1. Essential hypertension add losartan (COZAAR) 50 MG tablet;  Take 1 tablet (50 mg total) by mouth daily.  Dispense: 30 tablet; Refill: 5  2. History of DVT (deep vein thrombosis) right leg Continue Xarelto till July 2016  3. CKD (chronic kidney disease) stage 2, GFR 60-89 ml/min  Continue to monitor  4. Compression fracture of L2, with routine healing, subsequent encounter Contributes to chronic back discomfort  5. Osteoporosis Continue calcitonin and calcium supplement for the time being. Consider use of Prolia.  6. History of pulmonary embolism Toe until at least July 2016  7. Left-sided low back pain with left-sided sciatica Chronic discomfort. Generally controllable with aspirin. She has had injections in the spine in the past which did seem to help this pain. Injections are contraindicated while she is on Xarelto, but I advised her that if we can stop the Xarelto the summer, then she will get a referral for another injection in her back.  8. Upper airway cough syndrome - dextromethorphan (DELSYM) 30 MG/5ML liquid; 5 cc (30  MG) in the morning if needed for cough  Dispense: 89 mL; Refill: 0  9. Slow transit constipation - senna-docusate (SENOKOT-S) 8.6-50 MG per tablet; One at bed to prevent constipation  Dispense: 30 tablet; Refill: 5  10. Incontinence in female add mirabegron ER (MYRBETRIQ) 25 MG TB24 tablet; One daily to help bladder control  Dispense: 30 tablet; Refill: 5  .

## 2014-06-18 ENCOUNTER — Other Ambulatory Visit: Payer: Self-pay | Admitting: *Deleted

## 2014-06-18 DIAGNOSIS — F909 Attention-deficit hyperactivity disorder, unspecified type: Secondary | ICD-10-CM

## 2014-06-18 MED ORDER — AMPHETAMINE-DEXTROAMPHETAMINE 5 MG PO TABS: ORAL_TABLET | ORAL | Status: DC

## 2014-06-18 NOTE — Telephone Encounter (Signed)
[facility] 

## 2014-06-23 ENCOUNTER — Encounter: Payer: Self-pay | Admitting: Internal Medicine

## 2014-07-05 DIAGNOSIS — M6281 Muscle weakness (generalized): Secondary | ICD-10-CM | POA: Diagnosis not present

## 2014-07-05 DIAGNOSIS — R41841 Cognitive communication deficit: Secondary | ICD-10-CM | POA: Diagnosis not present

## 2014-07-05 DIAGNOSIS — R260 Ataxic gait: Secondary | ICD-10-CM | POA: Diagnosis not present

## 2014-07-05 DIAGNOSIS — S32020A Wedge compression fracture of second lumbar vertebra, initial encounter for closed fracture: Secondary | ICD-10-CM | POA: Diagnosis not present

## 2014-07-06 DIAGNOSIS — M6281 Muscle weakness (generalized): Secondary | ICD-10-CM | POA: Diagnosis not present

## 2014-07-06 DIAGNOSIS — S32020A Wedge compression fracture of second lumbar vertebra, initial encounter for closed fracture: Secondary | ICD-10-CM | POA: Diagnosis not present

## 2014-07-06 DIAGNOSIS — R41841 Cognitive communication deficit: Secondary | ICD-10-CM | POA: Diagnosis not present

## 2014-07-06 DIAGNOSIS — R260 Ataxic gait: Secondary | ICD-10-CM | POA: Diagnosis not present

## 2014-07-07 DIAGNOSIS — M6281 Muscle weakness (generalized): Secondary | ICD-10-CM | POA: Diagnosis not present

## 2014-07-07 DIAGNOSIS — S32020A Wedge compression fracture of second lumbar vertebra, initial encounter for closed fracture: Secondary | ICD-10-CM | POA: Diagnosis not present

## 2014-07-07 DIAGNOSIS — R260 Ataxic gait: Secondary | ICD-10-CM | POA: Diagnosis not present

## 2014-07-07 DIAGNOSIS — R41841 Cognitive communication deficit: Secondary | ICD-10-CM | POA: Diagnosis not present

## 2014-07-08 DIAGNOSIS — R41841 Cognitive communication deficit: Secondary | ICD-10-CM | POA: Diagnosis not present

## 2014-07-08 DIAGNOSIS — R260 Ataxic gait: Secondary | ICD-10-CM | POA: Diagnosis not present

## 2014-07-08 DIAGNOSIS — S32020A Wedge compression fracture of second lumbar vertebra, initial encounter for closed fracture: Secondary | ICD-10-CM | POA: Diagnosis not present

## 2014-07-08 DIAGNOSIS — M6281 Muscle weakness (generalized): Secondary | ICD-10-CM | POA: Diagnosis not present

## 2014-07-10 DIAGNOSIS — M6281 Muscle weakness (generalized): Secondary | ICD-10-CM | POA: Diagnosis not present

## 2014-07-10 DIAGNOSIS — R41841 Cognitive communication deficit: Secondary | ICD-10-CM | POA: Diagnosis not present

## 2014-07-10 DIAGNOSIS — S32020A Wedge compression fracture of second lumbar vertebra, initial encounter for closed fracture: Secondary | ICD-10-CM | POA: Diagnosis not present

## 2014-07-10 DIAGNOSIS — R260 Ataxic gait: Secondary | ICD-10-CM | POA: Diagnosis not present

## 2014-07-14 DIAGNOSIS — S32020A Wedge compression fracture of second lumbar vertebra, initial encounter for closed fracture: Secondary | ICD-10-CM | POA: Diagnosis not present

## 2014-07-14 DIAGNOSIS — M6281 Muscle weakness (generalized): Secondary | ICD-10-CM | POA: Diagnosis not present

## 2014-07-14 DIAGNOSIS — R260 Ataxic gait: Secondary | ICD-10-CM | POA: Diagnosis not present

## 2014-07-14 DIAGNOSIS — R41841 Cognitive communication deficit: Secondary | ICD-10-CM | POA: Diagnosis not present

## 2014-07-15 DIAGNOSIS — R260 Ataxic gait: Secondary | ICD-10-CM | POA: Diagnosis not present

## 2014-07-15 DIAGNOSIS — S32020A Wedge compression fracture of second lumbar vertebra, initial encounter for closed fracture: Secondary | ICD-10-CM | POA: Diagnosis not present

## 2014-07-15 DIAGNOSIS — R41841 Cognitive communication deficit: Secondary | ICD-10-CM | POA: Diagnosis not present

## 2014-07-15 DIAGNOSIS — M6281 Muscle weakness (generalized): Secondary | ICD-10-CM | POA: Diagnosis not present

## 2014-07-16 ENCOUNTER — Other Ambulatory Visit: Payer: Self-pay | Admitting: *Deleted

## 2014-07-16 DIAGNOSIS — M6281 Muscle weakness (generalized): Secondary | ICD-10-CM | POA: Diagnosis not present

## 2014-07-16 DIAGNOSIS — R41841 Cognitive communication deficit: Secondary | ICD-10-CM | POA: Diagnosis not present

## 2014-07-16 DIAGNOSIS — F909 Attention-deficit hyperactivity disorder, unspecified type: Secondary | ICD-10-CM

## 2014-07-16 DIAGNOSIS — S32020A Wedge compression fracture of second lumbar vertebra, initial encounter for closed fracture: Secondary | ICD-10-CM | POA: Diagnosis not present

## 2014-07-16 DIAGNOSIS — R260 Ataxic gait: Secondary | ICD-10-CM | POA: Diagnosis not present

## 2014-07-16 MED ORDER — AMPHETAMINE-DEXTROAMPHETAMINE 5 MG PO TABS: ORAL_TABLET | ORAL | Status: DC

## 2014-07-16 NOTE — Telephone Encounter (Signed)
[facility] 

## 2014-07-17 DIAGNOSIS — M6281 Muscle weakness (generalized): Secondary | ICD-10-CM | POA: Diagnosis not present

## 2014-07-17 DIAGNOSIS — R41841 Cognitive communication deficit: Secondary | ICD-10-CM | POA: Diagnosis not present

## 2014-07-17 DIAGNOSIS — R260 Ataxic gait: Secondary | ICD-10-CM | POA: Diagnosis not present

## 2014-07-17 DIAGNOSIS — S32020A Wedge compression fracture of second lumbar vertebra, initial encounter for closed fracture: Secondary | ICD-10-CM | POA: Diagnosis not present

## 2014-07-20 DIAGNOSIS — R260 Ataxic gait: Secondary | ICD-10-CM | POA: Diagnosis not present

## 2014-07-20 DIAGNOSIS — S32020A Wedge compression fracture of second lumbar vertebra, initial encounter for closed fracture: Secondary | ICD-10-CM | POA: Diagnosis not present

## 2014-07-20 DIAGNOSIS — M6281 Muscle weakness (generalized): Secondary | ICD-10-CM | POA: Diagnosis not present

## 2014-07-20 DIAGNOSIS — R41841 Cognitive communication deficit: Secondary | ICD-10-CM | POA: Diagnosis not present

## 2014-07-21 DIAGNOSIS — M6281 Muscle weakness (generalized): Secondary | ICD-10-CM | POA: Diagnosis not present

## 2014-07-21 DIAGNOSIS — R260 Ataxic gait: Secondary | ICD-10-CM | POA: Diagnosis not present

## 2014-07-21 DIAGNOSIS — S32020A Wedge compression fracture of second lumbar vertebra, initial encounter for closed fracture: Secondary | ICD-10-CM | POA: Diagnosis not present

## 2014-07-21 DIAGNOSIS — R41841 Cognitive communication deficit: Secondary | ICD-10-CM | POA: Diagnosis not present

## 2014-07-22 DIAGNOSIS — M6281 Muscle weakness (generalized): Secondary | ICD-10-CM | POA: Diagnosis not present

## 2014-07-22 DIAGNOSIS — R260 Ataxic gait: Secondary | ICD-10-CM | POA: Diagnosis not present

## 2014-07-22 DIAGNOSIS — R41841 Cognitive communication deficit: Secondary | ICD-10-CM | POA: Diagnosis not present

## 2014-07-22 DIAGNOSIS — S32020A Wedge compression fracture of second lumbar vertebra, initial encounter for closed fracture: Secondary | ICD-10-CM | POA: Diagnosis not present

## 2014-07-23 DIAGNOSIS — S32020A Wedge compression fracture of second lumbar vertebra, initial encounter for closed fracture: Secondary | ICD-10-CM | POA: Diagnosis not present

## 2014-07-23 DIAGNOSIS — R260 Ataxic gait: Secondary | ICD-10-CM | POA: Diagnosis not present

## 2014-07-23 DIAGNOSIS — M6281 Muscle weakness (generalized): Secondary | ICD-10-CM | POA: Diagnosis not present

## 2014-07-23 DIAGNOSIS — R41841 Cognitive communication deficit: Secondary | ICD-10-CM | POA: Diagnosis not present

## 2014-07-24 DIAGNOSIS — S32020A Wedge compression fracture of second lumbar vertebra, initial encounter for closed fracture: Secondary | ICD-10-CM | POA: Diagnosis not present

## 2014-07-24 DIAGNOSIS — M6281 Muscle weakness (generalized): Secondary | ICD-10-CM | POA: Diagnosis not present

## 2014-07-24 DIAGNOSIS — R260 Ataxic gait: Secondary | ICD-10-CM | POA: Diagnosis not present

## 2014-07-24 DIAGNOSIS — R41841 Cognitive communication deficit: Secondary | ICD-10-CM | POA: Diagnosis not present

## 2014-07-27 DIAGNOSIS — R41841 Cognitive communication deficit: Secondary | ICD-10-CM | POA: Diagnosis not present

## 2014-07-27 DIAGNOSIS — R260 Ataxic gait: Secondary | ICD-10-CM | POA: Diagnosis not present

## 2014-07-27 DIAGNOSIS — M6281 Muscle weakness (generalized): Secondary | ICD-10-CM | POA: Diagnosis not present

## 2014-07-27 DIAGNOSIS — S32020A Wedge compression fracture of second lumbar vertebra, initial encounter for closed fracture: Secondary | ICD-10-CM | POA: Diagnosis not present

## 2014-07-28 ENCOUNTER — Ambulatory Visit (INDEPENDENT_AMBULATORY_CARE_PROVIDER_SITE_OTHER): Payer: Medicare Other | Admitting: Internal Medicine

## 2014-07-28 ENCOUNTER — Encounter: Payer: Self-pay | Admitting: Internal Medicine

## 2014-07-28 VITALS — BP 142/88 | HR 101 | Temp 97.6°F | Resp 18 | Ht 63.0 in | Wt 161.0 lb

## 2014-07-28 DIAGNOSIS — R1012 Left upper quadrant pain: Secondary | ICD-10-CM | POA: Diagnosis not present

## 2014-07-28 DIAGNOSIS — M25551 Pain in right hip: Secondary | ICD-10-CM | POA: Diagnosis not present

## 2014-07-28 DIAGNOSIS — R269 Unspecified abnormalities of gait and mobility: Secondary | ICD-10-CM | POA: Diagnosis not present

## 2014-07-28 DIAGNOSIS — F411 Generalized anxiety disorder: Secondary | ICD-10-CM | POA: Diagnosis not present

## 2014-07-28 DIAGNOSIS — S32020D Wedge compression fracture of second lumbar vertebra, subsequent encounter for fracture with routine healing: Secondary | ICD-10-CM

## 2014-07-28 DIAGNOSIS — I10 Essential (primary) hypertension: Secondary | ICD-10-CM | POA: Diagnosis not present

## 2014-07-28 DIAGNOSIS — M6281 Muscle weakness (generalized): Secondary | ICD-10-CM | POA: Diagnosis not present

## 2014-07-28 DIAGNOSIS — M5442 Lumbago with sciatica, left side: Secondary | ICD-10-CM

## 2014-07-28 DIAGNOSIS — F42 Obsessive-compulsive disorder: Secondary | ICD-10-CM

## 2014-07-28 DIAGNOSIS — R260 Ataxic gait: Secondary | ICD-10-CM | POA: Diagnosis not present

## 2014-07-28 DIAGNOSIS — F429 Obsessive-compulsive disorder, unspecified: Secondary | ICD-10-CM | POA: Insufficient documentation

## 2014-07-28 DIAGNOSIS — R41841 Cognitive communication deficit: Secondary | ICD-10-CM | POA: Diagnosis not present

## 2014-07-28 DIAGNOSIS — S32020A Wedge compression fracture of second lumbar vertebra, initial encounter for closed fracture: Secondary | ICD-10-CM | POA: Diagnosis not present

## 2014-07-28 MED ORDER — FLUVOXAMINE MALEATE 50 MG PO TABS: ORAL_TABLET | ORAL | Status: DC

## 2014-07-28 NOTE — Progress Notes (Signed)
Patient ID: Christine Pierce, female   DOB: 1921/03/27, 81 y.o.   MRN: [id number]    Facility  [facility]    Place of Service:   OFFICE    Allergies  Allergen Reactions  . Macrodantin [Nitrofurantoin Macrocrystal]     Affects kidneys  . Proventil [Albuterol]     Patient unable to coordinate use of inhaler    Chief Complaint  Patient presents with  . Acute Visit    blood in stool, leg pain    HPI:  Pain in the right leg is worse.  High anxiety again. Mind racing. Calling her friend at early hours. Worried about money. obsessing about various things.  Nocturia. Denies dysuria,. History of UTI in the past. Last was in Dec 2015 with E. Coli.  Not sleeping well.  Had some blood on the stool after defecating. No pain when she had her stool. Only occurred once.  Medications: Patient's Medications  New Prescriptions   No medications on file  Previous Medications   ACETAMINOPHEN (TYLENOL) 500 MG TABLET    1000 mg twice daily, 1 by mouth every 4 hours as needed for pain   AMLODIPINE (NORVASC) 10 MG TABLET    Take 1 tablet (10 mg total) by mouth daily.   CALCITONIN, SALMON, (MIACALCIN/FORTICAL) 200 UNIT/ACT NASAL SPRAY    Place 1 spray into alternate nostrils every other day.    LOSARTAN (COZAAR) 50 MG TABLET    Take 1 tablet (50 mg total) by mouth daily.   MELATONIN 5 MG CAPS    Take 1 capsule by mouth at bedtime as needed.   MIRABEGRON ER (MYRBETRIQ) 25 MG TB24 TABLET    One daily to help bladder control   MISC. DEVICES (ROLLER WALKER) MISC    Rolling Walker with seat and brakes.   MULTIPLE VITAMINS-MINERALS (SUPER THERA VITE M PO)    Take 1 tablet by mouth daily.   NITROGLYCERIN (NITROSTAT) 0.4 MG SL TABLET    Place 0.4 mg under the tongue every 5 (five) minutes as needed for chest pain.   PHENOL (CHLORASEPTIC) 1.4 % LIQD    Use as directed 1 spray in the mouth or throat as needed for throat irritation / pain.   POLYETHYLENE GLYCOL (MIRALAX / GLYCOLAX) PACKET    Take 17 g by mouth  daily as needed for mild constipation.   RIVAROXABAN (XARELTO) 20 MG TABS TABLET    Take 1 tablet (20 mg total) by mouth daily with supper.   SENNA-DOCUSATE (SENOKOT-S) 8.6-50 MG PER TABLET    One at bed to prevent constipation  Modified Medications   No medications on file  Discontinued Medications   AMPHETAMINE-DEXTROAMPHETAMINE (ADDERALL) 5 MG TABLET    Take one tablet by mouth in the morning for ADHD Control   DEXTROMETHORPHAN (DELSYM) 30 MG/5ML LIQUID    5 cc (30  MG) in the morning if needed for cough   LOVASTATIN (MEVACOR) 40 MG TABLET    Take 40 mg by mouth every morning.   MULTIPLE VITAMIN (MULTIVITAMIN WITH MINERALS) TABS TABLET    Take 1 tablet by mouth daily.   OMEPRAZOLE (PRILOSEC) 40 MG CAPSULE    Take 40 mg by mouth 2 (two) times daily. To suppress stomach acid     Review of Systems  Constitutional: Positive for fatigue. Negative for fever, chills, diaphoresis, activity change, appetite change and unexpected weight change.  HENT: Positive for hearing loss.   Eyes: Negative.   Respiratory: Positive for cough and shortness of breath.  Complains of a hoarse voice for the last few months. There is no pain.  Cardiovascular: Positive for leg swelling. Negative for palpitations.  Gastrointestinal: Positive for constipation and rectal pain.       Occasional fecal incontinence. Sometimes has difficulty swallowing. Solids and liquids seem to be involved. There is no odynophagia.  Endocrine:       Elevated blood sugars. Currently on "dietary control". She is not following her diet closely.  Genitourinary: Positive for urgency and frequency. Negative for dysuria, decreased urine volume, vaginal bleeding, vaginal discharge, enuresis, genital sores, vaginal pain and pelvic pain.       UTI 02/17/14. Incontinent of urine.  Musculoskeletal:       Generalized weakness: Pain in the right hip area. Requires assistance in getting up and down. At risk for falls.  Skin: Negative.     Allergic/Immunologic: Negative.   Neurological: Positive for weakness.       Memory loss.  Hematological: Negative.   Psychiatric/Behavioral: Positive for confusion. The patient is nervous/anxious.        History of hoarding behavior.Past history of i[employer] buying of products on television.    Filed Vitals:   07/28/14 1650  BP: 142/88  Pulse: 101  Temp: 97.6 F (36.4 C)  TempSrc: Oral  Resp: 18  Height: '5\' 3"'  (1.6 m)  Weight: 161 lb (73.029 kg)  SpO2: 97%   Body mass index is 28.53 kg/(m^2).  Physical Exam  Constitutional: She is oriented to person, place, and time. No distress.  Overweight  HENT:  Head: Atraumatic.  Partial deafness. Uses hearing aids.  Eyes:  Wears prescription lenses.  Neck: No JVD present. No tracheal deviation present. No thyromegaly present.  Some tenderness with movement laterally to either side.  Cardiovascular: Normal rate and regular rhythm.  Exam reveals no friction rub.   Murmur (3/6 SEM at LSB and aortic ejection) heard. Bilateral varicose veins  Pulmonary/Chest: No respiratory distress. She has no wheezes. She has no rales. She exhibits tenderness (left lower chest wall).  Tender left flank area lower rib margins laterally and slightly anteriorly. Voice is slightly hoarse.  Abdominal: Soft. Bowel sounds are normal. She exhibits no distension and no mass. There is no tenderness.  Genitourinary: Guaiac negative stool.  Anal fissure at 6 o'clock  Musculoskeletal: Normal range of motion. She exhibits no edema or tenderness.  Tender the greater trochanter and right groin. She is hobbled by this decrease in her stride length.  Lymphadenopathy:    She has no cervical adenopathy.  Neurological: She is alert and oriented to person, place, and time. No cranial nerve deficit. She exhibits normal muscle tone. Coordination normal.  04/30/12 MMSE 27/30. Passed clock drawing. Having some trouble in sequencing events and in telling a coherent story.   Insensitive to vibration in the feet.  Skin: No rash noted. She is not diaphoretic. No erythema. No pallor.  Psychiatric: She has a normal mood and affect. Her behavior is normal. Judgment and thought content normal.     Labs reviewed: No visits with results within 3 Month(s) from this visit. Latest known visit with results is:  Admission on 02/27/2014, Discharged on 03/01/2014  Component Date Value Ref Range Status  . WBC 02/27/2014 13.9* 4.0 - 10.5 K/uL Final  . RBC 02/27/2014 3.71* 3.87 - 5.11 MIL/uL Final  . Hemoglobin 02/27/2014 11.1* 12.0 - 15.0 g/dL Final  . HCT 02/27/2014 33.9* 36.0 - 46.0 % Final  . MCV 02/27/2014 91.4  78.0 - 100.0 fL Final  .  Central 02/27/2014 29.9  26.0 - 34.0 pg Final  . MCHC 02/27/2014 32.7  30.0 - 36.0 g/dL Final  . RDW 02/27/2014 13.9  11.5 - 15.5 % Final  . Platelets 02/27/2014 198  150 - 400 K/uL Final  . Neutrophils Relative % 02/27/2014 75  43 - 77 % Final  . Neutro Abs 02/27/2014 10.5* 1.7 - 7.7 K/uL Final  . Lymphocytes Relative 02/27/2014 12  12 - 46 % Final  . Lymphs Abs 02/27/2014 1.6  0.7 - 4.0 K/uL Final  . Monocytes Relative 02/27/2014 12  3 - 12 % Final  . Monocytes Absolute 02/27/2014 1.7* 0.1 - 1.0 K/uL Final  . Eosinophils Relative 02/27/2014 1  0 - 5 % Final  . Eosinophils Absolute 02/27/2014 0.1  0.0 - 0.7 K/uL Final  . Basophils Relative 02/27/2014 0  0 - 1 % Final  . Basophils Absolute 02/27/2014 0.0  0.0 - 0.1 K/uL Final  . Sodium 02/27/2014 134* 135 - 145 mmol/L Final   Please note change in reference range.  . Potassium 02/27/2014 3.1* 3.5 - 5.1 mmol/L Final   Please note change in reference range.  . Chloride 02/27/2014 99  96 - 112 mEq/L Final  . CO2 02/27/2014 22  19 - 32 mmol/L Final  . Glucose, Bld 02/27/2014 137* 70 - 99 mg/dL Final  . BUN 02/27/2014 61* 6 - 23 mg/dL Final  . Creatinine, Ser 02/27/2014 1.99* 0.50 - 1.10 mg/dL Final  . Calcium 02/27/2014 8.5  8.4 - 10.5 mg/dL Final  . Total Protein 02/27/2014 5.4*  6.0 - 8.3 g/dL Final  . Albumin 02/27/2014 3.0* 3.5 - 5.2 g/dL Final  . AST 02/27/2014 23  0 - 37 U/L Final  . ALT 02/27/2014 20  0 - 35 U/L Final  . Alkaline Phosphatase 02/27/2014 72  39 - 117 U/L Final  . Total Bilirubin 02/27/2014 0.7  0.3 - 1.2 mg/dL Final  . GFR calc non Af Amer 02/27/2014 21* >90 mL/min Final  . GFR calc Af Amer 02/27/2014 24* >90 mL/min Final   Comment: (NOTE) The eGFR has been calculated using the CKD EPI equation. This calculation has not been validated in all clinical situations. eGFR's persistently <90 mL/min signify possible Chronic Kidney Disease.   . Anion gap 02/27/2014 13  5 - 15 Final  . Troponin I 02/27/2014 <0.03  <0.031 ng/mL Final   Comment:        NO INDICATION OF MYOCARDIAL INJURY. Please note change in reference range.   . Prothrombin Time 02/27/2014 16.5* 11.6 - 15.2 seconds Final  . INR 02/27/2014 1.31  0.00 - 1.49 Final  . D-Dimer, Quant 02/27/2014 >20.00* 0.00 - 0.48 ug/mL-FEU Final   Comment:        AT THE INHOUSE ESTABLISHED CUTOFF VALUE OF 0.48 ug/mL FEU, THIS ASSAY HAS BEEN DOCUMENTED IN THE LITERATURE TO HAVE A SENSITIVITY AND NEGATIVE PREDICTIVE VALUE OF AT LEAST 98 TO 99%.  THE TEST RESULT SHOULD BE CORRELATED WITH AN ASSESSMENT OF THE CLINICAL PROBABILITY OF DVT / VTE.   Marland Kitchen Troponin I 02/27/2014 0.06* <0.031 ng/mL Final   Comment:        PERSISTENTLY INCREASED TROPONIN VALUES IN THE RANGE OF 0.04-0.49 ng/mL CAN BE SEEN IN:       -UNSTABLE ANGINA       -CONGESTIVE HEART FAILURE       -MYOCARDITIS       -CHEST TRAUMA       -ARRYHTHMIAS       -  LATE PRESENTING MYOCARDIAL INFARCTION       -COPD   CLINICAL FOLLOW-UP RECOMMENDED. Please note change in reference range.   . Troponin I 02/28/2014 0.03  <0.031 ng/mL Final   Comment:        NO INDICATION OF MYOCARDIAL INJURY. Please note change in reference range.   . Heparin Unfractionated 02/28/2014 0.48  0.30 - 0.70 IU/mL Final   Comment:        IF HEPARIN RESULTS  ARE BELOW EXPECTED VALUES, AND PATIENT DOSAGE HAS BEEN CONFIRMED, SUGGEST FOLLOW UP TESTING OF ANTITHROMBIN III LEVELS.   Marland Kitchen Sodium 02/28/2014 135  135 - 145 mmol/L Final   Please note change in reference range.  . Potassium 02/28/2014 4.1  3.5 - 5.1 mmol/L Final   Comment: Please note change in reference range. DELTA CHECK NOTED   . Chloride 02/28/2014 102  96 - 112 mEq/L Final  . CO2 02/28/2014 25  19 - 32 mmol/L Final  . Glucose, Bld 02/28/2014 98  70 - 99 mg/dL Final  . BUN 02/28/2014 56* 6 - 23 mg/dL Final  . Creatinine, Ser 02/28/2014 1.65* 0.50 - 1.10 mg/dL Final  . Calcium 02/28/2014 8.7  8.4 - 10.5 mg/dL Final  . GFR calc non Af Amer 02/28/2014 26* >90 mL/min Final  . GFR calc Af Amer 02/28/2014 30* >90 mL/min Final   Comment: (NOTE) The eGFR has been calculated using the CKD EPI equation. This calculation has not been validated in all clinical situations. eGFR's persistently <90 mL/min signify possible Chronic Kidney Disease.   . Anion gap 02/28/2014 8  5 - 15 Final  . WBC 02/28/2014 11.1* 4.0 - 10.5 K/uL Final  . RBC 02/28/2014 3.73* 3.87 - 5.11 MIL/uL Final  . Hemoglobin 02/28/2014 11.1* 12.0 - 15.0 g/dL Final  . HCT 02/28/2014 34.6* 36.0 - 46.0 % Final  . MCV 02/28/2014 92.8  78.0 - 100.0 fL Final  . MCH 02/28/2014 29.8  26.0 - 34.0 pg Final  . MCHC 02/28/2014 32.1  30.0 - 36.0 g/dL Final  . RDW 02/28/2014 14.1  11.5 - 15.5 % Final  . Platelets 02/28/2014 184  150 - 400 K/uL Final  . Prothrombin Time 02/28/2014 16.3* 11.6 - 15.2 seconds Final  . INR 02/28/2014 1.29  0.00 - 1.49 Final  . Troponin I 02/28/2014 0.03  <0.031 ng/mL Final   Comment:        NO INDICATION OF MYOCARDIAL INJURY. Please note change in reference range.   . Heparin Unfractionated 02/28/2014 0.34  0.30 - 0.70 IU/mL Final   Comment:        IF HEPARIN RESULTS ARE BELOW EXPECTED VALUES, AND PATIENT DOSAGE HAS BEEN CONFIRMED, SUGGEST FOLLOW UP TESTING OF ANTITHROMBIN III LEVELS.   .  Color, Urine 02/28/2014 YELLOW  YELLOW Final  . APPearance 02/28/2014 CLEAR  CLEAR Final  . Specific Gravity, Urine 02/28/2014 1.015  1.005 - 1.030 Final  . pH 02/28/2014 5.5  5.0 - 8.0 Final  . Glucose, UA 02/28/2014 NEGATIVE  NEGATIVE mg/dL Final  . Hgb urine dipstick 02/28/2014 NEGATIVE  NEGATIVE Final  . Bilirubin Urine 02/28/2014 NEGATIVE  NEGATIVE Final  . Ketones, ur 02/28/2014 NEGATIVE  NEGATIVE mg/dL Final  . Protein, ur 02/28/2014 NEGATIVE  NEGATIVE mg/dL Final  . Urobilinogen, UA 02/28/2014 0.2  0.0 - 1.0 mg/dL Final  . Nitrite 02/28/2014 NEGATIVE  NEGATIVE Final  . Leukocytes, UA 02/28/2014 NEGATIVE  NEGATIVE Final   MICROSCOPIC NOT DONE ON URINES WITH NEGATIVE PROTEIN, BLOOD, LEUKOCYTES, NITRITE,  OR GLUCOSE <1000 mg/dL.  . WBC 03/01/2014 13.2* 4.0 - 10.5 K/uL Final  . RBC 03/01/2014 3.63* 3.87 - 5.11 MIL/uL Final  . Hemoglobin 03/01/2014 10.8* 12.0 - 15.0 g/dL Final  . HCT 03/01/2014 33.9* 36.0 - 46.0 % Final  . MCV 03/01/2014 93.4  78.0 - 100.0 fL Final  . MCH 03/01/2014 29.8  26.0 - 34.0 pg Final  . MCHC 03/01/2014 31.9  30.0 - 36.0 g/dL Final  . RDW 03/01/2014 14.0  11.5 - 15.5 % Final  . Platelets 03/01/2014 227  150 - 400 K/uL Final  . Sodium 03/01/2014 136  135 - 145 mmol/L Final   Please note change in reference range.  . Potassium 03/01/2014 4.7  3.5 - 5.1 mmol/L Final   Please note change in reference range.  . Chloride 03/01/2014 105  96 - 112 mEq/L Final  . CO2 03/01/2014 24  19 - 32 mmol/L Final  . Glucose, Bld 03/01/2014 103* 70 - 99 mg/dL Final  . BUN 03/01/2014 28* 6 - 23 mg/dL Final   DELTA CHECK NOTED  . Creatinine, Ser 03/01/2014 1.03  0.50 - 1.10 mg/dL Final   DELTA CHECK NOTED  . Calcium 03/01/2014 8.8  8.4 - 10.5 mg/dL Final  . GFR calc non Af Amer 03/01/2014 46* >90 mL/min Final  . GFR calc Af Amer 03/01/2014 54* >90 mL/min Final   Comment: (NOTE) The eGFR has been calculated using the CKD EPI equation. This calculation has not been validated  in all clinical situations. eGFR's persistently <90 mL/min signify possible Chronic Kidney Disease.   . Anion gap 03/01/2014 7  5 - 15 Final  . Color, Urine 03/01/2014 YELLOW  YELLOW Final  . APPearance 03/01/2014 CLEAR  CLEAR Final  . Specific Gravity, Urine 03/01/2014 1.015  1.005 - 1.030 Final  . pH 03/01/2014 6.5  5.0 - 8.0 Final  . Glucose, UA 03/01/2014 NEGATIVE  NEGATIVE mg/dL Final  . Hgb urine dipstick 03/01/2014 NEGATIVE  NEGATIVE Final  . Bilirubin Urine 03/01/2014 NEGATIVE  NEGATIVE Final  . Ketones, ur 03/01/2014 NEGATIVE  NEGATIVE mg/dL Final  . Protein, ur 03/01/2014 NEGATIVE  NEGATIVE mg/dL Final  . Urobilinogen, UA 03/01/2014 1.0  0.0 - 1.0 mg/dL Final  . Nitrite 03/01/2014 NEGATIVE  NEGATIVE Final  . Leukocytes, UA 03/01/2014 NEGATIVE  NEGATIVE Final   MICROSCOPIC NOT DONE ON URINES WITH NEGATIVE PROTEIN, BLOOD, LEUKOCYTES, NITRITE, OR GLUCOSE <1000 mg/dL.     Assessment/Plan 1. Compression fracture of L2, with routine healing, subsequent encounter - Ambulatory referral to Orthopedic Surgery  2. Obsessive compulsive disorder - fluvoxaMINE (LUVOX) 50 MG tablet; One at bed to help nerves  Dispense: 30 tablet; Refill: 5  3. Right hip pain Refer to orthopedist  4. LUQ abdominal pain Chronic and unchanged  5. Left-sided low back pain with left-sided sciatica Refer to orthopedist  6. Anxiety state - fluvoxaMINE (LUVOX) 50 MG tablet; One at bed to help nerves  Dispense: 30 tablet; Refill: 5  7. Essential hypertension Adequately controlled  8. Abnormality of gait - Ambulatory referral to Orthopedic Surgery

## 2014-07-29 DIAGNOSIS — R260 Ataxic gait: Secondary | ICD-10-CM | POA: Diagnosis not present

## 2014-07-29 DIAGNOSIS — M6281 Muscle weakness (generalized): Secondary | ICD-10-CM | POA: Diagnosis not present

## 2014-07-29 DIAGNOSIS — S32020A Wedge compression fracture of second lumbar vertebra, initial encounter for closed fracture: Secondary | ICD-10-CM | POA: Diagnosis not present

## 2014-07-29 DIAGNOSIS — R41841 Cognitive communication deficit: Secondary | ICD-10-CM | POA: Diagnosis not present

## 2014-07-30 DIAGNOSIS — R41841 Cognitive communication deficit: Secondary | ICD-10-CM | POA: Diagnosis not present

## 2014-07-30 DIAGNOSIS — S32020A Wedge compression fracture of second lumbar vertebra, initial encounter for closed fracture: Secondary | ICD-10-CM | POA: Diagnosis not present

## 2014-07-30 DIAGNOSIS — R260 Ataxic gait: Secondary | ICD-10-CM | POA: Diagnosis not present

## 2014-07-30 DIAGNOSIS — M6281 Muscle weakness (generalized): Secondary | ICD-10-CM | POA: Diagnosis not present

## 2014-07-31 DIAGNOSIS — R41841 Cognitive communication deficit: Secondary | ICD-10-CM | POA: Diagnosis not present

## 2014-07-31 DIAGNOSIS — S32020A Wedge compression fracture of second lumbar vertebra, initial encounter for closed fracture: Secondary | ICD-10-CM | POA: Diagnosis not present

## 2014-07-31 DIAGNOSIS — R260 Ataxic gait: Secondary | ICD-10-CM | POA: Diagnosis not present

## 2014-07-31 DIAGNOSIS — M6281 Muscle weakness (generalized): Secondary | ICD-10-CM | POA: Diagnosis not present

## 2014-08-04 DIAGNOSIS — R41841 Cognitive communication deficit: Secondary | ICD-10-CM | POA: Diagnosis not present

## 2014-08-04 DIAGNOSIS — R260 Ataxic gait: Secondary | ICD-10-CM | POA: Diagnosis not present

## 2014-08-04 DIAGNOSIS — S32020A Wedge compression fracture of second lumbar vertebra, initial encounter for closed fracture: Secondary | ICD-10-CM | POA: Diagnosis not present

## 2014-08-04 DIAGNOSIS — M6281 Muscle weakness (generalized): Secondary | ICD-10-CM | POA: Diagnosis not present

## 2014-08-05 DIAGNOSIS — S32020A Wedge compression fracture of second lumbar vertebra, initial encounter for closed fracture: Secondary | ICD-10-CM | POA: Diagnosis not present

## 2014-08-05 DIAGNOSIS — R41841 Cognitive communication deficit: Secondary | ICD-10-CM | POA: Diagnosis not present

## 2014-08-05 DIAGNOSIS — M6281 Muscle weakness (generalized): Secondary | ICD-10-CM | POA: Diagnosis not present

## 2014-08-05 DIAGNOSIS — R260 Ataxic gait: Secondary | ICD-10-CM | POA: Diagnosis not present

## 2014-08-10 ENCOUNTER — Other Ambulatory Visit: Payer: Self-pay | Admitting: *Deleted

## 2014-08-10 ENCOUNTER — Telehealth: Payer: Self-pay | Admitting: *Deleted

## 2014-08-10 MED ORDER — AMPHETAMINE-DEXTROAMPHETAMINE 5 MG PO TABS: ORAL_TABLET | ORAL | Status: DC

## 2014-08-10 NOTE — Telephone Encounter (Signed)
[facility] Pending Telephone message to Dr. Chilton SiGreen regarding if patient should still be taking Adderall or not. Holding Rx until response.

## 2014-08-10 NOTE — Telephone Encounter (Signed)
Adderall was discontinued. She should not receive further refills.

## 2014-08-10 NOTE — Telephone Encounter (Signed)
Adderall was taken out of patient's medication list at last appointment on 07/28/2014. Not sure if this is an error or not. Received fax refill from [facility]. Is patient suppose to continue taking Adderall? Please Advise. (I placed back in medication list but question it after review OV note dated 07/28/14)

## 2014-08-10 NOTE — Telephone Encounter (Signed)
Message sent to [facility] that medication was discontinued. Taken out of medication list.

## 2014-08-27 DIAGNOSIS — M4806 Spinal stenosis, lumbar region: Secondary | ICD-10-CM | POA: Diagnosis not present

## 2014-08-31 ENCOUNTER — Other Ambulatory Visit: Payer: Self-pay

## 2014-09-08 ENCOUNTER — Encounter: Payer: Self-pay | Admitting: Internal Medicine

## 2014-09-08 DIAGNOSIS — R262 Difficulty in walking, not elsewhere classified: Secondary | ICD-10-CM | POA: Diagnosis not present

## 2014-09-08 DIAGNOSIS — M6281 Muscle weakness (generalized): Secondary | ICD-10-CM | POA: Diagnosis not present

## 2014-09-08 DIAGNOSIS — R260 Ataxic gait: Secondary | ICD-10-CM | POA: Diagnosis not present

## 2014-09-08 DIAGNOSIS — R41841 Cognitive communication deficit: Secondary | ICD-10-CM | POA: Diagnosis not present

## 2014-09-08 DIAGNOSIS — Z9181 History of falling: Secondary | ICD-10-CM | POA: Diagnosis not present

## 2014-09-08 DIAGNOSIS — R278 Other lack of coordination: Secondary | ICD-10-CM | POA: Diagnosis not present

## 2014-09-09 ENCOUNTER — Other Ambulatory Visit: Payer: Self-pay | Admitting: *Deleted

## 2014-09-09 DIAGNOSIS — R41841 Cognitive communication deficit: Secondary | ICD-10-CM | POA: Diagnosis not present

## 2014-09-09 DIAGNOSIS — R278 Other lack of coordination: Secondary | ICD-10-CM | POA: Diagnosis not present

## 2014-09-09 DIAGNOSIS — R260 Ataxic gait: Secondary | ICD-10-CM | POA: Diagnosis not present

## 2014-09-09 DIAGNOSIS — M6281 Muscle weakness (generalized): Secondary | ICD-10-CM | POA: Diagnosis not present

## 2014-09-09 DIAGNOSIS — E118 Type 2 diabetes mellitus with unspecified complications: Secondary | ICD-10-CM

## 2014-09-09 DIAGNOSIS — R262 Difficulty in walking, not elsewhere classified: Secondary | ICD-10-CM | POA: Diagnosis not present

## 2014-09-09 DIAGNOSIS — I10 Essential (primary) hypertension: Secondary | ICD-10-CM

## 2014-09-09 DIAGNOSIS — Z9181 History of falling: Secondary | ICD-10-CM | POA: Diagnosis not present

## 2014-09-10 DIAGNOSIS — R41841 Cognitive communication deficit: Secondary | ICD-10-CM | POA: Diagnosis not present

## 2014-09-10 DIAGNOSIS — R262 Difficulty in walking, not elsewhere classified: Secondary | ICD-10-CM | POA: Diagnosis not present

## 2014-09-10 DIAGNOSIS — R260 Ataxic gait: Secondary | ICD-10-CM | POA: Diagnosis not present

## 2014-09-10 DIAGNOSIS — M6281 Muscle weakness (generalized): Secondary | ICD-10-CM | POA: Diagnosis not present

## 2014-09-10 DIAGNOSIS — R278 Other lack of coordination: Secondary | ICD-10-CM | POA: Diagnosis not present

## 2014-09-10 DIAGNOSIS — Z9181 History of falling: Secondary | ICD-10-CM | POA: Diagnosis not present

## 2014-09-11 DIAGNOSIS — R262 Difficulty in walking, not elsewhere classified: Secondary | ICD-10-CM | POA: Diagnosis not present

## 2014-09-11 DIAGNOSIS — R41841 Cognitive communication deficit: Secondary | ICD-10-CM | POA: Diagnosis not present

## 2014-09-11 DIAGNOSIS — Z9181 History of falling: Secondary | ICD-10-CM | POA: Diagnosis not present

## 2014-09-11 DIAGNOSIS — M6281 Muscle weakness (generalized): Secondary | ICD-10-CM | POA: Diagnosis not present

## 2014-09-11 DIAGNOSIS — R260 Ataxic gait: Secondary | ICD-10-CM | POA: Diagnosis not present

## 2014-09-11 DIAGNOSIS — R278 Other lack of coordination: Secondary | ICD-10-CM | POA: Diagnosis not present

## 2014-09-14 ENCOUNTER — Other Ambulatory Visit: Payer: Medicare Other

## 2014-09-14 DIAGNOSIS — Z9181 History of falling: Secondary | ICD-10-CM | POA: Diagnosis not present

## 2014-09-14 DIAGNOSIS — R260 Ataxic gait: Secondary | ICD-10-CM | POA: Diagnosis not present

## 2014-09-14 DIAGNOSIS — R41841 Cognitive communication deficit: Secondary | ICD-10-CM | POA: Diagnosis not present

## 2014-09-14 DIAGNOSIS — R278 Other lack of coordination: Secondary | ICD-10-CM | POA: Diagnosis not present

## 2014-09-14 DIAGNOSIS — R262 Difficulty in walking, not elsewhere classified: Secondary | ICD-10-CM | POA: Diagnosis not present

## 2014-09-14 DIAGNOSIS — M6281 Muscle weakness (generalized): Secondary | ICD-10-CM | POA: Diagnosis not present

## 2014-09-15 ENCOUNTER — Ambulatory Visit (INDEPENDENT_AMBULATORY_CARE_PROVIDER_SITE_OTHER): Payer: Medicare Other | Admitting: Internal Medicine

## 2014-09-15 ENCOUNTER — Encounter: Payer: Self-pay | Admitting: Internal Medicine

## 2014-09-15 VITALS — BP 124/70 | HR 70 | Temp 98.0°F | Ht 63.0 in | Wt 164.0 lb

## 2014-09-15 DIAGNOSIS — N182 Chronic kidney disease, stage 2 (mild): Secondary | ICD-10-CM

## 2014-09-15 DIAGNOSIS — E785 Hyperlipidemia, unspecified: Secondary | ICD-10-CM | POA: Diagnosis not present

## 2014-09-15 DIAGNOSIS — Z86711 Personal history of pulmonary embolism: Secondary | ICD-10-CM | POA: Diagnosis not present

## 2014-09-15 DIAGNOSIS — E119 Type 2 diabetes mellitus without complications: Secondary | ICD-10-CM | POA: Diagnosis not present

## 2014-09-15 DIAGNOSIS — M5442 Lumbago with sciatica, left side: Secondary | ICD-10-CM

## 2014-09-15 DIAGNOSIS — I10 Essential (primary) hypertension: Secondary | ICD-10-CM

## 2014-09-15 DIAGNOSIS — Z86718 Personal history of other venous thrombosis and embolism: Secondary | ICD-10-CM | POA: Diagnosis not present

## 2014-09-15 DIAGNOSIS — R1314 Dysphagia, pharyngoesophageal phase: Secondary | ICD-10-CM

## 2014-09-15 DIAGNOSIS — E1121 Type 2 diabetes mellitus with diabetic nephropathy: Secondary | ICD-10-CM | POA: Diagnosis not present

## 2014-09-15 DIAGNOSIS — E1129 Type 2 diabetes mellitus with other diabetic kidney complication: Secondary | ICD-10-CM

## 2014-09-15 NOTE — Progress Notes (Signed)
Patient ID: Christine Pierce, female   DOB: [birth year], 81 y.o.   MRN: [id number]    Facility  [facility]    Place of Service:   Office    Allergies  Allergen Reactions  . Macrodantin [Nitrofurantoin Macrocrystal]     Affects kidneys  . Proventil [Albuterol]     Patient unable to coordinate use of inhaler    Chief Complaint  Patient presents with  . Medical Management of Chronic Issues    3 month follow-up, no recent labs (not fasting today)  . Pain    Multiple areas with pain - left side, back (L2 area), both legs (right is worse), and left arm   . Medication Management    Discuss restarting adderall     HPI:  At the end of the patient's last visit, we recommended several medications be discontinued in order to simplify her drug therapies. These medicines include cholecystectomy, Mevacor, Delsym, Adderall, and a multivitamin. None of these medications were discontinued. She continues to take all of them at this time.  Since her last visit, patient has been to see Dr. bleeding in regards to her back pains. Dr. Bernadette Hoit note indicates the patient continues to be in some pain at a level of 4-6/10. She was getting physical therapy, bracing, and the use of walker. Patient confirms this. He has noticed further indicates the presence of lumbar scoliosis, multilevel disc degeneration, history of a compression fracture, and spinal stenosis. His recommendations were to take Tylenol twice daily and then again at night to take Tylenol arthritis strength. If this is not successful then she has an option of getting an epidural steroid series. She would have to be off Xarelto for that and he notes that she could be sent right to DR. He felt that she has a nonsurgical candidate.  Patient additionally complains today of left arm discomfort for the last 2 months it sometimes wakes her at night.  Patient is due to get hemoglobin A1c and microalbuminuria test.  Habits would has been concerned about swelling in  both of her ankles. She has gained 13 pounds in the last 3 months, but had lost a lot of weight prior to that wait 3 months ago. She is denying shortness of breath.  Medications: Patient's Medications  New Prescriptions   No medications on file  Previous Medications   ACETAMINOPHEN (TYLENOL) 500 MG TABLET    1000 mg twice daily, 1 by mouth every 4 hours as needed for pain   AMLODIPINE (NORVASC) 10 MG TABLET    Take 1 tablet (10 mg total) by mouth daily.   CALCITONIN, SALMON, (MIACALCIN/FORTICAL) 200 UNIT/ACT NASAL SPRAY    Place 1 spray into alternate nostrils every other day.    FLUVOXAMINE (LUVOX) 50 MG TABLET    One at bed to help nerves   LOSARTAN (COZAAR) 50 MG TABLET    Take 1 tablet (50 mg total) by mouth daily.   LOVASTATIN (MEVACOR) 40 MG TABLET    Take 40 mg by mouth at bedtime.   MELATONIN 5 MG CAPS    Take 1 capsule by mouth at bedtime as needed.   MIRABEGRON ER (MYRBETRIQ) 25 MG TB24 TABLET    One daily to help bladder control   MISC. DEVICES (ROLLER WALKER) MISC    Rolling Walker with seat and brakes.   MULTIPLE VITAMINS-MINERALS (SUPER THERA VITE M PO)    Take 1 tablet by mouth daily.   NITROGLYCERIN (NITROSTAT) 0.4 MG SL TABLET  Place 0.4 mg under the tongue every 5 (five) minutes as needed for chest pain.   OMEPRAZOLE (PRILOSEC) 40 MG CAPSULE    Take 40 mg by mouth 2 (two) times daily.   PHENOL (CHLORASEPTIC) 1.4 % LIQD    Use as directed 1 spray in the mouth or throat as needed for throat irritation / pain.   POLYETHYLENE GLYCOL (MIRALAX / GLYCOLAX) PACKET    Take 17 g by mouth daily as needed for mild constipation.   RIVAROXABAN (XARELTO) 20 MG TABS TABLET    Take 1 tablet (20 mg total) by mouth daily with supper.   SENNA-DOCUSATE (SENOKOT-S) 8.6-50 MG PER TABLET    One at bed to prevent constipation  Modified Medications   No medications on file  Discontinued Medications   No medications on file     Review of Systems  Constitutional: Positive for fatigue. Negative  for fever, chills, diaphoresis, activity change, appetite change and unexpected weight change.  HENT: Positive for hearing loss.   Eyes: Negative.   Respiratory: Positive for cough and shortness of breath.        Complains of a hoarse voice for the last few months. There is no pain.  Cardiovascular: Positive for leg swelling. Negative for palpitations.  Gastrointestinal: Positive for constipation and rectal pain.       Occasional fecal incontinence. Sometimes has difficulty swallowing. Solids and liquids seem to be involved. There is no odynophagia.  Endocrine:       Elevated blood sugars. Currently on "dietary control". She is not following her diet closely.  Genitourinary: Positive for urgency and frequency. Negative for dysuria, decreased urine volume, vaginal bleeding, vaginal discharge, enuresis, genital sores, vaginal pain and pelvic pain.       UTI 02/17/14. Incontinent of urine.  Musculoskeletal:       Generalized weakness: Pain in the right hip area. Requires assistance in getting up and down. At risk for falls.  Skin: Negative.   Allergic/Immunologic: Negative.   Neurological: Positive for weakness.       Memory loss.  Hematological: Negative.   Psychiatric/Behavioral: Positive for confusion. The patient is nervous/anxious.        History of hoarding behavior.Past history of [employer] buying of products on television.    Filed Vitals:   09/15/14 1344  BP: 124/70  Pulse: 70  Temp: 98 F (36.7 C)  TempSrc: Oral  Height: '5\' 3"'  (1.6 m)  Weight: 164 lb (74.39 kg)  SpO2: 94%   Body mass index is 29.06 kg/(m^2).  Physical Exam  Constitutional: She is oriented to person, place, and time. No distress.  Overweight  HENT:  Head: Atraumatic.  Partial deafness. Uses hearing aids.  Eyes:  Wears prescription lenses.  Neck: No JVD present. No tracheal deviation present. No thyromegaly present.  Some tenderness with movement laterally to either side.  Cardiovascular: Normal  rate and regular rhythm.  Exam reveals no friction rub.   Murmur (3/6 SEM at LSB and aortic ejection) heard. Bilateral varicose veins  Pulmonary/Chest: No respiratory distress. She has no wheezes. She has no rales. She exhibits tenderness (left lower chest wall).  Tender left flank area lower rib margins laterally and slightly anteriorly. Voice is slightly hoarse.  Abdominal: Soft. Bowel sounds are normal. She exhibits no distension and no mass. There is no tenderness.  Genitourinary: Guaiac negative stool.  Anal fissure at 6 o'clock  Musculoskeletal: Normal range of motion. She exhibits no edema or tenderness.  Tender the greater trochanter and right  groin. She is hobbled by this decrease in her stride length.  Lymphadenopathy:    She has no cervical adenopathy.  Neurological: She is alert and oriented to person, place, and time. No cranial nerve deficit. She exhibits normal muscle tone. Coordination normal.  04/30/12 MMSE 27/30. Passed clock drawing. Having some trouble in sequencing events and in telling a coherent story.  Insensitive to vibration in the feet.  Skin: No rash noted. She is not diaphoretic. No erythema. No pallor.  Psychiatric: She has a normal mood and affect. Her behavior is normal. Judgment and thought content normal.     Labs reviewed: No visits with results within 3 Month(s) from this visit. Latest known visit with results is:  Admission on 02/27/2014, Discharged on 03/01/2014  Component Date Value Ref Range Status  . WBC 02/27/2014 13.9* 4.0 - 10.5 K/uL Final  . RBC 02/27/2014 3.71* 3.87 - 5.11 MIL/uL Final  . Hemoglobin 02/27/2014 11.1* 12.0 - 15.0 g/dL Final  . HCT 02/27/2014 33.9* 36.0 - 46.0 % Final  . MCV 02/27/2014 91.4  78.0 - 100.0 fL Final  . MCH 02/27/2014 29.9  26.0 - 34.0 pg Final  . MCHC 02/27/2014 32.7  30.0 - 36.0 g/dL Final  . RDW 02/27/2014 13.9  11.5 - 15.5 % Final  . Platelets 02/27/2014 198  150 - 400 K/uL Final  . Neutrophils Relative %  02/27/2014 75  43 - 77 % Final  . Neutro Abs 02/27/2014 10.5* 1.7 - 7.7 K/uL Final  . Lymphocytes Relative 02/27/2014 12  12 - 46 % Final  . Lymphs Abs 02/27/2014 1.6  0.7 - 4.0 K/uL Final  . Monocytes Relative 02/27/2014 12  3 - 12 % Final  . Monocytes Absolute 02/27/2014 1.7* 0.1 - 1.0 K/uL Final  . Eosinophils Relative 02/27/2014 1  0 - 5 % Final  . Eosinophils Absolute 02/27/2014 0.1  0.0 - 0.7 K/uL Final  . Basophils Relative 02/27/2014 0  0 - 1 % Final  . Basophils Absolute 02/27/2014 0.0  0.0 - 0.1 K/uL Final  . Sodium 02/27/2014 134* 135 - 145 mmol/L Final   Please note change in reference range.  . Potassium 02/27/2014 3.1* 3.5 - 5.1 mmol/L Final   Please note change in reference range.  . Chloride 02/27/2014 99  96 - 112 mEq/L Final  . CO2 02/27/2014 22  19 - 32 mmol/L Final  . Glucose, Bld 02/27/2014 137* 70 - 99 mg/dL Final  . BUN 02/27/2014 61* 6 - 23 mg/dL Final  . Creatinine, Ser 02/27/2014 1.99* 0.50 - 1.10 mg/dL Final  . Calcium 02/27/2014 8.5  8.4 - 10.5 mg/dL Final  . Total Protein 02/27/2014 5.4* 6.0 - 8.3 g/dL Final  . Albumin 02/27/2014 3.0* 3.5 - 5.2 g/dL Final  . AST 02/27/2014 23  0 - 37 U/L Final  . ALT 02/27/2014 20  0 - 35 U/L Final  . Alkaline Phosphatase 02/27/2014 72  39 - 117 U/L Final  . Total Bilirubin 02/27/2014 0.7  0.3 - 1.2 mg/dL Final  . GFR calc non Af Amer 02/27/2014 21* >90 mL/min Final  . GFR calc Af Amer 02/27/2014 24* >90 mL/min Final   Comment: (NOTE) The eGFR has been calculated using the CKD EPI equation. This calculation has not been validated in all clinical situations. eGFR's persistently <90 mL/min signify possible Chronic Kidney Disease.   . Anion gap 02/27/2014 13  5 - 15 Final  . Troponin I 02/27/2014 <0.03  <0.031 ng/mL Final  Comment:        NO INDICATION OF MYOCARDIAL INJURY. Please note change in reference range.   . Prothrombin Time 02/27/2014 16.5* 11.6 - 15.2 seconds Final  . INR 02/27/2014 1.31  0.00 - 1.49  Final  . D-Dimer, Quant 02/27/2014 >20.00* 0.00 - 0.48 ug/mL-FEU Final   Comment:        AT THE INHOUSE ESTABLISHED CUTOFF VALUE OF 0.48 ug/mL FEU, THIS ASSAY HAS BEEN DOCUMENTED IN THE LITERATURE TO HAVE A SENSITIVITY AND NEGATIVE PREDICTIVE VALUE OF AT LEAST 98 TO 99%.  THE TEST RESULT SHOULD BE CORRELATED WITH AN ASSESSMENT OF THE CLINICAL PROBABILITY OF DVT / VTE.   Marland Kitchen Troponin I 02/27/2014 0.06* <0.031 ng/mL Final   Comment:        PERSISTENTLY INCREASED TROPONIN VALUES IN THE RANGE OF 0.04-0.49 ng/mL CAN BE SEEN IN:       -UNSTABLE ANGINA       -CONGESTIVE HEART FAILURE       -MYOCARDITIS       -CHEST TRAUMA       -ARRYHTHMIAS       -LATE PRESENTING MYOCARDIAL INFARCTION       -COPD   CLINICAL FOLLOW-UP RECOMMENDED. Please note change in reference range.   . Troponin I 02/28/2014 0.03  <0.031 ng/mL Final   Comment:        NO INDICATION OF MYOCARDIAL INJURY. Please note change in reference range.   . Heparin Unfractionated 02/28/2014 0.48  0.30 - 0.70 IU/mL Final   Comment:        IF HEPARIN RESULTS ARE BELOW EXPECTED VALUES, AND PATIENT DOSAGE HAS BEEN CONFIRMED, SUGGEST FOLLOW UP TESTING OF ANTITHROMBIN III LEVELS.   Marland Kitchen Sodium 02/28/2014 135  135 - 145 mmol/L Final   Please note change in reference range.  . Potassium 02/28/2014 4.1  3.5 - 5.1 mmol/L Final   Comment: Please note change in reference range. DELTA CHECK NOTED   . Chloride 02/28/2014 102  96 - 112 mEq/L Final  . CO2 02/28/2014 25  19 - 32 mmol/L Final  . Glucose, Bld 02/28/2014 98  70 - 99 mg/dL Final  . BUN 02/28/2014 56* 6 - 23 mg/dL Final  . Creatinine, Ser 02/28/2014 1.65* 0.50 - 1.10 mg/dL Final  . Calcium 02/28/2014 8.7  8.4 - 10.5 mg/dL Final  . GFR calc non Af Amer 02/28/2014 26* >90 mL/min Final  . GFR calc Af Amer 02/28/2014 30* >90 mL/min Final   Comment: (NOTE) The eGFR has been calculated using the CKD EPI equation. This calculation has not been validated in all clinical  situations. eGFR's persistently <90 mL/min signify possible Chronic Kidney Disease.   . Anion gap 02/28/2014 8  5 - 15 Final  . WBC 02/28/2014 11.1* 4.0 - 10.5 K/uL Final  . RBC 02/28/2014 3.73* 3.87 - 5.11 MIL/uL Final  . Hemoglobin 02/28/2014 11.1* 12.0 - 15.0 g/dL Final  . HCT 02/28/2014 34.6* 36.0 - 46.0 % Final  . MCV 02/28/2014 92.8  78.0 - 100.0 fL Final  . MCH 02/28/2014 29.8  26.0 - 34.0 pg Final  . MCHC 02/28/2014 32.1  30.0 - 36.0 g/dL Final  . RDW 02/28/2014 14.1  11.5 - 15.5 % Final  . Platelets 02/28/2014 184  150 - 400 K/uL Final  . Prothrombin Time 02/28/2014 16.3* 11.6 - 15.2 seconds Final  . INR 02/28/2014 1.29  0.00 - 1.49 Final  . Troponin I 02/28/2014 0.03  <0.031 ng/mL Final   Comment:  NO INDICATION OF MYOCARDIAL INJURY. Please note change in reference range.   . Heparin Unfractionated 02/28/2014 0.34  0.30 - 0.70 IU/mL Final   Comment:        IF HEPARIN RESULTS ARE BELOW EXPECTED VALUES, AND PATIENT DOSAGE HAS BEEN CONFIRMED, SUGGEST FOLLOW UP TESTING OF ANTITHROMBIN III LEVELS.   . Color, Urine 02/28/2014 YELLOW  YELLOW Final  . APPearance 02/28/2014 CLEAR  CLEAR Final  . Specific Gravity, Urine 02/28/2014 1.015  1.005 - 1.030 Final  . pH 02/28/2014 5.5  5.0 - 8.0 Final  . Glucose, UA 02/28/2014 NEGATIVE  NEGATIVE mg/dL Final  . Hgb urine dipstick 02/28/2014 NEGATIVE  NEGATIVE Final  . Bilirubin Urine 02/28/2014 NEGATIVE  NEGATIVE Final  . Ketones, ur 02/28/2014 NEGATIVE  NEGATIVE mg/dL Final  . Protein, ur 02/28/2014 NEGATIVE  NEGATIVE mg/dL Final  . Urobilinogen, UA 02/28/2014 0.2  0.0 - 1.0 mg/dL Final  . Nitrite 02/28/2014 NEGATIVE  NEGATIVE Final  . Leukocytes, UA 02/28/2014 NEGATIVE  NEGATIVE Final   MICROSCOPIC NOT DONE ON URINES WITH NEGATIVE PROTEIN, BLOOD, LEUKOCYTES, NITRITE, OR GLUCOSE <1000 mg/dL.  . WBC 03/01/2014 13.2* 4.0 - 10.5 K/uL Final  . RBC 03/01/2014 3.63* 3.87 - 5.11 MIL/uL Final  . Hemoglobin 03/01/2014 10.8* 12.0 -  15.0 g/dL Final  . HCT 03/01/2014 33.9* 36.0 - 46.0 % Final  . MCV 03/01/2014 93.4  78.0 - 100.0 fL Final  . MCH 03/01/2014 29.8  26.0 - 34.0 pg Final  . MCHC 03/01/2014 31.9  30.0 - 36.0 g/dL Final  . RDW 03/01/2014 14.0  11.5 - 15.5 % Final  . Platelets 03/01/2014 227  150 - 400 K/uL Final  . Sodium 03/01/2014 136  135 - 145 mmol/L Final   Please note change in reference range.  . Potassium 03/01/2014 4.7  3.5 - 5.1 mmol/L Final   Please note change in reference range.  . Chloride 03/01/2014 105  96 - 112 mEq/L Final  . CO2 03/01/2014 24  19 - 32 mmol/L Final  . Glucose, Bld 03/01/2014 103* 70 - 99 mg/dL Final  . BUN 03/01/2014 28* 6 - 23 mg/dL Final   DELTA CHECK NOTED  . Creatinine, Ser 03/01/2014 1.03  0.50 - 1.10 mg/dL Final   DELTA CHECK NOTED  . Calcium 03/01/2014 8.8  8.4 - 10.5 mg/dL Final  . GFR calc non Af Amer 03/01/2014 46* >90 mL/min Final  . GFR calc Af Amer 03/01/2014 54* >90 mL/min Final   Comment: (NOTE) The eGFR has been calculated using the CKD EPI equation. This calculation has not been validated in all clinical situations. eGFR's persistently <90 mL/min signify possible Chronic Kidney Disease.   . Anion gap 03/01/2014 7  5 - 15 Final  . Color, Urine 03/01/2014 YELLOW  YELLOW Final  . APPearance 03/01/2014 CLEAR  CLEAR Final  . Specific Gravity, Urine 03/01/2014 1.015  1.005 - 1.030 Final  . pH 03/01/2014 6.5  5.0 - 8.0 Final  . Glucose, UA 03/01/2014 NEGATIVE  NEGATIVE mg/dL Final  . Hgb urine dipstick 03/01/2014 NEGATIVE  NEGATIVE Final  . Bilirubin Urine 03/01/2014 NEGATIVE  NEGATIVE Final  . Ketones, ur 03/01/2014 NEGATIVE  NEGATIVE mg/dL Final  . Protein, ur 03/01/2014 NEGATIVE  NEGATIVE mg/dL Final  . Urobilinogen, UA 03/01/2014 1.0  0.0 - 1.0 mg/dL Final  . Nitrite 03/01/2014 NEGATIVE  NEGATIVE Final  . Leukocytes, UA 03/01/2014 NEGATIVE  NEGATIVE Final   MICROSCOPIC NOT DONE ON URINES WITH NEGATIVE PROTEIN, BLOOD, LEUKOCYTES, NITRITE, OR GLUCOSE  <  1000 mg/dL.     Assessment/Plan  1. Diabetes mellitus with renal manifestations, controlled Do hemoglobin A1c and microalbumin urine testing  2. CKD (chronic kidney disease) stage 2, GFR 60-89 ml/min CMP ordered  3. Essential hypertension -CMP - Microalbumin, urine  4. Dysphagia, pharyngoesophageal phase Asymptomatic  5. Left-sided low back pain with left-sided sciatica - Ambulatory epidural steroid injection; Future  6. HLD (hyperlipidemia) Lipid panel in the future  7. History of DVT (deep vein thrombosis) right leg Continue Xarelto. This will need to be stopped 1 week prior to epidural steroid  8. History of pulmonary embolism Continue Xarelto. This will need to be stopped 1 week prior to epidural steroid injection.  9. Controlled diabetes mellitus type II without complication - Hemoglobin A1c - Comprehensive metabolic panel  10. Hyperlipidemia - Lipid panel

## 2014-09-16 DIAGNOSIS — Z9181 History of falling: Secondary | ICD-10-CM | POA: Diagnosis not present

## 2014-09-16 DIAGNOSIS — M6281 Muscle weakness (generalized): Secondary | ICD-10-CM | POA: Diagnosis not present

## 2014-09-16 DIAGNOSIS — R262 Difficulty in walking, not elsewhere classified: Secondary | ICD-10-CM | POA: Diagnosis not present

## 2014-09-16 DIAGNOSIS — R41841 Cognitive communication deficit: Secondary | ICD-10-CM | POA: Diagnosis not present

## 2014-09-16 DIAGNOSIS — R260 Ataxic gait: Secondary | ICD-10-CM | POA: Diagnosis not present

## 2014-09-16 DIAGNOSIS — R278 Other lack of coordination: Secondary | ICD-10-CM | POA: Diagnosis not present

## 2014-09-16 LAB — LIPID PANEL
Chol/HDL Ratio: 2 ratio units (ref 0.0–4.4)
Cholesterol, Total: 145 mg/dL (ref 100–199)
HDL: 71 mg/dL (ref >39)
LDL CALC: 57 mg/dL (ref 0–99)
Triglycerides: 85 mg/dL (ref 0–149)
VLDL CHOLESTEROL CAL: 17 mg/dL (ref 5–40)

## 2014-09-16 LAB — COMPREHENSIVE METABOLIC PANEL
ALT: 11 IU/L (ref 0–32)
AST: 19 IU/L (ref 0–40)
Albumin/Globulin Ratio: 2 (ref 1.1–2.5)
Albumin: 4.2 g/dL (ref 3.2–4.6)
Alkaline Phosphatase: 86 IU/L (ref 39–117)
BUN/Creatinine Ratio: 21 (ref 11–26)
BUN: 27 mg/dL (ref 10–36)
Bilirubin Total: 0.3 mg/dL (ref 0.0–1.2)
CHLORIDE: 102 mmol/L (ref 97–108)
CO2: 24 mmol/L (ref 18–29)
Calcium: 10.2 mg/dL (ref 8.7–10.3)
Creatinine, Ser: 1.29 mg/dL — ABNORMAL HIGH (ref 0.57–1.00)
GFR calc Af Amer: 42 mL/min/{1.73_m2} — ABNORMAL LOW (ref >59)
GFR calc non Af Amer: 37 mL/min/{1.73_m2} — ABNORMAL LOW (ref >59)
GLOBULIN, TOTAL: 2.1 g/dL (ref 1.5–4.5)
GLUCOSE: 97 mg/dL (ref 65–99)
Potassium: 5.2 mmol/L (ref 3.5–5.2)
SODIUM: 140 mmol/L (ref 134–144)
Total Protein: 6.3 g/dL (ref 6.0–8.5)

## 2014-09-16 LAB — HEMOGLOBIN A1C
ESTIMATED AVERAGE GLUCOSE: 126 mg/dL
HEMOGLOBIN A1C: 6 % — AB (ref 4.8–5.6)

## 2014-09-17 DIAGNOSIS — R262 Difficulty in walking, not elsewhere classified: Secondary | ICD-10-CM | POA: Diagnosis not present

## 2014-09-17 DIAGNOSIS — M6281 Muscle weakness (generalized): Secondary | ICD-10-CM | POA: Diagnosis not present

## 2014-09-17 DIAGNOSIS — R41841 Cognitive communication deficit: Secondary | ICD-10-CM | POA: Diagnosis not present

## 2014-09-17 DIAGNOSIS — R278 Other lack of coordination: Secondary | ICD-10-CM | POA: Diagnosis not present

## 2014-09-17 DIAGNOSIS — Z9181 History of falling: Secondary | ICD-10-CM | POA: Diagnosis not present

## 2014-09-17 DIAGNOSIS — R260 Ataxic gait: Secondary | ICD-10-CM | POA: Diagnosis not present

## 2014-09-17 LAB — MICROALBUMIN, URINE: Microalbumin, Urine: 35.2 ug/mL

## 2014-09-21 DIAGNOSIS — R262 Difficulty in walking, not elsewhere classified: Secondary | ICD-10-CM | POA: Diagnosis not present

## 2014-09-21 DIAGNOSIS — Z9181 History of falling: Secondary | ICD-10-CM | POA: Diagnosis not present

## 2014-09-21 DIAGNOSIS — R278 Other lack of coordination: Secondary | ICD-10-CM | POA: Diagnosis not present

## 2014-09-21 DIAGNOSIS — R41841 Cognitive communication deficit: Secondary | ICD-10-CM | POA: Diagnosis not present

## 2014-09-21 DIAGNOSIS — R260 Ataxic gait: Secondary | ICD-10-CM | POA: Diagnosis not present

## 2014-09-21 DIAGNOSIS — M6281 Muscle weakness (generalized): Secondary | ICD-10-CM | POA: Diagnosis not present

## 2014-09-25 DIAGNOSIS — R262 Difficulty in walking, not elsewhere classified: Secondary | ICD-10-CM | POA: Diagnosis not present

## 2014-09-25 DIAGNOSIS — M6281 Muscle weakness (generalized): Secondary | ICD-10-CM | POA: Diagnosis not present

## 2014-09-25 DIAGNOSIS — Z9181 History of falling: Secondary | ICD-10-CM | POA: Diagnosis not present

## 2014-09-25 DIAGNOSIS — R278 Other lack of coordination: Secondary | ICD-10-CM | POA: Diagnosis not present

## 2014-09-25 DIAGNOSIS — R41841 Cognitive communication deficit: Secondary | ICD-10-CM | POA: Diagnosis not present

## 2014-09-25 DIAGNOSIS — R260 Ataxic gait: Secondary | ICD-10-CM | POA: Diagnosis not present

## 2014-10-13 DIAGNOSIS — H6123 Impacted cerumen, bilateral: Secondary | ICD-10-CM | POA: Diagnosis not present

## 2014-10-13 DIAGNOSIS — H906 Mixed conductive and sensorineural hearing loss, bilateral: Secondary | ICD-10-CM | POA: Diagnosis not present

## 2014-10-13 DIAGNOSIS — Z974 Presence of external hearing-aid: Secondary | ICD-10-CM | POA: Diagnosis not present

## 2014-10-29 ENCOUNTER — Ambulatory Visit (INDEPENDENT_AMBULATORY_CARE_PROVIDER_SITE_OTHER): Payer: Medicare Other | Admitting: Neurology

## 2014-10-29 ENCOUNTER — Encounter: Payer: Self-pay | Admitting: Neurology

## 2014-10-29 VITALS — BP 170/78 | HR 83 | Ht 63.0 in | Wt 169.0 lb

## 2014-10-29 DIAGNOSIS — E785 Hyperlipidemia, unspecified: Secondary | ICD-10-CM

## 2014-10-29 DIAGNOSIS — G3184 Mild cognitive impairment, so stated: Secondary | ICD-10-CM

## 2014-10-29 DIAGNOSIS — F32A Depression, unspecified: Secondary | ICD-10-CM

## 2014-10-29 DIAGNOSIS — I10 Essential (primary) hypertension: Secondary | ICD-10-CM | POA: Diagnosis not present

## 2014-10-29 DIAGNOSIS — F4323 Adjustment disorder with mixed anxiety and depressed mood: Secondary | ICD-10-CM | POA: Diagnosis not present

## 2014-10-29 DIAGNOSIS — F329 Major depressive disorder, single episode, unspecified: Secondary | ICD-10-CM | POA: Diagnosis not present

## 2014-10-29 NOTE — Progress Notes (Signed)
STROKE NEUROLOGY FOLLOW UP NOTE  NAME: Christine Pierce DOB: [birth year]  REASON FOR VISIT: stroke follow up HISTORY FROM: chart and friend  Today we had the pleasure of seeing Christine Pierce in follow-up at our Neurology Clinic. Pt was accompanied by friend.   History Summary Christine Pierce is a 81 y.o. female with PMH of HTN, HLD, COPD, GERD, CKD, and anxiety who was seen as a new patient for cognitive concerns on 12/26/13  Pt was doing well and in her normal health until around 2000 when she lost her mom and son within 2-year period. Pt was depressed at that time and became social withdraw. She was not going out and stayed in her house, lived alone, not eating well, not dressing well, not speak well, very disorganized in house, rodents everywhere, uncontrolled buying through telephone, accumulated a lot of stuff in house such that no one could get into her house. She spent all her money and also borrowed significant amount of debt. Eventually, she had to sale her house to pay debt and she was moved to ALF about 1.5 years ago. Since then, she was eating better, more cooperative, had financial help from her friend, although still a lot of debt left but she is going to the right direction.  While she lives in ALF, friends found her having trouble to learn new things, such as how to use inhalers, remote control, and new hearing aids, how to dial numbers from her new phones, etc. She was taught many times but just not able to do it herself. However, she was able to do all her ADLs, writing checks, and still very talkative. She denies any weakness, numbness, LOC, vision changes or speech changes. She denies she has any memory problem. She follows with her PCP every 2-3 months and she denies any smoking, alcohol or illicit drugs. She was referred by Dr. Chilton Si for cognitive evaluation.  Had conversation with her during visit, she behaved very impulsive, pressured speech, super talkative and with minimal  cognitive impairment. However, her MOCA was 27/30. She was not felt to have cognitive impairment but rather decreased concentration and attention with picture of ADHD type psychiatric disorders. She was recommended to have psych follow up and check dementia labs.  Follow up 05/11/14 - she was hospitalized first from 02/13/2014 to 02/21/2014 for L2 compression fracture following a fall. She under went kyphoplasty on 02/19/14. Hospitalization was complicated by urinary tract infection. She was then discharged to [facility] skilled nursing facility.  She was re-hospitalized from 02/27/2014 to 03/01/2014 for chest pain and right hip pain. Cardiac workup did not disclose myocardial infarction. However, she was found to have a pulmonary embolus and an acute DVT. She was discharged with Xarelto. She had urinary retention which required Foley catheter insertion. Lab showed acute renal failure, which was resolved prior to discharge. She is still on ASA.   Patient currently still in [facility]. Her Foley catheter has been removed and she was able to void on her own. Interestingly, she is still on oxybutynin which she has been on for several years Qhs to decreased urination at night. As per her friend, for the last several months, her cognition is getting worse. Her short memory loss seems getting worse, for example, she kept asking the friend "how about her niece" even giving answers everytime. She also less active as before, more sedentary life style. She was put on Addrell and her celexa was discontinued. Friend did not see much improvement.  She has not seen psychiatrist or psychologist yet.   Interval History During the interval time, she was doing well. Still lives in ALF. General cognition is good and she is sharp. However, mentally, she is not very stable. She has episodes of confusion, decreased memory, depression and anxiety. She has seen psychiatrist for anxiety and depression treatment.   She has  stopped oxybutynin but did not find any improvement of cognition but has frequent urination at night which makes her not sleep well. She is on Mirabegron for bladder control now. Her anxiety and depression also makes her not sleep well. She is on melatonin.   She has reasonable worries. Her son in [place] comes to see her 3-4 time a year. She felt not get much family support. She also worries about her insurance coverage for her ALF. She wants to go back to independent living. She worries about her future and what is going to happen to her next at the age of 44, etc.   She is still on Xarelto now and she is going to discuss with PCP next visit. BP 170/78 today but she is on norvasc and losartan and she said her BP OK in ALF.   REVIEW OF SYSTEMS: Full 14 system review of systems performed and notable only for those listed below and in HPI above, all others are negative:  Constitutional:   Cardiovascular:  Ear/Nose/Throat:   Skin:  Eyes:   Respiratory:   Gastroitestinal:   Genitourinary:  Hematology/Lymphatic:   Endocrine:  Musculoskeletal:   Allergy/Immunology:   Neurological:   Psychiatric: confusion, decreased concentration, depression, nervous and anxious Sleep: insomnia  The following represents the patient's updated allergies and side effects list: Allergies  Allergen Reactions  . Macrodantin [Nitrofurantoin Macrocrystal]     Affects kidneys  . Proventil [Albuterol]     Patient unable to coordinate use of inhaler    The neurologically relevant items on the patient's problem list were reviewed on today's visit.  Neurologic Examination  A problem focused neurological exam (12 or more points of the single system neurologic examination, vital signs counts as 1 point, cranial nerves count for 8 points) was performed.  Blood pressure 170/78, pulse 83, height 5\' 3"  (1.6 m), weight 169 lb (76.658 kg).  General - Well nourished, well developed, in no apparent  distress.  Ophthalmologic - Sharp disc margins OU.  Cardiovascular - Regular rate and rhythm with no murmur. Carotid pulses were 2+ without bruits .   Neck - supple, no nuchal rigidity .  Mental Status -  Level of arousal and orientation to year and month, place, and person were intact, but not to date. Language including expression, naming, repetition, comprehension, reading, and writing was assessed and found intact. Attention span and concentration were normal. Recent and remote memory were 5/5 registration and 3/5 delayed recall with category cues 5/5. Fund of Knowledge was assessed and was intact.  MOCA  visuospatial - executive 5/5 Naming - 3/3 Memory -  Attention - 2/2, 1/1, 3/3 Language - 2/2, 0/1 Abstraction - 2/2 Delayed recall - 3/5 Orientation - 5/6  Total - 26/30  Cranial Nerves II - XII - II - Visual field intact OU. III, IV, VI - Extraocular movements intact. V - Facial sensation intact bilaterally. VII - Facial movement intact bilaterally. VIII - Hearing & vestibular intact bilaterally. X - Palate elevates symmetrically. XI - Chin turning & shoulder shrug intact bilaterally. XII - Tongue protrusion intact.  Motor Strength - The patient's strength was  normal in all extremities and pronator drift was absent. Bulk was normal and fasciculations were absent.  Motor Tone - Muscle tone was assessed at the neck and appendages and was normal.  Reflexes - The patient's reflexes were normal in all extremities and she had no pathological reflexes.  Sensory - Light touch, temperature/pinprick were assessed and were normal.   Coordination - The patient had normal movements in the hands and feet with no ataxia or dysmetria. Tremor was absent.  Gait and Station - walk with rolling walker, slow, small stride, stooped posturing.  Data reviewed: I personally reviewed the images and agree with the radiology interpretations.  VQ scan 02/27/14 - Findings  compatible high probability for pulmonary embolism.  Venous doppler 02/27/14 - No evidence of deep vein thrombosis involving the left lower extremity. - Findings consistent with indeterminate age, probably chronic, deep vein thrombosis involving the right popliteal vein. - No evidence of Baker&'s cyst on the right or left.  Component     Latest Ref Rng 12/01/2013 01/19/2014 02/27/2014  Hemoglobin A1C     4.8 - 5.6 %  6.2 (H)   Est. average glucose Bld gHb Est-mCnc       131   TSH     0.35 - 4.50 uIU/mL 1.43    D-Dimer, Quant     0.00 - 0.48 ug/mL-FEU   >20.00 (H)   Component     Latest Ref Rng 09/15/2014  Cholesterol, Total     100 - 199 mg/dL 161  Triglycerides     0 - 149 mg/dL 85  HDL Cholesterol     >39 mg/dL 71  VLDL Cholesterol Cal     5 - 40 mg/dL 17  LDL (calc)     0 - 99 mg/dL 57  Total CHOL/HDL Ratio     0.0 - 4.4 ratio units 2.0  Hemoglobin A1C     4.8 - 5.6 % 6.0 (H)  Est. average glucose Bld gHb Est-mCnc      126    Assessment: As you may recall, she is a 81 y.o. Caucasian female with PMH of HTN, HLD, COPD, GERD, CKD, and anxiety presented first on 12/26/13 as a new consult for cognitive decline. However, on repeated neuro exam and interaction with pt, she does not have dementia, instead her mind is sharp. Her MOCA 27/30, 23/30 and 26/30 on serial tests. 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 However, she does have general anxiety disorder and depression, worrying about everything and depressed about her situation which are all reasonable.  She has been following up with psychiatrist. She had L2 compression fracture and probable PE and DVT last year, and still on Xarelto. Her oxybutynin has stopped due to anticholinergic effects. She has insomnia on melatonin. At this time, she is cognitively intact and no sign of dementia but she dose have anxiety and depression. Would recommend psychology referral.    Plan:   - Pt to discuss with Dr.  Chilton Si about Xarelto course. If Xarelto is to be discontinued, recommend baby ASA daily. - pt to discuss with Dr. Chilton Si regarding independent living. If that is a possibility, recommend to have home health nurse visit - check BP at ALF and record and bring over to Dr. Chilton Si on you next visit on 11/10/14 for BP meds adjustment if needed. - recommend psychologist referral for anxiety and depression. - follow up with PCP regularly. - RTC in 6 months.  I spent more than 25 minutes of face  to face time with the patient. Greater than 50% of time was spent in counseling and coordination of care.   No orders of the defined types were placed in this encounter.    No orders of the defined types were placed in this encounter.    Patient Instructions  - discuss with Dr. Chilton Si about Xarelto course. If Xarelto is to be discontinued, recommend baby ASA daily. - discuss with Dr. Chilton Si regarding independent living. If that is a possibility, recommend to have home health nurse visit - recommend psychologist referral for cognitive behavior therapy. - follow up with PCP regularly - follow up in 6 months.    Marvel Plan, MD PhD [facility] [address] [place], [place] [address] [phone]

## 2014-10-29 NOTE — Patient Instructions (Addendum)
-   discuss with Dr. Chilton Si about Xarelto course. If Xarelto is to be discontinued, recommend baby ASA daily. - discuss with Dr. Chilton Si regarding independent living. If that is a possibility, recommend to have home health nurse visit - recommend psychologist referral for cognitive behavior therapy. - follow up with PCP regularly - follow up in 6 months.

## 2014-11-10 DIAGNOSIS — H906 Mixed conductive and sensorineural hearing loss, bilateral: Secondary | ICD-10-CM | POA: Diagnosis not present

## 2014-11-10 DIAGNOSIS — H903 Sensorineural hearing loss, bilateral: Secondary | ICD-10-CM | POA: Diagnosis not present

## 2014-11-16 ENCOUNTER — Ambulatory Visit (INDEPENDENT_AMBULATORY_CARE_PROVIDER_SITE_OTHER): Payer: Medicare Other

## 2014-11-16 ENCOUNTER — Other Ambulatory Visit: Payer: Medicare Other

## 2014-11-16 DIAGNOSIS — E1121 Type 2 diabetes mellitus with diabetic nephropathy: Secondary | ICD-10-CM | POA: Diagnosis not present

## 2014-11-16 DIAGNOSIS — Z23 Encounter for immunization: Secondary | ICD-10-CM

## 2014-11-16 DIAGNOSIS — I2699 Other pulmonary embolism without acute cor pulmonale: Secondary | ICD-10-CM

## 2014-11-16 DIAGNOSIS — E1129 Type 2 diabetes mellitus with other diabetic kidney complication: Secondary | ICD-10-CM

## 2014-11-17 LAB — CBC WITH DIFFERENTIAL
BASOS ABS: 0.1 10*3/uL (ref 0.0–0.2)
Basos: 1 %
EOS (ABSOLUTE): 0.1 10*3/uL (ref 0.0–0.4)
EOS: 2 %
HEMATOCRIT: 37 % (ref 34.0–46.6)
HEMOGLOBIN: 12.1 g/dL (ref 11.1–15.9)
IMMATURE GRANULOCYTES: 0 %
Immature Grans (Abs): 0 10*3/uL (ref 0.0–0.1)
Lymphocytes Absolute: 1.6 10*3/uL (ref 0.7–3.1)
Lymphs: 31 %
MCH: 28.7 pg (ref 26.6–33.0)
MCHC: 32.7 g/dL (ref 31.5–35.7)
MCV: 88 fL (ref 79–97)
MONOCYTES: 10 %
Monocytes Absolute: 0.6 10*3/uL (ref 0.1–0.9)
Neutrophils Absolute: 3 10*3/uL (ref 1.4–7.0)
Neutrophils: 56 %
RBC: 4.21 x10E6/uL (ref 3.77–5.28)
RDW: 14.9 % (ref 12.3–15.4)
WBC: 5.4 10*3/uL (ref 3.4–10.8)

## 2014-11-17 LAB — COMPREHENSIVE METABOLIC PANEL
ALT: 5 IU/L (ref 0–32)
AST: 10 IU/L (ref 0–40)
Albumin/Globulin Ratio: 2 (ref 1.1–2.5)
Albumin: 4.5 g/dL (ref 3.2–4.6)
Alkaline Phosphatase: 78 IU/L (ref 39–117)
BUN/Creatinine Ratio: 22 (ref 11–26)
BUN: 27 mg/dL (ref 10–36)
Bilirubin Total: 0.4 mg/dL (ref 0.0–1.2)
CALCIUM: 10.6 mg/dL — AB (ref 8.7–10.3)
CO2: 21 mmol/L (ref 18–29)
Chloride: 104 mmol/L (ref 97–108)
Creatinine, Ser: 1.22 mg/dL — ABNORMAL HIGH (ref 0.57–1.00)
GFR calc Af Amer: 45 mL/min/{1.73_m2} — ABNORMAL LOW (ref >59)
GFR, EST NON AFRICAN AMERICAN: 39 mL/min/{1.73_m2} — AB (ref >59)
GLOBULIN, TOTAL: 2.3 g/dL (ref 1.5–4.5)
GLUCOSE: 94 mg/dL (ref 65–99)
Potassium: 4.9 mmol/L (ref 3.5–5.2)
SODIUM: 140 mmol/L (ref 134–144)
Total Protein: 6.8 g/dL (ref 6.0–8.5)

## 2014-11-18 ENCOUNTER — Ambulatory Visit (INDEPENDENT_AMBULATORY_CARE_PROVIDER_SITE_OTHER): Payer: Medicare Other | Admitting: Internal Medicine

## 2014-11-18 ENCOUNTER — Encounter: Payer: Self-pay | Admitting: Internal Medicine

## 2014-11-18 VITALS — BP 134/68 | HR 77 | Temp 97.7°F | Resp 14 | Ht 63.0 in | Wt 168.0 lb

## 2014-11-18 DIAGNOSIS — F429 Obsessive-compulsive disorder, unspecified: Secondary | ICD-10-CM

## 2014-11-18 DIAGNOSIS — M5442 Lumbago with sciatica, left side: Secondary | ICD-10-CM | POA: Diagnosis not present

## 2014-11-18 DIAGNOSIS — E785 Hyperlipidemia, unspecified: Secondary | ICD-10-CM | POA: Diagnosis not present

## 2014-11-18 DIAGNOSIS — I10 Essential (primary) hypertension: Secondary | ICD-10-CM | POA: Diagnosis not present

## 2014-11-18 DIAGNOSIS — F411 Generalized anxiety disorder: Secondary | ICD-10-CM

## 2014-11-18 DIAGNOSIS — F42 Obsessive-compulsive disorder: Secondary | ICD-10-CM | POA: Diagnosis not present

## 2014-11-18 DIAGNOSIS — R1314 Dysphagia, pharyngoesophageal phase: Secondary | ICD-10-CM | POA: Diagnosis not present

## 2014-11-18 DIAGNOSIS — E1129 Type 2 diabetes mellitus with other diabetic kidney complication: Secondary | ICD-10-CM

## 2014-11-18 DIAGNOSIS — N182 Chronic kidney disease, stage 2 (mild): Secondary | ICD-10-CM

## 2014-11-18 DIAGNOSIS — Z86718 Personal history of other venous thrombosis and embolism: Secondary | ICD-10-CM | POA: Diagnosis not present

## 2014-11-18 DIAGNOSIS — Z86711 Personal history of pulmonary embolism: Secondary | ICD-10-CM

## 2014-11-18 DIAGNOSIS — Z7901 Long term (current) use of anticoagulants: Secondary | ICD-10-CM | POA: Diagnosis not present

## 2014-11-18 DIAGNOSIS — E1121 Type 2 diabetes mellitus with diabetic nephropathy: Secondary | ICD-10-CM | POA: Diagnosis not present

## 2014-11-18 MED ORDER — ASPIRIN EC 81 MG PO TBEC: DELAYED_RELEASE_TABLET | ORAL | Status: DC

## 2014-11-18 MED ORDER — FLUVOXAMINE MALEATE 100 MG PO TABS: ORAL_TABLET | ORAL | Status: DC

## 2014-11-18 NOTE — Progress Notes (Signed)
Patient ID: Christine Pierce, female   DOB: 11-27-1921, 81 y.o.   MRN: [id number]    Facility  [facility]    Place of Service:   OFFICE    Allergies  Allergen Reactions  . Macrodantin [Nitrofurantoin Macrocrystal]     Affects kidneys  . Proventil [Albuterol]     Patient unable to coordinate use of inhaler    Chief Complaint  Patient presents with  . Medical Management of Chronic Issues    2 month follow-up, disucss labs (copy printed)     HPI:    Seen by neurologist 10/29/14, Dr. Lavera Guise.  Frequent urination at night. Wakes her and she cannot return to sleep.  Begins to think about what she is going to do the next day. Continues to worry about her finances. Her friend says that she continues to be anxious. Worries about a move she wants to make to [facility] independent area. Was placed in [facility]. Will be in the bridge program for independent living at [place].  She has anxiety about living alone.  Having pain in the back, both legs, and in the arms.  Diabetes mellitus with renal manifestations, controlled - controlled  CKD (chronic kidney disease) stage 2, GFR 60-89 ml/min - stable  Essential hypertension - controlled  Dysphagia, pharyngoesophageal phase - improved  HLD (hyperlipidemia) - controlled  History of DVT (deep vein thrombosis) right leg -concur December 2015. It is benign months on anticoagulation. There have been no indications of relapse.   History of pulmonary embolism - Concur December 2015. No relapse.  Left-sided low back pain with left-sided sciatica - Continues to be a problem. She would like to get epidural injection. She should be eligible for this since we are discontinuing her Xarelto. -   Chronic anticoagulation - she has been 9 months since her pulmonary embolus and DVT. There've been no relapses. She has been on Xarelto.   Anxiety state - continues to be a problem with worries over her body and living arrangements. This seems to be a  part of her OCD.   Obsessive compulsive disorder - Fluvoxamine seems to been some help to her, but this is not resolved.     Medications: Patient's Medications  New Prescriptions   No medications on file  Previous Medications   ACETAMINOPHEN (TYLENOL ARTHRITIS PAIN) 650 MG CR TABLET    Take 650 mg by mouth 3 (three) times daily. For arthritis pain   ACETAMINOPHEN (TYLENOL) 500 MG TABLET    Take 500 mg by mouth every 4 (four) hours as needed (pain).    AMLODIPINE (NORVASC) 10 MG TABLET    Take 1 tablet (10 mg total) by mouth daily.   CALCITONIN, SALMON, (MIACALCIN/FORTICAL) 200 UNIT/ACT NASAL SPRAY    Place 1 spray into alternate nostrils every other day.    FLUVOXAMINE (LUVOX) 50 MG TABLET    One at bed to help nerves   LOSARTAN (COZAAR) 50 MG TABLET    Take 1 tablet (50 mg total) by mouth daily.   MELATONIN 5 MG CAPS    Take 1 capsule by mouth at bedtime as needed.   MIRABEGRON ER (MYRBETRIQ) 25 MG TB24 TABLET    One daily to help bladder control   MISC. DEVICES (ROLLER WALKER) MISC    Rolling Walker with seat and brakes.   NITROGLYCERIN (NITROSTAT) 0.4 MG SL TABLET    Place 0.4 mg under the tongue every 5 (five) minutes as needed for chest pain.   PHENOL (CHLORASEPTIC) 1.4 %  LIQD    Use as directed 1 spray in the mouth or throat as needed for throat irritation / pain.   POLYETHYLENE GLYCOL (MIRALAX / GLYCOLAX) PACKET    Take 17 g by mouth daily as needed for mild constipation.   RIVAROXABAN (XARELTO) 20 MG TABS TABLET    Take 1 tablet (20 mg total) by mouth daily with supper.   SENNA-DOCUSATE (SENOKOT-S) 8.6-50 MG PER TABLET    One at bed to prevent constipation  Modified Medications   No medications on file  Discontinued Medications   No medications on file     Review of Systems  Constitutional: Positive for fatigue. Negative for fever, chills, diaphoresis, activity change, appetite change and unexpected weight change.  HENT: Positive for hearing loss.   Eyes: Negative.     Respiratory: Positive for cough and shortness of breath.        Complains of a hoarse voice for the last few months. There is no pain.  Cardiovascular: Positive for leg swelling. Negative for palpitations.  Gastrointestinal: Positive for constipation and rectal pain.       Occasional fecal incontinence. Sometimes has difficulty swallowing. Solids and liquids seem to be involved. There is no odynophagia.  Endocrine:       Elevated blood sugars. Currently on "dietary control". She is not following her diet closely.  Genitourinary: Positive for urgency and frequency. Negative for dysuria, decreased urine volume, vaginal bleeding, vaginal discharge, enuresis, genital sores, vaginal pain and pelvic pain.       UTI 02/17/14. Incontinent of urine.  Musculoskeletal:       Generalized weakness: Pain in the right hip area. Requires assistance in getting up and down. At risk for falls.  Skin: Negative.   Allergic/Immunologic: Negative.   Neurological: Positive for weakness.       Memory loss.  Hematological: Negative.   Psychiatric/Behavioral: Positive for confusion. The patient is nervous/anxious.        History of hoarding behavior.Past history of [employer] buying of products on television.    Filed Vitals:   11/18/14 1328  BP: 134/68  Pulse: 77  Temp: 97.7 F (36.5 C)  TempSrc: Oral  Resp: 14  Height: '5\' 3"'  (1.6 m)  Weight: 168 lb (76.204 kg)  SpO2: 97%   Body mass index is 29.77 kg/(m^2).  Physical Exam  Constitutional: She is oriented to person, place, and time. No distress.  Overweight  HENT:  Head: Atraumatic.  Partial deafness. Uses hearing aids.  Eyes:  Wears prescription lenses.  Neck: No JVD present. No tracheal deviation present. No thyromegaly present.  Some tenderness with movement laterally to either side.  Cardiovascular: Normal rate and regular rhythm.  Exam reveals no friction rub.   Murmur (3/6 SEM at LSB and aortic ejection) heard. Bilateral varicose veins   Pulmonary/Chest: No respiratory distress. She has no wheezes. She has no rales. She exhibits tenderness (left lower chest wall).  Tender left flank area lower rib margins laterally and slightly anteriorly. Voice is slightly hoarse.  Abdominal: Soft. Bowel sounds are normal. She exhibits no distension and no mass. There is no tenderness.  Genitourinary: Guaiac negative stool.  Anal fissure at 6 o'clock  Musculoskeletal: Normal range of motion. She exhibits no edema or tenderness.  Tender the greater trochanter and right groin. She is hobbled by this decrease in her stride length.  Lymphadenopathy:    She has no cervical adenopathy.  Neurological: She is alert and oriented to person, place, and time. No cranial nerve  deficit. She exhibits normal muscle tone. Coordination normal.  04/30/12 MMSE 27/30. Passed clock drawing. Having some trouble in sequencing events and in telling a coherent story.  Insensitive to vibration in the feet.  Skin: No rash noted. She is not diaphoretic. No erythema. No pallor.  Psychiatric: She has a normal mood and affect. Her behavior is normal. Judgment and thought content normal.     Labs reviewed: Lab Summary Latest Ref Rng 11/16/2014 09/15/2014 03/01/2014 02/28/2014 02/27/2014  Hemoglobin 11.1 - 15.9 g/dL 12.1 (None) 10.8(L) 11.1(L) 11.1(L)  Hematocrit 34.0 - 46.6 % 37.0 (None) 33.9(L) 34.6(L) 33.9(L)  White count 3.4 - 10.8 x10E3/uL 5.4 (None) 13.2(H) 11.1(H) 13.9(H)  Platelet count 150 - 400 K/uL (None) (None) 227 184 198  Sodium 134 - 144 mmol/L 140 140 136 135 134(L)  Potassium 3.5 - 5.2 mmol/L 4.9 5.2 4.7 4.1 3.1(L)  Calcium 8.7 - 10.3 mg/dL 10.6(H) 10.2 8.8 8.7 8.5  Phosphorus - (None) (None) (None) (None) (None)  Creatinine 0.57 - 1.00 mg/dL 1.22(H) 1.29(H) 1.03 1.65(H) 1.99(H)  AST 0 - 40 IU/L 10 19 (None) (None) 23  Alk Phos 39 - 117 IU/L 78 86 (None) (None) 72  Bilirubin 0.0 - 1.2 mg/dL 0.4 0.3 (None) (None) 0.7  Glucose 65 - 99 mg/dL 94 97 103(H)  98 137(H)  Cholesterol - (None) (None) (None) (None) (None)  HDL cholesterol >39 mg/dL (None) 71 (None) (None) (None)  Triglycerides 0 - 149 mg/dL (None) 85 (None) (None) (None)  LDL Direct - (None) (None) (None) (None) (None)  LDL Calc 0 - 99 mg/dL (None) 57 (None) (None) (None)  Total protein 6.0 - 8.3 g/dL (None) (None) (None) (None) 5.4(L)  Albumin 3.2 - 4.6 g/dL 4.5 4.2 (None) (None) 3.0(L)   Lab Results  Component Value Date   TSH 1.43 12/01/2013   Lab Results  Component Value Date   BUN 27 11/16/2014   Lab Results  Component Value Date   HGBA1C 6.0* 09/15/2014       Assessment/Plan  1. Diabetes mellitus with renal manifestations, controlled Not currently on medication. Follows a no added sugar diet.  2. CKD (chronic kidney disease) stage 2, GFR 60-89 ml/min  Stable  3. Essential hypertension Controlled  4. Dysphagia, pharyngoesophageal phase  Improved  5. HLD (hyperlipidemia) Controlled  6. History of DVT (deep vein thrombosis) right leg Discontinue Xarelto  - aspirin EC 81 MG tablet; One daily for anticoagulation  Dispense: 100 tablet; Refill: 5  7. History of pulmonary embolism Discontinue Xarelto  - aspirin EC 81 MG tablet; One daily for anticoagulation  Dispense: 100 tablet; Refill: 5  8. Left-sided low back pain with left-sided sciatica Schedule epidural  9. Chronic anticoagulation Discontinue Xarelto and substitute aspirin 81 mg daily  10. Anxiety state Increased dose of fluvoxamine (LUVOX) 100 MG tablet; One each night to help nerves  Dispense: 30 tablet; Refill: 5  11. Obsessive compulsive disorder Increased dose of fluvoxamine (LUVOX) 100 MG tablet; One each night to help nerves  Dispense: 30 tablet; Refill: 5

## 2014-11-20 ENCOUNTER — Telehealth: Payer: Self-pay | Admitting: *Deleted

## 2014-11-20 DIAGNOSIS — M549 Dorsalgia, unspecified: Secondary | ICD-10-CM

## 2014-11-20 DIAGNOSIS — R52 Pain, unspecified: Secondary | ICD-10-CM

## 2014-11-20 NOTE — Telephone Encounter (Signed)
Clarisse, Caregiver called and stated that patient needs a referral from her PCP for epideral injections through [facility] for pain management. Has been seeing Dr. Shelle Iron (Orthopedic) and he told them for PCP to schedule. Hasn't had an injection since last December. Please Advise.

## 2014-11-20 NOTE — Telephone Encounter (Signed)
Please make referral as requested

## 2014-11-23 NOTE — Telephone Encounter (Signed)
Order placed

## 2014-11-23 NOTE — Telephone Encounter (Signed)
Dorothy what should I choose to put this referral under?

## 2014-11-24 ENCOUNTER — Other Ambulatory Visit: Payer: Self-pay | Admitting: Internal Medicine

## 2014-11-24 DIAGNOSIS — M549 Dorsalgia, unspecified: Secondary | ICD-10-CM

## 2014-12-16 ENCOUNTER — Other Ambulatory Visit: Payer: PRIVATE HEALTH INSURANCE

## 2014-12-25 ENCOUNTER — Telehealth: Payer: Self-pay | Admitting: *Deleted

## 2014-12-25 NOTE — Telephone Encounter (Signed)
Christine Pierce, [place] #[phone] called and fax from [facility] wanting Fluvoxamine 100mg  at bedtime for OCD decreased due to it causing Dizziness, headaches and low BP of 113/65.  Fax from [facility] given to Dr. Montez Moritaarter to review and sign.

## 2015-01-06 ENCOUNTER — Ambulatory Visit
Admission: RE | Admit: 2015-01-06 | Discharge: 2015-01-06 | Disposition: A | Discharge status: Home / Self Care | Payer: Medicare Other | Source: Ambulatory Visit | Attending: Internal Medicine | Admitting: Internal Medicine

## 2015-01-06 DIAGNOSIS — M545 Low back pain: Secondary | ICD-10-CM | POA: Diagnosis not present

## 2015-01-06 DIAGNOSIS — M549 Dorsalgia, unspecified: Secondary | ICD-10-CM

## 2015-01-06 MED ORDER — IOHEXOL 180 MG/ML  SOLN
1.0000 mL | Freq: Once | INTRAMUSCULAR | Status: DC | PRN
  Administered 2015-01-06: 1 mL via EPIDURAL

## 2015-01-06 MED ORDER — METHYLPREDNISOLONE ACETATE 40 MG/ML INJ SUSP (RADIOLOG
120.0000 mg | Freq: Once | INTRAMUSCULAR | Status: AC
  Administered 2015-01-06: 120 mg via EPIDURAL

## 2015-01-06 NOTE — Discharge Instructions (Signed)

## 2015-01-06 NOTE — Progress Notes (Signed)
Right leg is weak.

## 2015-01-07 DIAGNOSIS — H43813 Vitreous degeneration, bilateral: Secondary | ICD-10-CM | POA: Diagnosis not present

## 2015-01-07 DIAGNOSIS — H353121 Nonexudative age-related macular degeneration, left eye, early dry stage: Secondary | ICD-10-CM | POA: Diagnosis not present

## 2015-01-07 DIAGNOSIS — H5310 Unspecified subjective visual disturbances: Secondary | ICD-10-CM | POA: Diagnosis not present

## 2015-01-07 DIAGNOSIS — Z961 Presence of intraocular lens: Secondary | ICD-10-CM | POA: Diagnosis not present

## 2015-01-12 DIAGNOSIS — N393 Stress incontinence (female) (male): Secondary | ICD-10-CM | POA: Diagnosis not present

## 2015-01-12 DIAGNOSIS — M545 Low back pain: Secondary | ICD-10-CM | POA: Diagnosis not present

## 2015-01-12 DIAGNOSIS — Z9181 History of falling: Secondary | ICD-10-CM | POA: Diagnosis not present

## 2015-01-12 DIAGNOSIS — R2681 Unsteadiness on feet: Secondary | ICD-10-CM | POA: Diagnosis not present

## 2015-01-12 DIAGNOSIS — R278 Other lack of coordination: Secondary | ICD-10-CM | POA: Diagnosis not present

## 2015-01-12 DIAGNOSIS — M6281 Muscle weakness (generalized): Secondary | ICD-10-CM | POA: Diagnosis not present

## 2015-01-13 ENCOUNTER — Telehealth: Payer: Self-pay

## 2015-01-13 NOTE — Telephone Encounter (Signed)
LFt vm for patient to call about literature. Need more clairfication.  LF message on [phone].

## 2015-01-13 NOTE — Telephone Encounter (Signed)
Lft vm for Clarisse pts HCPOA to call back about what literature pts needs.

## 2015-01-13 NOTE — Telephone Encounter (Signed)
Rn return Christine Pierce, pts HCPOA and her DPR. 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 Pt has ask her family members who are nurses about current literature. 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 Pt has mild memory issues, and knows the MMS test and scores usually 25 to 27 out of 30.Pt does not take any medications with her memory. Christine Pierce gave new address.

## 2015-01-13 NOTE — Telephone Encounter (Addendum)
Per HCPOA do no change patients address its her HCPOA address.  Rn receive incoming call from patient. Rn explain that her HCPOA Clarisse has already call about her being move. Rn explain there is no literature to give out. PT is stable according to Dr. Roda ShuttersXu notes. Pt has follow up appt in 05/2015. Pt verbalized understanding and is not having any symptoms or current health problems.

## 2015-01-13 NOTE — Telephone Encounter (Signed)
Clarisse returned call

## 2015-01-13 NOTE — Telephone Encounter (Signed)
Patient called regarding any literature or handouts we may have. Also wants to make us aware that [facility] [address] [place] [id number] [place] property as [facility]/assisted living) on Friday.

## 2015-01-14 DIAGNOSIS — M6281 Muscle weakness (generalized): Secondary | ICD-10-CM | POA: Diagnosis not present

## 2015-01-14 DIAGNOSIS — Z9181 History of falling: Secondary | ICD-10-CM | POA: Diagnosis not present

## 2015-01-14 DIAGNOSIS — R2681 Unsteadiness on feet: Secondary | ICD-10-CM | POA: Diagnosis not present

## 2015-01-14 DIAGNOSIS — M545 Low back pain: Secondary | ICD-10-CM | POA: Diagnosis not present

## 2015-01-14 DIAGNOSIS — N393 Stress incontinence (female) (male): Secondary | ICD-10-CM | POA: Diagnosis not present

## 2015-01-14 DIAGNOSIS — R278 Other lack of coordination: Secondary | ICD-10-CM | POA: Diagnosis not present

## 2015-01-15 DIAGNOSIS — M6281 Muscle weakness (generalized): Secondary | ICD-10-CM | POA: Diagnosis not present

## 2015-01-15 DIAGNOSIS — R2681 Unsteadiness on feet: Secondary | ICD-10-CM | POA: Diagnosis not present

## 2015-01-15 DIAGNOSIS — N393 Stress incontinence (female) (male): Secondary | ICD-10-CM | POA: Diagnosis not present

## 2015-01-15 DIAGNOSIS — Z9181 History of falling: Secondary | ICD-10-CM | POA: Diagnosis not present

## 2015-01-15 DIAGNOSIS — M545 Low back pain: Secondary | ICD-10-CM | POA: Diagnosis not present

## 2015-01-15 DIAGNOSIS — R278 Other lack of coordination: Secondary | ICD-10-CM | POA: Diagnosis not present

## 2015-01-18 DIAGNOSIS — Z9181 History of falling: Secondary | ICD-10-CM | POA: Diagnosis not present

## 2015-01-18 DIAGNOSIS — N393 Stress incontinence (female) (male): Secondary | ICD-10-CM | POA: Diagnosis not present

## 2015-01-18 DIAGNOSIS — M545 Low back pain: Secondary | ICD-10-CM | POA: Diagnosis not present

## 2015-01-18 DIAGNOSIS — R278 Other lack of coordination: Secondary | ICD-10-CM | POA: Diagnosis not present

## 2015-01-18 DIAGNOSIS — R2681 Unsteadiness on feet: Secondary | ICD-10-CM | POA: Diagnosis not present

## 2015-01-18 DIAGNOSIS — M6281 Muscle weakness (generalized): Secondary | ICD-10-CM | POA: Diagnosis not present

## 2015-01-19 DIAGNOSIS — R2681 Unsteadiness on feet: Secondary | ICD-10-CM | POA: Diagnosis not present

## 2015-01-19 DIAGNOSIS — N393 Stress incontinence (female) (male): Secondary | ICD-10-CM | POA: Diagnosis not present

## 2015-01-19 DIAGNOSIS — M6281 Muscle weakness (generalized): Secondary | ICD-10-CM | POA: Diagnosis not present

## 2015-01-19 DIAGNOSIS — Z9181 History of falling: Secondary | ICD-10-CM | POA: Diagnosis not present

## 2015-01-19 DIAGNOSIS — R278 Other lack of coordination: Secondary | ICD-10-CM | POA: Diagnosis not present

## 2015-01-19 DIAGNOSIS — M545 Low back pain: Secondary | ICD-10-CM | POA: Diagnosis not present

## 2015-01-20 DIAGNOSIS — R278 Other lack of coordination: Secondary | ICD-10-CM | POA: Diagnosis not present

## 2015-01-20 DIAGNOSIS — R2681 Unsteadiness on feet: Secondary | ICD-10-CM | POA: Diagnosis not present

## 2015-01-20 DIAGNOSIS — N393 Stress incontinence (female) (male): Secondary | ICD-10-CM | POA: Diagnosis not present

## 2015-01-20 DIAGNOSIS — Z9181 History of falling: Secondary | ICD-10-CM | POA: Diagnosis not present

## 2015-01-20 DIAGNOSIS — M545 Low back pain: Secondary | ICD-10-CM | POA: Diagnosis not present

## 2015-01-20 DIAGNOSIS — M6281 Muscle weakness (generalized): Secondary | ICD-10-CM | POA: Diagnosis not present

## 2015-01-21 DIAGNOSIS — M6281 Muscle weakness (generalized): Secondary | ICD-10-CM | POA: Diagnosis not present

## 2015-01-21 DIAGNOSIS — Z9181 History of falling: Secondary | ICD-10-CM | POA: Diagnosis not present

## 2015-01-21 DIAGNOSIS — R2681 Unsteadiness on feet: Secondary | ICD-10-CM | POA: Diagnosis not present

## 2015-01-21 DIAGNOSIS — N393 Stress incontinence (female) (male): Secondary | ICD-10-CM | POA: Diagnosis not present

## 2015-01-21 DIAGNOSIS — M545 Low back pain: Secondary | ICD-10-CM | POA: Diagnosis not present

## 2015-01-21 DIAGNOSIS — R278 Other lack of coordination: Secondary | ICD-10-CM | POA: Diagnosis not present

## 2015-01-22 DIAGNOSIS — Z9181 History of falling: Secondary | ICD-10-CM | POA: Diagnosis not present

## 2015-01-22 DIAGNOSIS — N393 Stress incontinence (female) (male): Secondary | ICD-10-CM | POA: Diagnosis not present

## 2015-01-22 DIAGNOSIS — M6281 Muscle weakness (generalized): Secondary | ICD-10-CM | POA: Diagnosis not present

## 2015-01-22 DIAGNOSIS — M545 Low back pain: Secondary | ICD-10-CM | POA: Diagnosis not present

## 2015-01-22 DIAGNOSIS — R278 Other lack of coordination: Secondary | ICD-10-CM | POA: Diagnosis not present

## 2015-01-22 DIAGNOSIS — R2681 Unsteadiness on feet: Secondary | ICD-10-CM | POA: Diagnosis not present

## 2015-01-24 DIAGNOSIS — R2681 Unsteadiness on feet: Secondary | ICD-10-CM | POA: Diagnosis not present

## 2015-01-24 DIAGNOSIS — M6281 Muscle weakness (generalized): Secondary | ICD-10-CM | POA: Diagnosis not present

## 2015-01-24 DIAGNOSIS — Z9181 History of falling: Secondary | ICD-10-CM | POA: Diagnosis not present

## 2015-01-24 DIAGNOSIS — N393 Stress incontinence (female) (male): Secondary | ICD-10-CM | POA: Diagnosis not present

## 2015-01-24 DIAGNOSIS — R278 Other lack of coordination: Secondary | ICD-10-CM | POA: Diagnosis not present

## 2015-01-24 DIAGNOSIS — M545 Low back pain: Secondary | ICD-10-CM | POA: Diagnosis not present

## 2015-01-25 ENCOUNTER — Ambulatory Visit (INDEPENDENT_AMBULATORY_CARE_PROVIDER_SITE_OTHER)
Admission: RE | Admit: 2015-01-25 | Discharge: 2015-01-25 | Disposition: A | Discharge status: Home / Self Care | Payer: Medicare Other | Source: Ambulatory Visit | Attending: Internal Medicine | Admitting: Internal Medicine

## 2015-01-25 ENCOUNTER — Ambulatory Visit (INDEPENDENT_AMBULATORY_CARE_PROVIDER_SITE_OTHER): Payer: Medicare Other | Admitting: Internal Medicine

## 2015-01-25 ENCOUNTER — Encounter: Payer: Self-pay | Admitting: Internal Medicine

## 2015-01-25 ENCOUNTER — Other Ambulatory Visit (INDEPENDENT_AMBULATORY_CARE_PROVIDER_SITE_OTHER): Payer: Medicare Other

## 2015-01-25 VITALS — BP 116/80 | HR 72 | Ht 67.0 in | Wt 168.8 lb

## 2015-01-25 DIAGNOSIS — R0600 Dyspnea, unspecified: Secondary | ICD-10-CM

## 2015-01-25 DIAGNOSIS — M6281 Muscle weakness (generalized): Secondary | ICD-10-CM | POA: Diagnosis not present

## 2015-01-25 DIAGNOSIS — R278 Other lack of coordination: Secondary | ICD-10-CM | POA: Diagnosis not present

## 2015-01-25 DIAGNOSIS — R490 Dysphonia: Secondary | ICD-10-CM | POA: Diagnosis not present

## 2015-01-25 DIAGNOSIS — N393 Stress incontinence (female) (male): Secondary | ICD-10-CM | POA: Diagnosis not present

## 2015-01-25 DIAGNOSIS — Z9181 History of falling: Secondary | ICD-10-CM | POA: Diagnosis not present

## 2015-01-25 DIAGNOSIS — R2681 Unsteadiness on feet: Secondary | ICD-10-CM | POA: Diagnosis not present

## 2015-01-25 DIAGNOSIS — M545 Low back pain: Secondary | ICD-10-CM | POA: Diagnosis not present

## 2015-01-25 LAB — BASIC METABOLIC PANEL
BUN: 32 mg/dL — AB (ref 6–23)
CO2: 19 mEq/L (ref 19–32)
CREATININE: 1.16 mg/dL (ref 0.40–1.20)
Calcium: 10.8 mg/dL — ABNORMAL HIGH (ref 8.4–10.5)
Chloride: 106 mEq/L (ref 96–112)
GFR: 46.53 mL/min — AB (ref >60.00)
Glucose, Bld: 100 mg/dL — ABNORMAL HIGH (ref 70–99)
Potassium: 4.7 mEq/L (ref 3.5–5.1)
Sodium: 138 mEq/L (ref 135–145)

## 2015-01-25 LAB — TSH: TSH: 2.02 u[IU]/mL (ref 0.35–4.50)

## 2015-01-25 LAB — BRAIN NATRIURETIC PEPTIDE: PRO B NATRI PEPTIDE: 68 pg/mL (ref 0.0–100.0)

## 2015-01-25 MED ORDER — FAMOTIDINE 20 MG PO TABS: ORAL_TABLET | ORAL | Status: AC

## 2015-01-25 MED ORDER — PANTOPRAZOLE SODIUM 40 MG PO TBEC: 40.0000 mg | DELAYED_RELEASE_TABLET | Freq: Every day | ORAL | Status: DC

## 2015-01-25 NOTE — Progress Notes (Signed)
Quick Note:  LMTCB ______ 

## 2015-01-25 NOTE — Progress Notes (Signed)
Quick Note:  Pt's daughter, Darden AmberClarisse, returned call. Informed her of the results and recs per MW. Pt verbalized understanding and denied any further questions or concerns at this time.   ______

## 2015-01-25 NOTE — Patient Instructions (Signed)
Please remember to go to the lab and x-ray department downstairs for your tests - we will call you with the results when they are available.     Pantoprazole (protonix) 40 mg   Take  30-60 min before first meal of the day and Pepcid (famotidine)  20 mg one @  bedtime until return to office - this is the best way to tell whether stomach acid is contributing to your problem.    GERD (REFLUX)  is an extremely common cause of respiratory symptoms just like yours , many times with no obvious heartburn at all.    It can be treated with medication, but also with lifestyle changes including elevation of the head of your bed (ideally with 6 inch  bed blocks),  Smoking cessation, avoidance of late meals, excessive alcohol, and avoid fatty foods, chocolate, peppermint, colas, red wine, and acidic juices such as orange juice.  NO MINT OR MENTHOL PRODUCTS SO NO COUGH DROPS  USE SUGARLESS CANDY INSTEAD (Jolley ranchers or Stover's or Life Savers) or even ice chips will also do - the key is to swallow to prevent all throat clearing. NO OIL BASED VITAMINS - use powdered substitutes.    If voice not better after 6 weeks rec refer to ENT

## 2015-01-25 NOTE — Progress Notes (Signed)
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 Pulmonary Consult    Referred by Christine. Murray Pierce for SOB with exertion   no sob sitting still  Sleeps ok on 3 pillows or has bad HB Cough more day than night more dry than wet Swallowing better since botox injection one week prior to OV   ? Some better with hfa saba/ worse with dpi spiriva/advair  >>rec Stop spiriva, advair, and losartan Depomedrol 120 mg today  micardis 80/12.5 one daily  prilosec 40 Take 30- 60 min before your first and last meals of the day  GERD diet  Only use your albuterol (proair) as a rescue medication   Work on inhaler technique  If can't learn  Technique may need to use albuterol as needed per neb.    GERD diet    10/16/13 Follow up ov/ Christine Pierce  Pt was seen for pulmonary consult 1 month for for DOE and cough .  Has known swallow issues.  She is a never smoker . She had a dx of COPD, last ov she was taken off of Advair and spiriva .  Losartan was changed to micardis hct .  Since last ov she is feeling better.  Reports DOE and hoarseness  75% improved since last ov..  Rec Remain on current regimen  Follow up Christine. Sherene Pierce  In 3-4 weeks with PFT    12/02/2013 f/u ov/Christine Pierce re: cough and sob / could not perform pfts Chief Complaint  Patient presents with  . Follow-up    Pt states that SOB and cough are unchanged. No new co's today.   300 hundred feet gives out, sob and fatigue about the same time, no worse off all inhalers  Cough now reported worse at hs since had botox for es stricture> GI f/u planned 12/02/13  rec Delsym cough syrup 2 tsp  every 12 hours as needed for cough instead of mucinex dm Add pepcid ac 20 mg and chlortrimeton 4 mg at bedtime to see if helps the night time cough (both are over the counter)  GERD diet   Pulmonary follow up is as needed - ENT evaluation may need to be considered if hoarseness persists despite diet and GI input (Christine Pierce)  In the event that you have attacks of breathing difficulty rec albuterol neb 2.5 mg every 4 hours as needed    01/25/2015  f/u ov/Christine Pierce re:  Sob ? Etiology  Chief Complaint  Patient presents with  . Follow-up    Pt increased DOE for the past 6 months. She states she occ gets SOB just walking down a short hallway.   restarted walk and then noted sob x hallway x 29ft  No noct cough  Or noct resp issues at all    No obvious day to day or daytime variabilty or assoc  cp or chest tightness, subjective wheeze overt sinus or hb symptoms. No unusual exp hx or h/o childhood pna/ asthma or knowledge of premature birth.  Sleeping ok without nocturnal  or early am exacerbation  of respiratory  c/o's or need for  noct saba. Also denies any obvious fluctuation of symptoms with weather or environmental changes or other aggravating or alleviating factors except as outlined above   Current Medications, Allergies, Complete Past Medical History, Past Surgical History, Family History, and Social History were reviewed in [facility] electronic medical record.  ROS  The following are not active complaints unless bolded sore throat, dysphagia, dental problems, itching, sneezing,  nasal congestion or excess/ purulent secretions, ear ache,   fever, chills, sweats, unintended wt loss, pleuritic or exertional cp, hemoptysis,  orthopnea pnd or leg swelling, presyncope, palpitations, heartburn, abdominal pain, anorexia, nausea, vomiting, diarrhea  or change in bowel or urinary habits, change in stools or urine, dysuria,hematuria,  rash, arthralgias, visual complaints, headache, numbness weakness or  ataxia or problems with walking or coordination,  change in mood/affect or memory.             Objective:   Physical Exam   01/25/2015      168    12/01/13 170 lb (77.111 kg)  10/21/13 176 lb (79.833 kg)  10/16/13 177 lb 6.4 oz (80.468 kg)      Hoarse amb wf with classic voice fatigue  HEENT: nl dentition, turbinates, and orophanx. Nl external ear canals without cough reflex   NECK :  without JVD/Nodes/TM/ nl carotid upstrokes bilaterally   LUNGS: no acc muscle use, clear to A and P bilaterally without cough on insp or exp maneuvers   CV:  RRR  no s3 G II/ VI SEM - no increase in P2, no edema   ABD:  soft and nontender with nl excursion in the supine position. No bruits or organomegaly, bowel sounds nl  MS:  warm without deformities, calf tenderness, cyanosis or clubbing  SKIN: warm and dry without lesions    NEURO:  alert, approp, no deficits         CXR PA and Lateral:   01/25/2015 :    I personally reviewed images and agree with radiology impression as follows:    COPD without acute abnormality.   Labs ordered/ reviewed:      Chemistry      Component Value Date/Time   NA 138 01/25/2015 1039   NA 140 11/16/2014 1033   K 4.7 01/25/2015 1039   CL 106 01/25/2015 1039   CO2 19 01/25/2015 1039   BUN 32* 01/25/2015 1039   BUN 27 11/16/2014 1033   CREATININE 1.16 01/25/2015 1039      Component Value Date/Time   CALCIUM 10.8* 01/25/2015 1039   ALKPHOS 78 11/16/2014 1033   AST 10 11/16/2014 1033   ALT 5 11/16/2014 1033   BILITOT 0.4 11/16/2014 1033   BILITOT 0.7 02/27/2014 1353        Lab Results  Component Value Date   WBC 9.4 01/25/2015   HGB 12.8 01/25/2015   HCT 39.6 01/25/2015   MCV 92.2 01/25/2015   PLT 217.0 01/25/2015      Lab Results  Component Value Date   TSH 2.02 01/25/2015     Lab Results  Component Value Date   PROBNP 68.0 01/25/2015                 Assessment & Plan:

## 2015-01-26 DIAGNOSIS — M545 Low back pain: Secondary | ICD-10-CM | POA: Diagnosis not present

## 2015-01-26 DIAGNOSIS — R278 Other lack of coordination: Secondary | ICD-10-CM | POA: Diagnosis not present

## 2015-01-26 DIAGNOSIS — N393 Stress incontinence (female) (male): Secondary | ICD-10-CM | POA: Diagnosis not present

## 2015-01-26 DIAGNOSIS — R2681 Unsteadiness on feet: Secondary | ICD-10-CM | POA: Diagnosis not present

## 2015-01-26 DIAGNOSIS — Z9181 History of falling: Secondary | ICD-10-CM | POA: Diagnosis not present

## 2015-01-26 DIAGNOSIS — M6281 Muscle weakness (generalized): Secondary | ICD-10-CM | POA: Diagnosis not present

## 2015-01-26 LAB — CBC WITH DIFFERENTIAL/PLATELET
Basophils Absolute: 0 10*3/uL (ref 0.0–0.1)
Basophils Relative: 0.4 % (ref 0.0–3.0)
EOS PCT: 2.4 % (ref 0.0–5.0)
Eosinophils Absolute: 0.2 10*3/uL (ref 0.0–0.7)
HCT: 39.6 % (ref 36.0–46.0)
Hemoglobin: 12.8 g/dL (ref 12.0–15.0)
LYMPHS ABS: 2 10*3/uL (ref 0.7–4.0)
Lymphocytes Relative: 21.2 % (ref 12.0–46.0)
MCHC: 32.2 g/dL (ref 30.0–36.0)
MCV: 92.2 fl (ref 78.0–100.0)
MONO ABS: 0.6 10*3/uL (ref 0.1–1.0)
Monocytes Relative: 6.1 % (ref 3.0–12.0)
NEUTROS ABS: 6.5 10*3/uL (ref 1.4–7.7)
NEUTROS PCT: 69.9 % (ref 43.0–77.0)
PLATELETS: 217 10*3/uL (ref 150.0–400.0)
RBC: 4.29 Mil/uL (ref 3.87–5.11)
RDW: 17.6 % — ABNORMAL HIGH (ref 11.5–15.5)
WBC: 9.4 10*3/uL (ref 4.0–10.5)

## 2015-01-27 DIAGNOSIS — M545 Low back pain: Secondary | ICD-10-CM | POA: Diagnosis not present

## 2015-01-27 DIAGNOSIS — N393 Stress incontinence (female) (male): Secondary | ICD-10-CM | POA: Diagnosis not present

## 2015-01-27 DIAGNOSIS — R278 Other lack of coordination: Secondary | ICD-10-CM | POA: Diagnosis not present

## 2015-01-27 DIAGNOSIS — R2681 Unsteadiness on feet: Secondary | ICD-10-CM | POA: Diagnosis not present

## 2015-01-27 DIAGNOSIS — Z9181 History of falling: Secondary | ICD-10-CM | POA: Diagnosis not present

## 2015-01-27 DIAGNOSIS — M6281 Muscle weakness (generalized): Secondary | ICD-10-CM | POA: Diagnosis not present

## 2015-01-27 NOTE — Progress Notes (Signed)
Quick Note:  Called and spoke to pt's HCPOA, Clarisse. Informed her of the results and recs per MW. Pt verbalized understanding and denied any further questions or concerns at this time.   ______

## 2015-01-29 ENCOUNTER — Encounter: Payer: Self-pay | Admitting: Internal Medicine

## 2015-01-29 DIAGNOSIS — R278 Other lack of coordination: Secondary | ICD-10-CM | POA: Diagnosis not present

## 2015-01-29 DIAGNOSIS — N393 Stress incontinence (female) (male): Secondary | ICD-10-CM | POA: Diagnosis not present

## 2015-01-29 DIAGNOSIS — M545 Low back pain: Secondary | ICD-10-CM | POA: Diagnosis not present

## 2015-01-29 DIAGNOSIS — R2681 Unsteadiness on feet: Secondary | ICD-10-CM | POA: Diagnosis not present

## 2015-01-29 DIAGNOSIS — Z9181 History of falling: Secondary | ICD-10-CM | POA: Diagnosis not present

## 2015-01-29 DIAGNOSIS — M6281 Muscle weakness (generalized): Secondary | ICD-10-CM | POA: Diagnosis not present

## 2015-01-29 NOTE — Assessment & Plan Note (Addendum)
-   12/02/14 PFTs non physiologic - 01/25/2015  Walked RA x 3 laps @ 185 ft each stopped due to  End of study, nl pace, no   desat  - mild sob   Symptoms are markedly disproportionate to objective findings and not clear this is a lung problem but pt does appear to have difficult airway management issues. DDX of  difficult airways management all start with A and  include Adherence, Ace Inhibitors, Acid Reflux, Active Sinus Disease, Alpha 1 Antitripsin deficiency, Anxiety masquerading as Airways dz,  ABPA,  allergy(esp in young), Aspiration (esp in elderly), Adverse effects of meds,  Active smokers, A bunch of PE's (a small clot burden can't cause this syndrome unless there is already severe underlying pulm or vascular dz with poor reserve) plus two Bs  = Bronchiectasis and Beta blocker use..and one C= CHF  ? Acid (or non-acid) GERD > always difficult to exclude as up to 75% of pts in some series report no assoc GI/ Heartburn symptoms> rec max (24h)  acid suppression and diet restrictions/ reviewed and instructions given in writing.   ? Anxiety related to deconditioning > dx of exclusion but near to top of the list   ? chf > excluded with bnp << 100   Assoc with severe hoarseness noted > ent eval next if not improving with gerd rx  I had an extended discussion with the patient reviewing all relevant studies completed to date and  lasting 15 to 20 minutes of a 25 minute visit    Each maintenance medication was reviewed in detail including most importantly the difference between maintenance and prns and under what circumstances the prns are to be triggered using an action plan format that is not reflected in the computer generated alphabetically organized AVS.    Please see instructions for details which were reviewed in writing and the patient given a copy highlighting the part that I personally wrote and discussed at today's ov.

## 2015-02-01 DIAGNOSIS — R278 Other lack of coordination: Secondary | ICD-10-CM | POA: Diagnosis not present

## 2015-02-01 DIAGNOSIS — R2681 Unsteadiness on feet: Secondary | ICD-10-CM | POA: Diagnosis not present

## 2015-02-01 DIAGNOSIS — M6281 Muscle weakness (generalized): Secondary | ICD-10-CM | POA: Diagnosis not present

## 2015-02-01 DIAGNOSIS — Z9181 History of falling: Secondary | ICD-10-CM | POA: Diagnosis not present

## 2015-02-01 DIAGNOSIS — N393 Stress incontinence (female) (male): Secondary | ICD-10-CM | POA: Diagnosis not present

## 2015-02-01 DIAGNOSIS — M545 Low back pain: Secondary | ICD-10-CM | POA: Diagnosis not present

## 2015-02-02 DIAGNOSIS — R2681 Unsteadiness on feet: Secondary | ICD-10-CM | POA: Diagnosis not present

## 2015-02-02 DIAGNOSIS — Z9181 History of falling: Secondary | ICD-10-CM | POA: Diagnosis not present

## 2015-02-02 DIAGNOSIS — N393 Stress incontinence (female) (male): Secondary | ICD-10-CM | POA: Diagnosis not present

## 2015-02-02 DIAGNOSIS — M6281 Muscle weakness (generalized): Secondary | ICD-10-CM | POA: Diagnosis not present

## 2015-02-02 DIAGNOSIS — R278 Other lack of coordination: Secondary | ICD-10-CM | POA: Diagnosis not present

## 2015-02-02 DIAGNOSIS — M545 Low back pain: Secondary | ICD-10-CM | POA: Diagnosis not present

## 2015-02-03 DIAGNOSIS — R278 Other lack of coordination: Secondary | ICD-10-CM | POA: Diagnosis not present

## 2015-02-03 DIAGNOSIS — R2681 Unsteadiness on feet: Secondary | ICD-10-CM | POA: Diagnosis not present

## 2015-02-03 DIAGNOSIS — Z9181 History of falling: Secondary | ICD-10-CM | POA: Diagnosis not present

## 2015-02-03 DIAGNOSIS — M6281 Muscle weakness (generalized): Secondary | ICD-10-CM | POA: Diagnosis not present

## 2015-02-03 DIAGNOSIS — N393 Stress incontinence (female) (male): Secondary | ICD-10-CM | POA: Diagnosis not present

## 2015-02-03 DIAGNOSIS — M545 Low back pain: Secondary | ICD-10-CM | POA: Diagnosis not present

## 2015-02-04 ENCOUNTER — Ambulatory Visit (INDEPENDENT_AMBULATORY_CARE_PROVIDER_SITE_OTHER): Payer: Medicare Other | Admitting: Nurse Practitioner

## 2015-02-04 ENCOUNTER — Encounter: Payer: Self-pay | Admitting: Nurse Practitioner

## 2015-02-04 VITALS — BP 140/76 | HR 81 | Temp 97.6°F | Resp 20 | Ht 67.0 in | Wt 171.4 lb

## 2015-02-04 DIAGNOSIS — I10 Essential (primary) hypertension: Secondary | ICD-10-CM | POA: Diagnosis not present

## 2015-02-04 DIAGNOSIS — Z9181 History of falling: Secondary | ICD-10-CM | POA: Diagnosis not present

## 2015-02-04 DIAGNOSIS — N393 Stress incontinence (female) (male): Secondary | ICD-10-CM | POA: Diagnosis not present

## 2015-02-04 DIAGNOSIS — M549 Dorsalgia, unspecified: Secondary | ICD-10-CM | POA: Diagnosis not present

## 2015-02-04 DIAGNOSIS — R278 Other lack of coordination: Secondary | ICD-10-CM | POA: Diagnosis not present

## 2015-02-04 DIAGNOSIS — M6281 Muscle weakness (generalized): Secondary | ICD-10-CM | POA: Diagnosis not present

## 2015-02-04 DIAGNOSIS — R2681 Unsteadiness on feet: Secondary | ICD-10-CM | POA: Diagnosis not present

## 2015-02-04 DIAGNOSIS — F411 Generalized anxiety disorder: Secondary | ICD-10-CM

## 2015-02-04 DIAGNOSIS — M545 Low back pain: Secondary | ICD-10-CM | POA: Diagnosis not present

## 2015-02-04 NOTE — Progress Notes (Signed)
Patient ID: Christine Pierce, female   DOB: [birth year], 81 y.o.   MRN: [id number]    PCP: Kimber Relic, MD  Advanced Directive information Does patient have an advance directive?: Yes  Allergies  Allergen Reactions  . Macrodantin [Nitrofurantoin Macrocrystal]     Affects kidneys  . Proventil [Albuterol]     Patient unable to coordinate use of inhaler    Chief Complaint  Patient presents with  . Acute Visit    having back pain and trouble with b/p  . OTHER    friend in room with patient     HPI: Patient is a 81 y.o. female seen in the office today due to medication questions. Here today with friend who has most of the questions.  Our list do not have fluvoxamine but she is still receiving this at the facility her caregiver has done a lot of research and believes this is interacting with her other medication and would like to have this discontinued. Had dizzy episode when working with PT/OT and blood pressure was ~ 108/60 feels like this is associated  Pt also taking losartan 50 mg daily which is not on her medication list.   Have not been taking Protonix and Pepcid prescribed by Dr Sherene Sires due to hoariness but caregiver reports these were taking these in the past but they were stopped in the past, unsure of why.   Needs another referral for epidural injection due to back pain at [facility].  In the past she has had to do a series of injections prior to benefit and needs referral for each injection. Pain at L2, no numbess or tingling noted to the lower extremities.   Review of Systems:  Review of Systems  Constitutional: Positive for fatigue. Negative for fever, chills, diaphoresis, activity change, appetite change and unexpected weight change.  HENT: Positive for hearing loss.   Eyes: Negative.   Respiratory: Positive for cough. Negative for shortness of breath.        Ongoing hoarseness.   Cardiovascular: Positive for leg swelling. Negative for palpitations.    Endocrine:       Elevated blood sugars. Currently on "dietary control". She is not following her diet closely.  Musculoskeletal: Positive for myalgias, arthralgias and gait problem.       Generalized weakness: Pain in the right hip area and low back.   Skin: Negative.   Neurological: Positive for weakness. Negative for dizziness.       Memory loss.  Hematological: Negative.   Psychiatric/Behavioral: Positive for confusion. The patient is nervous/anxious.     Past Medical History  Diagnosis Date  . Hypertension 10/23/2011  . Cystocele, midline 04/10/2012  . Cough 04/10/2012  . Full incontinence of feces 04/10/2012  . Urgency of urination 04/10/2012  . Abdominal pain, other specified site   . Orthopnea 12/01/2011  . Other and unspecified hyperlipidemia 10/20/2011  . Urinary tract infection, site not specified 09/11/2011  . Muscle weakness (generalized) 09/11/2011  . Syncope and collapse 03/29/2011  . Pain in joint, ankle and foot 12/27/2010  . Sprain of ribs 08/15/2010  . Unspecified tinnitus 07/04/2010  . Pneumonia, organism unspecified 05/04/2010  . Diverticulosis of colon (without mention of hemorrhage)   . Other and unspecified ovarian cyst 05/04/2010  . Abdominal pain, right lower quadrant 05/02/2010  . Anxiety state, unspecified 02/14/2010  . Shortness of breath 01/05/2010  . Pain in joint, shoulder region 11/15/2009  . Pain in joint, pelvic region and thigh 07/29/2009  . Personal  history of fall 07/29/2009  . Other malaise and fatigue 02/15/2009  . Obesity, unspecified 07/16/2008  . Unspecified hereditary and idiopathic peripheral neuropathy   . Reflux esophagitis 10/18/2007  . Unspecified vitamin D deficiency 07/19/2007  . Chest pain, unspecified 07/19/2007  . Varicose veins of lower extremities with inflammation 04/25/2006  . Restless legs syndrome (RLS) 01/16/2006  . Nocturia 07/07/2005  . Cervicalgia 01/20/2004  . Osteoporosis, unspecified 01/23/2003  .  Sciatica 04/03/2001  . Pain in joint, lower leg 12/11/2001  . Lumbago 02/20/2001  . Hard of hearing     both ears  . Chronic kidney disease, stage III (moderate) 12/15/09  . Acute kidney failure, unspecified (HCC) 12/10/2009  . Hydronephrosis 07/16/2008  . Anxiety   . Depression   . Abnormality of gait 12/17/2012  . Compression fracture of L2 (HCC)   . COPD (chronic obstructive pulmonary disease) (HCC) 03/26/2013  . Diabetes mellitus with renal manifestations, controlled (HCC) 12/17/2013  . Dysphagia, pharyngoesophageal phase 07/22/2013  . Hyperlipidemia 04/28/2014  . Memory deficits 05/08/2013  . Abnormal CT scan of lung 09/04/2012    Linear area of increased density at the posterior aspect of the superior segment left lower lobe appears slightly less prominent on the current exam suggesting atelectasis. No definite new or progressive findings are seen which are worrisome for tumor.   . ADHD (attention deficit hyperactivity disorder) 01/27/2014  . Anal fissure 10/21/2013  . Anxiety state 10/21/2013  . Back pain   . Cerebral atrophy 04/29/2014  . Cerebrovascular disease 04/29/2014  . Constipation 10/21/2013  . Fatigue 09/04/2012  . History of DVT (deep vein thrombosis) right leg 04/29/2014  . History of pulmonary embolism 02/13/2014  . Hoarding behavior 05/08/2013  . Pain in hip 10/22/2012  . Right hip pain 02/27/2014  . Tremor 10/21/2013    Bilateral fine quiver   . Upper airway cough syndrome 09/17/2013    Followed in Pulmonary clinic/ [facility]/ Wert  - hfa 0% 09/17/2013 so try off all inhalers and change losartan to alternative arb > no worse off all inhalers 12/01/2013    . UTI (urinary tract infection) 09/04/2012  . Varicose veins 07/22/2013   Past Surgical History  Procedure Laterality Date  . Appendectomy    . Stapedectomy Bilateral [phone]    middle ear surgery  . Eye surgery Left 1988    cataract w IOL implant, Dr. Vic RipperPerlman  . Fracture surgery Right 09/2004    distal  radius/Dr. Teressa SenterSypher  . Tonsillectomy and adenoidectomy  age 89  . Esophagogastroduodenoscopy (egd) with propofol N/A 09/03/2013    Procedure: ESOPHAGOGASTRODUODENOSCOPY (EGD) WITH PROPOFOL;  Surgeon: Willis ModenaWilliam Outlaw, MD;  Location: [facility] ENDOSCOPY;  Service: Endoscopy;  Laterality: N/A;  . Botox injection N/A 09/03/2013    Procedure: BOTOX INJECTION;  Surgeon: Willis ModenaWilliam Outlaw, MD;  Location: [facility] ENDOSCOPY;  Service: Endoscopy;  Laterality: N/A;  . Vertebroplasty  02/13/14   Social History:   reports that she has never smoked. She has never used smokeless tobacco. She reports that she does not drink alcohol or use illicit drugs.  Family History  Problem Relation Age of Onset  . Cancer Mother     abdomen  . Cancer Father     brain, lung  . Heart disease Son     Medications: Patient's Medications  New Prescriptions   No medications on file  Previous Medications   ACETAMINOPHEN (TYLENOL ARTHRITIS PAIN) 650 MG CR TABLET    Take 650 mg by mouth 3 (three) times daily. For arthritis  pain   ACETAMINOPHEN (TYLENOL) 500 MG TABLET    Take 500 mg by mouth every 4 (four) hours as needed (pain).    AMLODIPINE (NORVASC) 10 MG TABLET    Take 1 tablet (10 mg total) by mouth daily.   ASPIRIN EC 81 MG TABLET    One daily for anticoagulation   CALCITONIN, SALMON, (MIACALCIN/FORTICAL) 200 UNIT/ACT NASAL SPRAY    Place 1 spray into alternate nostrils every other day.    FAMOTIDINE (PEPCID) 20 MG TABLET    One at bedtime   MIRABEGRON ER (MYRBETRIQ) 25 MG TB24 TABLET    One daily to help bladder control   NITROGLYCERIN (NITROSTAT) 0.4 MG SL TABLET    Place 0.4 mg under the tongue every 5 (five) minutes as needed for chest pain.   PANTOPRAZOLE (PROTONIX) 40 MG TABLET    Take 1 tablet (40 mg total) by mouth daily. Take 30-60 min before first meal of the day   PHENOL (CHLORASEPTIC) 1.4 % LIQD    Use as directed 1 spray in the mouth or throat as needed for throat irritation / pain.   SENNA-DOCUSATE (SENOKOT-S) 8.6-50 MG  PER TABLET    One at bed to prevent constipation  Modified Medications   No medications on file  Discontinued Medications   No medications on file     Physical Exam:  Filed Vitals:   02/04/15 1443  BP: 140/76  Pulse: 81  Temp: 97.6 F (36.4 C)  TempSrc: Oral  Resp: 20  Height:  (1.702 m)  Weight: 171 lb 6.4 oz (77.747 kg)  SpO2: 96%   Body mass index is 26.84 kg/(m^2).  Physical Exam  Constitutional: She appears well-developed and well-nourished. No distress.  HENT:  Head: Normocephalic and atraumatic.  Eyes: Conjunctivae and EOM are normal. Pupils are equal, round, and reactive to light.  Neck: Normal range of motion. Neck supple.  Cardiovascular: Normal rate, regular rhythm and normal heart sounds.   Pulmonary/Chest: Effort normal and breath sounds normal. No respiratory distress.  Abdominal: Soft. Bowel sounds are normal. She exhibits no distension.  Musculoskeletal: She exhibits edema (trace) and tenderness (lumbar spine).  Neurological: She is alert.  Skin: Skin is warm and dry. She is not diaphoretic.    Labs reviewed: Basic Metabolic Panel:  Recent Labs  16/10/96 1516 11/16/14 1033 01/25/15 1039  NA 140 140 138  K 5.2 4.9 4.7  CL 102 104 106  CO2 GLUCOSE 97 94 100*  BUN 27 27 32*  CREATININE 1.29* 1.22* 1.16  CALCIUM 10.2 10.6* 10.8*  TSH  --   --  2.02   Liver Function Tests:  Recent Labs  02/27/14 1353 09/15/14 1516 11/16/14 1033  AST ALT ALKPHOS 72 86 78  BILITOT 0.7 0.3 0.4  PROT 5.4* 6.3 6.8  ALBUMIN 3.0* 4.2 4.5   No results for input(s): LIPASE, AMYLASE in the last 8760 hours. No results for input(s): AMMONIA in the last 8760 hours. CBC:  Recent Labs  02/27/14 1353 02/28/14 0155 03/01/14 0409 11/16/14 1033 01/25/15 1039  WBC 13.9* 11.1* 13.2* 5.4 9.4  NEUTROABS 10.5*  --   --  3.0 6.5  HGB 11.1* 11.1* 10.8*  --  12.8  HCT 33.9* 34.6* 33.9* 37.0 39.6  MCV 91.4 92.8 93.4  --  92.2  PLT  198 184 227  --  217.0   Lipid Panel:  Recent Labs  09/15/14 1516  CHOL 145  HDL 71  LDLCALC 57  TRIG 85  CHOLHDL 2.0   TSH:  Recent Labs  01/25/15 1039  TSH 2.02   A1C: Lab Results  Component Value Date   HGBA1C 6.0* 09/15/2014     Assessment/Plan 1. Essential hypertension -Blood pressure stable, changing positions slowly to minimize orthostatic   2. Back pain, unspecified location - Ambulatory referral to Interventional Radiology for epidural injection.   3. Anxiety state -will stop fluvoxamine, monitor anxiety and mood  To keep follow up with Dr Michaelene Song. Biagio Borg  [facility] [phone] 8 am - 5 pm) [phone] (after hours)

## 2015-02-04 NOTE — Patient Instructions (Signed)
Stop fluvoxamine at this time  Start on protonix and pepcid as prescribed by Dr Sherene SiresWert  Blood pressure check with pulse 3 times a week and bring log to next appt with Dr Stephannie Lireen

## 2015-02-09 ENCOUNTER — Other Ambulatory Visit: Payer: Self-pay | Admitting: Nurse Practitioner

## 2015-02-09 DIAGNOSIS — G8929 Other chronic pain: Secondary | ICD-10-CM

## 2015-02-09 DIAGNOSIS — M545 Low back pain: Principal | ICD-10-CM

## 2015-02-19 ENCOUNTER — Ambulatory Visit
Admission: RE | Admit: 2015-02-19 | Discharge: 2015-02-19 | Disposition: A | Discharge status: Home / Self Care | Payer: Medicare Other | Source: Ambulatory Visit | Attending: Nurse Practitioner | Admitting: Nurse Practitioner

## 2015-02-19 DIAGNOSIS — M545 Low back pain: Principal | ICD-10-CM

## 2015-02-19 DIAGNOSIS — G8929 Other chronic pain: Secondary | ICD-10-CM

## 2015-02-19 MED ORDER — METHYLPREDNISOLONE ACETATE 40 MG/ML INJ SUSP (RADIOLOG
120.0000 mg | Freq: Once | INTRAMUSCULAR | Status: AC
  Administered 2015-02-19: 120 mg via EPIDURAL

## 2015-02-19 MED ORDER — IOHEXOL 180 MG/ML  SOLN
1.0000 mL | Freq: Once | INTRAMUSCULAR | Status: AC | PRN
Start: 2015-02-19 — End: 2015-02-19
  Administered 2015-02-19: 1 mL via EPIDURAL

## 2015-02-19 NOTE — Discharge Instructions (Addendum)

## 2015-02-23 ENCOUNTER — Ambulatory Visit: Payer: Medicare Other | Admitting: Internal Medicine

## 2015-03-08 DIAGNOSIS — Z9181 History of falling: Secondary | ICD-10-CM | POA: Diagnosis not present

## 2015-03-08 DIAGNOSIS — R278 Other lack of coordination: Secondary | ICD-10-CM | POA: Diagnosis not present

## 2015-03-08 DIAGNOSIS — M6281 Muscle weakness (generalized): Secondary | ICD-10-CM | POA: Diagnosis not present

## 2015-03-08 DIAGNOSIS — M545 Low back pain: Secondary | ICD-10-CM | POA: Diagnosis not present

## 2015-03-08 DIAGNOSIS — R2681 Unsteadiness on feet: Secondary | ICD-10-CM | POA: Diagnosis not present

## 2015-03-08 DIAGNOSIS — N393 Stress incontinence (female) (male): Secondary | ICD-10-CM | POA: Diagnosis not present

## 2015-03-09 ENCOUNTER — Ambulatory Visit: Payer: Medicare Other | Admitting: Internal Medicine

## 2015-03-09 DIAGNOSIS — N393 Stress incontinence (female) (male): Secondary | ICD-10-CM | POA: Diagnosis not present

## 2015-03-09 DIAGNOSIS — R278 Other lack of coordination: Secondary | ICD-10-CM | POA: Diagnosis not present

## 2015-03-09 DIAGNOSIS — R2681 Unsteadiness on feet: Secondary | ICD-10-CM | POA: Diagnosis not present

## 2015-03-09 DIAGNOSIS — M6281 Muscle weakness (generalized): Secondary | ICD-10-CM | POA: Diagnosis not present

## 2015-03-09 DIAGNOSIS — Z9181 History of falling: Secondary | ICD-10-CM | POA: Diagnosis not present

## 2015-03-09 DIAGNOSIS — M545 Low back pain: Secondary | ICD-10-CM | POA: Diagnosis not present

## 2015-03-10 ENCOUNTER — Encounter: Payer: Self-pay | Admitting: Internal Medicine

## 2015-03-10 ENCOUNTER — Ambulatory Visit (INDEPENDENT_AMBULATORY_CARE_PROVIDER_SITE_OTHER): Payer: Medicare Other | Admitting: Internal Medicine

## 2015-03-10 VITALS — BP 120/60 | HR 79 | Temp 97.8°F | Resp 20 | Ht 67.0 in | Wt 170.4 lb

## 2015-03-10 DIAGNOSIS — R1032 Left lower quadrant pain: Secondary | ICD-10-CM | POA: Diagnosis not present

## 2015-03-10 DIAGNOSIS — J449 Chronic obstructive pulmonary disease, unspecified: Secondary | ICD-10-CM

## 2015-03-10 DIAGNOSIS — M5442 Lumbago with sciatica, left side: Secondary | ICD-10-CM | POA: Diagnosis not present

## 2015-03-10 DIAGNOSIS — F411 Generalized anxiety disorder: Secondary | ICD-10-CM

## 2015-03-10 DIAGNOSIS — N393 Stress incontinence (female) (male): Secondary | ICD-10-CM

## 2015-03-10 DIAGNOSIS — M6281 Muscle weakness (generalized): Secondary | ICD-10-CM | POA: Diagnosis not present

## 2015-03-10 DIAGNOSIS — R278 Other lack of coordination: Secondary | ICD-10-CM | POA: Diagnosis not present

## 2015-03-10 DIAGNOSIS — H9202 Otalgia, left ear: Secondary | ICD-10-CM | POA: Diagnosis not present

## 2015-03-10 DIAGNOSIS — R05 Cough: Secondary | ICD-10-CM | POA: Diagnosis not present

## 2015-03-10 DIAGNOSIS — R059 Cough, unspecified: Secondary | ICD-10-CM | POA: Insufficient documentation

## 2015-03-10 DIAGNOSIS — I10 Essential (primary) hypertension: Secondary | ICD-10-CM

## 2015-03-10 DIAGNOSIS — M545 Low back pain: Secondary | ICD-10-CM | POA: Diagnosis not present

## 2015-03-10 DIAGNOSIS — F429 Obsessive-compulsive disorder, unspecified: Secondary | ICD-10-CM | POA: Diagnosis not present

## 2015-03-10 DIAGNOSIS — R32 Unspecified urinary incontinence: Secondary | ICD-10-CM

## 2015-03-10 DIAGNOSIS — F4323 Adjustment disorder with mixed anxiety and depressed mood: Secondary | ICD-10-CM

## 2015-03-10 DIAGNOSIS — R2681 Unsteadiness on feet: Secondary | ICD-10-CM | POA: Diagnosis not present

## 2015-03-10 DIAGNOSIS — Z9181 History of falling: Secondary | ICD-10-CM | POA: Diagnosis not present

## 2015-03-10 MED ORDER — LIDOCAINE 4 % EX PTCH: 1.0000 | MEDICATED_PATCH | Freq: Every morning | CUTANEOUS | Status: AC

## 2015-03-10 MED ORDER — PANTOPRAZOLE SODIUM 40 MG PO TBEC: 40.0000 mg | DELAYED_RELEASE_TABLET | Freq: Every day | ORAL | Status: AC

## 2015-03-10 MED ORDER — FLUVOXAMINE MALEATE 100 MG PO TABS: ORAL_TABLET | ORAL | Status: DC

## 2015-03-10 NOTE — Progress Notes (Signed)
Patient ID: Christine Pierce, female   DOB: 21-May-1921, 82 y.o.   MRN: [id number]    Facility  [facility]    Place of Service:   OFFICE    Allergies  Allergen Reactions  . Macrodantin [Nitrofurantoin Macrocrystal] Other (See Comments)    Affects kidneys  . Proventil [Albuterol] Other (See Comments)    Patient unable to coordinate use of inhaler    Chief Complaint  Patient presents with  . Medical Management of Chronic Issues    3 month follow-up for Hypertension  . OTHER    Patient c/o She is having pain in her legs and shoulders and also has pain in left lower quad  and has ear pain in left ear   . OTHER    Friend in room with patient    HPI:  Seen by Sherrie Mustache, NP on 02/04/2015. Fluoxetine was discontinued, she was referred for another injection in the back, and there was discussion about continuing Protonix.  My last visit was 11/18/2014. Diagnoses considered include diabetes mellitus with renal manifestations, HTN,  pharyngeal esophageal phase dysphagia, hyperlipidemia,. Left-sided back pain with left-sided sciatica, chronic anticoagulation with Xarelto for history of DVT and pulmonary embolism, anxiety, OCD. Xarelto was discontinued and aspirin was substituted. Fluvoxamine was increased to 100 mg, but has been discontinued since then. Epidural injection for left-sided low back pain was scheduled.  Complains of pains in her legs. Pains in the arms.   Has a cough for the last 4 days. Was started on an antibiotic. Bartholome Bill a headache since the cough developed. No fever. Has noted wheezing.  Wearing a hearing aid bilatrally. Pain in the left ear.  Went to Dr. Tonita Cong for her back pains.   Urinary incontinence and urgency. Worse at night. Has seen Dr. Jimmy Footman, nephrologist. Has not seen urologist.   Has been working with PT for safe ambulation with walker.  Says she is feeling somewhat depressed.    Medications: Patient's Medications  New Prescriptions   No medications on  file  Previous Medications   ACETAMINOPHEN (TYLENOL ARTHRITIS PAIN) 650 MG CR TABLET    Take 650 mg by mouth 3 (three) times daily. For arthritis pain   ACETAMINOPHEN (TYLENOL) 500 MG TABLET    Take 500 mg by mouth every 4 (four) hours as needed (pain).    AMLODIPINE (NORVASC) 10 MG TABLET    Take 1 tablet (10 mg total) by mouth daily.   ASPIRIN EC 81 MG TABLET    One daily for anticoagulation   CALCITONIN, SALMON, (MIACALCIN/FORTICAL) 200 UNIT/ACT NASAL SPRAY    Place 1 spray into alternate nostrils every other day.    FAMOTIDINE (PEPCID) 20 MG TABLET    One at bedtime   LOSARTAN (COZAAR) 50 MG TABLET    Take 50 mg by mouth daily.   MELATONIN 3 MG CAPS    Take 3 mg by mouth daily.   MIRABEGRON ER (MYRBETRIQ) 25 MG TB24 TABLET    One daily to help bladder control   NITROGLYCERIN (NITROSTAT) 0.4 MG SL TABLET    Place 0.4 mg under the tongue every 5 (five) minutes as needed for chest pain.   PANTOPRAZOLE (PROTONIX) 40 MG TABLET    Take 1 tablet (40 mg total) by mouth daily. Take 30-60 min before first meal of the day   PHENOL (CHLORASEPTIC) 1.4 % LIQD    Use as directed 1 spray in the mouth or throat as needed for throat irritation / pain.   SENNA-DOCUSATE (SENOKOT-S)  8.6-50 MG PER TABLET    One at bed to prevent constipation  Modified Medications   No medications on file  Discontinued Medications   No medications on file    Review of Systems  Constitutional: Positive for fatigue. Negative for fever, chills, diaphoresis, activity change, appetite change and unexpected weight change.  HENT: Positive for hearing loss.   Eyes: Negative.   Respiratory: Positive for cough and shortness of breath.        Complains of a hoarse voice for the last few months. There is no pain.  Cardiovascular: Positive for leg swelling. Negative for palpitations.  Gastrointestinal: Positive for constipation and rectal pain.       Occasional fecal incontinence. Sometimes has difficulty swallowing. Solids and  liquids seem to be involved. There is no odynophagia.  Endocrine:       Elevated blood sugars. Currently on "dietary control". She is not following her diet closely.  Genitourinary: Positive for urgency and frequency. Negative for dysuria, decreased urine volume, vaginal bleeding, vaginal discharge, enuresis, genital sores, vaginal pain and pelvic pain.       UTI 02/17/14. Incontinent of urine.  Musculoskeletal:       Generalized weakness: Pain in the right hip area. Requires assistance in getting up and down. At risk for falls.  Skin: Negative.   Allergic/Immunologic: Negative.   Neurological: Positive for weakness.       Memory loss.  Hematological: Negative.   Psychiatric/Behavioral: Positive for confusion. The patient is nervous/anxious.        History of hoarding behavior.Past history of impulsive buying of products on television.    Filed Vitals:   03/10/15 1332  BP: 120/60  Pulse: 79  Temp: 97.8 F (36.6 C)  TempSrc: Oral  Resp: 20  Height: _0  (1.702 m)  Weight: 170 lb 6.4 oz (77.293 kg)  SpO2: 95%   Body mass index is 26.68 kg/(m^2). Filed Weights   03/10/15 1332  Weight: 170 lb 6.4 oz (77.293 kg)     Physical Exam  Constitutional: She is oriented to person, place, and time. No distress.  Overweight  HENT:  Head: Atraumatic.  Partial deafness. Uses hearing aids bilaterally. Complains of left ear pain.  Eyes:  Wears prescription lenses.  Neck: No JVD present. No tracheal deviation present. No thyromegaly present.  Some tenderness with movement laterally to either side.  Cardiovascular: Normal rate and regular rhythm.  Exam reveals no friction rub.   Murmur (3/6 SEM at LSB and aortic ejection) heard. Bilateral varicose veins  Pulmonary/Chest: No respiratory distress. She has no wheezes. She has no rales. She exhibits tenderness (left lower chest wall).  Tender left flank area lower rib margins laterally and slightly anteriorly. Voice is slightly hoarse.    Abdominal: Soft. Bowel sounds are normal. She exhibits no distension and no mass. There is no tenderness.  Genitourinary: Guaiac negative stool.  Anal fissure at 6 o'clock  Musculoskeletal: Normal range of motion. She exhibits no edema or tenderness.  Tender the greater trochanter and right groin. She is hobbled by this decrease in her stride length. Low back pains, more on the left side.  Lymphadenopathy:    She has no cervical adenopathy.  Neurological: She is alert and oriented to person, place, and time. No cranial nerve deficit. She exhibits normal muscle tone. Coordination normal.  04/30/12 MMSE 27/30. Passed clock drawing. Having some trouble in sequencing events and in telling a coherent story.  Insensitive to vibration in the feet.  Skin: No  rash noted. She is not diaphoretic. No erythema. No pallor.  Psychiatric: She has a normal mood and affect. Her behavior is normal. Judgment and thought content normal.    Labs reviewed: Lab Summary Latest Ref Rng 01/25/2015 11/16/2014 09/15/2014  Hemoglobin 12.0 - 15.0 g/dL 12.8 12.1 (None)  Hematocrit 36.0 - 46.0 % 39.6 37.0 (None)  White count 4.0 - 10.5 K/uL 9.4 5.4 (None)  Platelet count 150.0 - 400.0 K/uL 217.0 (None) (None)  Sodium 135 - 145 mEq/L 138 140 140  Potassium 3.5 - 5.1 mEq/L 4.7 4.9 5.2  Calcium 8.4 - 10.5 mg/dL 10.8(H) 10.6(H) 10.2  Phosphorus - (None) (None) (None)  Creatinine 0.40 - 1.20 mg/dL 1.16 1.22(H) 1.29(H)  AST 0 - 40 IU/L (None) 10 19  Alk Phos 39 - 117 IU/L (None) 78 86  Bilirubin 0.0 - 1.2 mg/dL (None) 0.4 0.3  Glucose 70 - 99 mg/dL 100(H) 94 97  Cholesterol - (None) (None) (None)  HDL cholesterol >39 mg/dL (None) (None) 71  Triglycerides 0 - 149 mg/dL (None) (None) 85  LDL Direct - (None) (None) (None)  LDL Calc 0 - 99 mg/dL (None) (None) 57  Total protein - (None) (None) (None)  Albumin 3.2 - 4.6 g/dL (None) 4.5 4.2   Lab Results  Component Value Date   TSH 2.02 01/25/2015   TSH 1.43 12/01/2013    TSH 1.150 03/26/2013   Lab Results  Component Value Date   BUN 32* 01/25/2015   BUN 27 11/16/2014   BUN 27 09/15/2014   Lab Results  Component Value Date   HGBA1C 6.0* 09/15/2014   HGBA1C 6.2* 01/19/2014   HGBA1C 6.1* 07/18/2013    Assessment/Plan  1. Essential hypertension Controlled  2. LLQ abdominal pain Chronic  3. Otalgia of left ear I'm unable see any signs of infection. There is a small amount of wax present, but no signs of inflammation. Hearing aid was cleaned and she was able to insert it easily.  4. Chronic obstructive pulmonary disease, unspecified COPD type ([place]) Patient has an acute respiratory infection with bronchial rattle and cough. She has been started on antibiotic that was called in and the records sent from her assisted living facility did not detail what the antibiotic is.  5. Adjustment disorder with mixed anxiety and depressed mood Resume previous dosing of fluvoxamine due to depression and obsessive characteristics surfacing again. - fluvoxaMINE (LUVOX) 100 MG tablet; One at bedtime to help nerves  Dispense: 30 tablet; Refill: 5  6. Anxiety state - fluvoxaMINE (LUVOX) 100 MG tablet; One at bedtime to help nerves  Dispense: 30 tablet; Refill: 5  7. Left-sided low back pain with left-sided sciatica - Lidocaine (ASPERCREME LIDOCAINE) 4 % PTCH; Apply 1 patch topically every morning. To the back to relieve pain  Dispense: 30 patch; Refill: 5  8. Obsessive compulsive disorder - fluvoxaMINE (LUVOX) 100 MG tablet; One at bedtime to help nerves  Dispense: 30 tablet; Refill: 5  9. Incontinence in female Continue Myrbetriq. If cultures are negative, consider referral to urologist. - Urinalysis - Urine culture  10. Cough Resume pantoprazole as recommended by Dr. Melvyn Novas. -Continue Pepcid each morning - pantoprazole (PROTONIX) 40 MG tablet; Take 1 tablet (40 mg total) by mouth daily. Take 30-60 min before first meal of the day  Dispense: 30 tablet; Refill:  2

## 2015-03-11 DIAGNOSIS — M6281 Muscle weakness (generalized): Secondary | ICD-10-CM | POA: Diagnosis not present

## 2015-03-11 DIAGNOSIS — R2681 Unsteadiness on feet: Secondary | ICD-10-CM | POA: Diagnosis not present

## 2015-03-11 DIAGNOSIS — M545 Low back pain: Secondary | ICD-10-CM | POA: Diagnosis not present

## 2015-03-11 DIAGNOSIS — Z9181 History of falling: Secondary | ICD-10-CM | POA: Diagnosis not present

## 2015-03-11 DIAGNOSIS — N393 Stress incontinence (female) (male): Secondary | ICD-10-CM | POA: Diagnosis not present

## 2015-03-11 DIAGNOSIS — R278 Other lack of coordination: Secondary | ICD-10-CM | POA: Diagnosis not present

## 2015-03-11 LAB — URINALYSIS
Bilirubin, UA: NEGATIVE
GLUCOSE, UA: NEGATIVE
Ketones, UA: NEGATIVE
Nitrite, UA: NEGATIVE
RBC UA: NEGATIVE
Specific Gravity, UA: 1.024 (ref 1.005–1.030)
UUROB: 0.2 mg/dL (ref 0.2–1.0)
pH, UA: 6 (ref 5.0–7.5)

## 2015-03-12 LAB — URINE CULTURE

## 2015-03-15 ENCOUNTER — Telehealth: Payer: Self-pay

## 2015-03-15 DIAGNOSIS — M6281 Muscle weakness (generalized): Secondary | ICD-10-CM | POA: Diagnosis not present

## 2015-03-15 DIAGNOSIS — M545 Low back pain: Secondary | ICD-10-CM | POA: Diagnosis not present

## 2015-03-15 DIAGNOSIS — R278 Other lack of coordination: Secondary | ICD-10-CM | POA: Diagnosis not present

## 2015-03-15 DIAGNOSIS — R2681 Unsteadiness on feet: Secondary | ICD-10-CM | POA: Diagnosis not present

## 2015-03-15 DIAGNOSIS — N393 Stress incontinence (female) (male): Secondary | ICD-10-CM | POA: Diagnosis not present

## 2015-03-15 DIAGNOSIS — Z9181 History of falling: Secondary | ICD-10-CM | POA: Diagnosis not present

## 2015-03-15 MED ORDER — SULFAMETHOXAZOLE-TRIMETHOPRIM 800-160 MG PO TABS: 1.0000 | ORAL_TABLET | Freq: Two times a day (BID) | ORAL | Status: DC

## 2015-03-15 NOTE — Telephone Encounter (Signed)
Discussed with patient.  

## 2015-03-15 NOTE — Telephone Encounter (Signed)
-----   Message from LamarMonica Carter, [place] sent at 03/12/2015 12:48 PM EST ----- (+) UTI - Bactrim ds #14 take 1 po BID no RF; take probiotic daily while on abx

## 2015-03-16 ENCOUNTER — Ambulatory Visit (INDEPENDENT_AMBULATORY_CARE_PROVIDER_SITE_OTHER): Payer: Medicare Other | Admitting: Internal Medicine

## 2015-03-16 ENCOUNTER — Encounter: Payer: Self-pay | Admitting: Internal Medicine

## 2015-03-16 VITALS — BP 130/84 | HR 80 | Temp 97.7°F | Ht 67.0 in | Wt 151.8 lb

## 2015-03-16 DIAGNOSIS — R05 Cough: Secondary | ICD-10-CM | POA: Diagnosis not present

## 2015-03-16 DIAGNOSIS — R058 Other specified cough: Secondary | ICD-10-CM

## 2015-03-16 NOTE — Patient Instructions (Addendum)
Add pepcid 20 mg one at bedtime  For cough > mucinex dm up to 1200 mg every 12 hours as needed   Finish the antibiotics (sulfa drug is ok for respiratory tract infections also)  Pantoprazole Take 30-60 min before first meal of the day    If you are satisfied with your treatment plan,  let your doctor know and he/she can either refill your medications or you can return here when your prescription runs out.     If in any way you are not 100% satisfied,  please tell us.  If 100% better, tell your friends!  Pulmonary follow up is as needed

## 2015-03-16 NOTE — Progress Notes (Signed)
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 Pulmonary Consult    Referred by Dr. Murray Hodgkins for SOB with exertion   no sob sitting still  Sleeps ok on 3 pillows or has bad HB Cough more day than night more dry than wet Swallowing better since botox injection one week prior to OV   ? Some better with hfa saba/ worse with dpi spiriva/advair  >>rec Stop spiriva, advair, and losartan Depomedrol 120 mg today  micardis 80/12.5 one daily  prilosec 40 Take 30- 60 min before your first and last meals of the day  GERD diet  Only use your albuterol (proair) as a rescue medication   Work on inhaler technique  If can't learn  Technique may need to use albuterol as needed per neb.    GERD diet    10/16/13 Follow up ov/ NP  Pt was seen for pulmonary consult 1 month for for DOE and cough .  Has known swallow issues.  She is a never smoker . She had a dx of COPD, last ov she was taken off of Advair and spiriva .  Losartan was changed to micardis hct .  Since last ov she is feeling better.  Reports DOE and hoarseness  75% improved since last ov..  Rec Remain on current regimen  Follow up Dr. Sherene Sires  In 3-4 weeks with PFT    12/02/2013 f/u ov/Laparis Durrett re: cough and sob / could not perform pfts Chief Complaint  Patient presents with  . Follow-up    Pt states that SOB and cough are unchanged. No new co's today.   300 hundred feet gives out, sob and fatigue about the same time, no worse off all inhalers  Cough now reported worse at hs since had botox for es stricture> GI f/u planned 12/02/13  rec Delsym cough syrup 2 tsp  every 12 hours as needed for cough instead of mucinex dm Add pepcid ac 20 mg and chlortrimeton 4 mg at bedtime to see if helps the night time cough (both are over the counter)  GERD diet   Pulmonary follow up is as needed - ENT evaluation may need to be considered if hoarseness persists despite diet and GI input (Dr Dulce Sellar)  In the event that you have attacks of breathing difficulty rec albuterol neb 2.5 mg every 4 hours as needed    01/25/2015  f/u ov/Jimmi Sidener re:  Sob ? Etiology  Chief Complaint  Patient presents with  . Follow-up    Pt increased DOE for the past 6 months. She states she occ gets SOB just walking down a short hallway.   restarted walk and then noted sob x hallway x 14ft  No noct cough  Or noct resp issues at all  rec Please remember to go to the lab and x-ray department downstairs for your tests - we will call you with the results when they are available.  Pantoprazole (protonix) 40 mg   Take  30-60 min before first meal of the day and Pepcid (famotidine)  20 mg one @  bedtime until return  to office  GERD  Diet  If voice not better after 6 weeks rec refer to ENT > did not go    03/16/2015  f/u ov/Priscella Donna re unexplained sob/ new cough  On mucinex/ bactrim/ no resp rx  Chief Complaint  Patient presents with  . Follow-up    Increased cough x 2-3 wks- non prod. Chest feels sore from coughing. Her breathing is unchanged.   cough worse day than noct, never started h2/ taking ppi 4 h prior to first meal/ no trouble swallowing    No obvious day to day or daytime variabilty or assoc  cp or chest tightness, subjective wheeze overt sinus or hb symptoms. No unusual exp hx or h/o childhood pna/ asthma or knowledge of premature birth.  Sleeping ok without nocturnal  or early am exacerbation  of respiratory  c/o's or need for noct saba. Also denies any obvious fluctuation of symptoms with weather or environmental changes or other aggravating or alleviating factors except as outlined above     Current Medications, Allergies, Complete Past Medical History, Past Surgical History, Family History, and Social History were reviewed in [facility] electronic medical record.  ROS  The following are not active complaints unless bolded sore throat, dysphagia, dental problems, itching, sneezing,  nasal congestion or excess/ purulent secretions, ear ache,   fever, chills, sweats, unintended wt loss, pleuritic or exertional cp, hemoptysis,  orthopnea pnd or leg swelling, presyncope, palpitations, heartburn, abdominal pain, anorexia, nausea, vomiting, diarrhea  or change in bowel or urinary habits, change in stools or urine, dysuria,hematuria,  rash, arthralgias, visual complaints, headache, numbness weakness or ataxia or problems with walking or coordination,  change in mood/affect or memory.             Objective:   Physical Exam  03/16/2015        152 01/25/2015      168    12/01/13 170 lb (77.111 kg)  10/21/13 176 lb (79.833 kg)  10/16/13 177 lb 6.4 oz (80.468 kg)      amb obese pleasant wf nad / minimally hoarse/ minimally congested sounding cough   HEENT: nl dentition, turbinates, and orophanx. Nl external ear canals without cough reflex   NECK :  without JVD/Nodes/TM/ nl carotid upstrokes bilaterally   LUNGS: no acc muscle use, clear to A and P bilaterally without cough on insp or exp maneuvers   CV:  RRR  no s3 G II/ VI SEM - no increase in P2, no edema   ABD:  soft and nontender with nl excursion in the supine position. No bruits or organomegaly, bowel sounds nl  MS:  warm without deformities, calf tenderness, cyanosis or clubbing  SKIN: warm and dry without lesions    NEURO:  alert, approp, no deficits         CXR PA and Lateral:   01/25/2015 :    I personally reviewed images and agree with radiology impression as follows:    COPD without acute abnormality.   Labs  reviewed:      Chemistry      Component Value Date/Time   NA 138 01/25/2015 1039    NA 140 11/16/2014 1033   K 4.7 01/25/2015 1039   CL 106 01/25/2015 1039   CO2 19 01/25/2015 1039   BUN 32* 01/25/2015 1039   BUN 27 11/16/2014 1033   CREATININE 1.16 01/25/2015 1039      Component Value Date/Time   CALCIUM 10.8* 01/25/2015 1039   ALKPHOS 78 11/16/2014  1033   AST 10 11/16/2014 1033   ALT 5 11/16/2014 1033   BILITOT 0.4 11/16/2014 1033   BILITOT 0.7 02/27/2014 1353        Lab Results  Component Value Date   WBC 9.4 01/25/2015   HGB 12.8 01/25/2015   HCT 39.6 01/25/2015   MCV 92.2 01/25/2015   PLT 217.0 01/25/2015      Lab Results  Component Value Date   TSH 2.02 01/25/2015     Lab Results  Component Value Date   PROBNP 68.0 01/25/2015            Assessment & Plan:

## 2015-03-17 ENCOUNTER — Telehealth: Payer: Self-pay | Admitting: *Deleted

## 2015-03-17 DIAGNOSIS — R2681 Unsteadiness on feet: Secondary | ICD-10-CM | POA: Diagnosis not present

## 2015-03-17 DIAGNOSIS — Z9181 History of falling: Secondary | ICD-10-CM | POA: Diagnosis not present

## 2015-03-17 DIAGNOSIS — N393 Stress incontinence (female) (male): Secondary | ICD-10-CM | POA: Diagnosis not present

## 2015-03-17 DIAGNOSIS — M6281 Muscle weakness (generalized): Secondary | ICD-10-CM | POA: Diagnosis not present

## 2015-03-17 DIAGNOSIS — M545 Low back pain: Secondary | ICD-10-CM | POA: Diagnosis not present

## 2015-03-17 DIAGNOSIS — F4322 Adjustment disorder with anxiety: Secondary | ICD-10-CM | POA: Diagnosis not present

## 2015-03-17 DIAGNOSIS — R278 Other lack of coordination: Secondary | ICD-10-CM | POA: Diagnosis not present

## 2015-03-17 NOTE — Telephone Encounter (Signed)
Message printed and faxed to [facility] Fax#: [phone]

## 2015-03-17 NOTE — Telephone Encounter (Signed)
Clarisse, Caregiver called and stated that Dr. Chilton SiGreen represcribed Fluvoxamine for patient last week, She was previously taken off because she had dizziness and confusion as side effect along with fluctuating blood pressure. Caregiver would like the Medication DISCONTINUED with [facility] ASAP as she is again having these side effects. Caregiver stated that she will monitor patient to see if we can help with the depression without medication and will contact us if needed. Please Advise.

## 2015-03-17 NOTE — Telephone Encounter (Signed)
Ok to d/c fluvoxamine.  Please notify [facility].

## 2015-03-18 DIAGNOSIS — N393 Stress incontinence (female) (male): Secondary | ICD-10-CM | POA: Diagnosis not present

## 2015-03-18 DIAGNOSIS — Z9181 History of falling: Secondary | ICD-10-CM | POA: Diagnosis not present

## 2015-03-18 DIAGNOSIS — R2681 Unsteadiness on feet: Secondary | ICD-10-CM | POA: Diagnosis not present

## 2015-03-18 DIAGNOSIS — M6281 Muscle weakness (generalized): Secondary | ICD-10-CM | POA: Diagnosis not present

## 2015-03-18 DIAGNOSIS — R278 Other lack of coordination: Secondary | ICD-10-CM | POA: Diagnosis not present

## 2015-03-18 DIAGNOSIS — M545 Low back pain: Secondary | ICD-10-CM | POA: Diagnosis not present

## 2015-03-19 DIAGNOSIS — Z9181 History of falling: Secondary | ICD-10-CM | POA: Diagnosis not present

## 2015-03-19 DIAGNOSIS — N393 Stress incontinence (female) (male): Secondary | ICD-10-CM | POA: Diagnosis not present

## 2015-03-19 DIAGNOSIS — M6281 Muscle weakness (generalized): Secondary | ICD-10-CM | POA: Diagnosis not present

## 2015-03-19 DIAGNOSIS — R278 Other lack of coordination: Secondary | ICD-10-CM | POA: Diagnosis not present

## 2015-03-19 DIAGNOSIS — M545 Low back pain: Secondary | ICD-10-CM | POA: Diagnosis not present

## 2015-03-19 DIAGNOSIS — R2681 Unsteadiness on feet: Secondary | ICD-10-CM | POA: Diagnosis not present

## 2015-03-21 NOTE — Assessment & Plan Note (Addendum)
-   hfa 0% 09/17/2013 so try off all inhalers and change losartan to alternative arb > no worse off all inhalers 12/01/2013     Acute flare but No evidence of asthma at all here > rx flare s inhalers using max gerd Rx/ mucinex > bactrim ok in absence of persistently purulent sputum or some other evidence of pulmonary infection which is lacking at this point   I had an extended final summary discussion with the patient reviewing all relevant studies completed to date and  lasting 15 to 20 minutes of a 25 minute visit on the following issues:    Explained the natural history of uri and why it's necessary in patients at risk to treat GERD aggressively - at least  short term -   to reduce risk of evolving cyclical cough initially  triggered by epithelial injury and a heightened sensitivty to the effects of any upper airway irritants,  most importantly acid - related - then perpetuated by epithelial injury related to the cough itself as the upper airway collapses on itself.  That is, the more sensitive the epithelium becomes once it is damaged by the virus, the more the ensuing irritability> the more the cough, the more the secondary reflux (especially in those prone to reflux) the more the irritation of the sensitive mucosa and so on in a  Classic cyclical pattern.    Each maintenance medication was reviewed in detail including most importantly the difference between maintenance and as needed and under what circumstances the prns are to be used.  Please see instructions for details which were reviewed in writing and the patient given a copy.    Pulmonary f/u is prn

## 2015-03-22 DIAGNOSIS — R2681 Unsteadiness on feet: Secondary | ICD-10-CM | POA: Diagnosis not present

## 2015-03-22 DIAGNOSIS — N393 Stress incontinence (female) (male): Secondary | ICD-10-CM | POA: Diagnosis not present

## 2015-03-22 DIAGNOSIS — R278 Other lack of coordination: Secondary | ICD-10-CM | POA: Diagnosis not present

## 2015-03-22 DIAGNOSIS — M6281 Muscle weakness (generalized): Secondary | ICD-10-CM | POA: Diagnosis not present

## 2015-03-22 DIAGNOSIS — Z9181 History of falling: Secondary | ICD-10-CM | POA: Diagnosis not present

## 2015-03-22 DIAGNOSIS — M545 Low back pain: Secondary | ICD-10-CM | POA: Diagnosis not present

## 2015-03-23 DIAGNOSIS — M545 Low back pain: Secondary | ICD-10-CM | POA: Diagnosis not present

## 2015-03-23 DIAGNOSIS — Z9181 History of falling: Secondary | ICD-10-CM | POA: Diagnosis not present

## 2015-03-23 DIAGNOSIS — N393 Stress incontinence (female) (male): Secondary | ICD-10-CM | POA: Diagnosis not present

## 2015-03-23 DIAGNOSIS — R2681 Unsteadiness on feet: Secondary | ICD-10-CM | POA: Diagnosis not present

## 2015-03-23 DIAGNOSIS — M6281 Muscle weakness (generalized): Secondary | ICD-10-CM | POA: Diagnosis not present

## 2015-03-23 DIAGNOSIS — R278 Other lack of coordination: Secondary | ICD-10-CM | POA: Diagnosis not present

## 2015-03-25 DIAGNOSIS — N393 Stress incontinence (female) (male): Secondary | ICD-10-CM | POA: Diagnosis not present

## 2015-03-25 DIAGNOSIS — M545 Low back pain: Secondary | ICD-10-CM | POA: Diagnosis not present

## 2015-03-25 DIAGNOSIS — Z9181 History of falling: Secondary | ICD-10-CM | POA: Diagnosis not present

## 2015-03-25 DIAGNOSIS — R278 Other lack of coordination: Secondary | ICD-10-CM | POA: Diagnosis not present

## 2015-03-25 DIAGNOSIS — R2681 Unsteadiness on feet: Secondary | ICD-10-CM | POA: Diagnosis not present

## 2015-03-25 DIAGNOSIS — M6281 Muscle weakness (generalized): Secondary | ICD-10-CM | POA: Diagnosis not present

## 2015-03-26 DIAGNOSIS — R2681 Unsteadiness on feet: Secondary | ICD-10-CM | POA: Diagnosis not present

## 2015-03-26 DIAGNOSIS — R278 Other lack of coordination: Secondary | ICD-10-CM | POA: Diagnosis not present

## 2015-03-26 DIAGNOSIS — M545 Low back pain: Secondary | ICD-10-CM | POA: Diagnosis not present

## 2015-03-26 DIAGNOSIS — M6281 Muscle weakness (generalized): Secondary | ICD-10-CM | POA: Diagnosis not present

## 2015-03-26 DIAGNOSIS — Z9181 History of falling: Secondary | ICD-10-CM | POA: Diagnosis not present

## 2015-03-26 DIAGNOSIS — N393 Stress incontinence (female) (male): Secondary | ICD-10-CM | POA: Diagnosis not present

## 2015-03-29 DIAGNOSIS — N393 Stress incontinence (female) (male): Secondary | ICD-10-CM | POA: Diagnosis not present

## 2015-03-29 DIAGNOSIS — R278 Other lack of coordination: Secondary | ICD-10-CM | POA: Diagnosis not present

## 2015-03-29 DIAGNOSIS — Z9181 History of falling: Secondary | ICD-10-CM | POA: Diagnosis not present

## 2015-03-29 DIAGNOSIS — R2681 Unsteadiness on feet: Secondary | ICD-10-CM | POA: Diagnosis not present

## 2015-03-29 DIAGNOSIS — M6281 Muscle weakness (generalized): Secondary | ICD-10-CM | POA: Diagnosis not present

## 2015-03-29 DIAGNOSIS — M545 Low back pain: Secondary | ICD-10-CM | POA: Diagnosis not present

## 2015-03-30 DIAGNOSIS — R278 Other lack of coordination: Secondary | ICD-10-CM | POA: Diagnosis not present

## 2015-03-30 DIAGNOSIS — M6281 Muscle weakness (generalized): Secondary | ICD-10-CM | POA: Diagnosis not present

## 2015-03-30 DIAGNOSIS — M545 Low back pain: Secondary | ICD-10-CM | POA: Diagnosis not present

## 2015-03-30 DIAGNOSIS — Z9181 History of falling: Secondary | ICD-10-CM | POA: Diagnosis not present

## 2015-03-30 DIAGNOSIS — R2681 Unsteadiness on feet: Secondary | ICD-10-CM | POA: Diagnosis not present

## 2015-03-30 DIAGNOSIS — N393 Stress incontinence (female) (male): Secondary | ICD-10-CM | POA: Diagnosis not present

## 2015-03-31 ENCOUNTER — Telehealth: Payer: Self-pay

## 2015-03-31 DIAGNOSIS — F4322 Adjustment disorder with anxiety: Secondary | ICD-10-CM | POA: Diagnosis not present

## 2015-03-31 NOTE — Telephone Encounter (Signed)
Letter was received from Debbie (nurse) at current resident stating patient continues to c/o SOB and a non-productive cough. Lungs are clear except for occasional inspiratory.  I called patient's POA and scheduled appointment for next week. Patient did not want to see another provider sooner. Patient's POA states this is an onging issue.

## 2015-04-01 DIAGNOSIS — M6281 Muscle weakness (generalized): Secondary | ICD-10-CM | POA: Diagnosis not present

## 2015-04-01 DIAGNOSIS — M545 Low back pain: Secondary | ICD-10-CM | POA: Diagnosis not present

## 2015-04-01 DIAGNOSIS — R278 Other lack of coordination: Secondary | ICD-10-CM | POA: Diagnosis not present

## 2015-04-01 DIAGNOSIS — R2681 Unsteadiness on feet: Secondary | ICD-10-CM | POA: Diagnosis not present

## 2015-04-01 DIAGNOSIS — Z9181 History of falling: Secondary | ICD-10-CM | POA: Diagnosis not present

## 2015-04-01 DIAGNOSIS — N393 Stress incontinence (female) (male): Secondary | ICD-10-CM | POA: Diagnosis not present

## 2015-04-02 DIAGNOSIS — Z9181 History of falling: Secondary | ICD-10-CM | POA: Diagnosis not present

## 2015-04-02 DIAGNOSIS — M545 Low back pain: Secondary | ICD-10-CM | POA: Diagnosis not present

## 2015-04-02 DIAGNOSIS — R278 Other lack of coordination: Secondary | ICD-10-CM | POA: Diagnosis not present

## 2015-04-02 DIAGNOSIS — R2681 Unsteadiness on feet: Secondary | ICD-10-CM | POA: Diagnosis not present

## 2015-04-02 DIAGNOSIS — M6281 Muscle weakness (generalized): Secondary | ICD-10-CM | POA: Diagnosis not present

## 2015-04-02 DIAGNOSIS — N393 Stress incontinence (female) (male): Secondary | ICD-10-CM | POA: Diagnosis not present

## 2015-04-05 DIAGNOSIS — Z23 Encounter for immunization: Secondary | ICD-10-CM | POA: Diagnosis not present

## 2015-04-05 DIAGNOSIS — I129 Hypertensive chronic kidney disease with stage 1 through stage 4 chronic kidney disease, or unspecified chronic kidney disease: Secondary | ICD-10-CM | POA: Diagnosis not present

## 2015-04-05 DIAGNOSIS — N393 Stress incontinence (female) (male): Secondary | ICD-10-CM | POA: Diagnosis not present

## 2015-04-05 DIAGNOSIS — N183 Chronic kidney disease, stage 3 (moderate): Secondary | ICD-10-CM | POA: Diagnosis not present

## 2015-04-05 DIAGNOSIS — Z9181 History of falling: Secondary | ICD-10-CM | POA: Diagnosis not present

## 2015-04-05 DIAGNOSIS — N189 Chronic kidney disease, unspecified: Secondary | ICD-10-CM | POA: Diagnosis not present

## 2015-04-05 DIAGNOSIS — I2699 Other pulmonary embolism without acute cor pulmonale: Secondary | ICD-10-CM | POA: Diagnosis not present

## 2015-04-05 DIAGNOSIS — E785 Hyperlipidemia, unspecified: Secondary | ICD-10-CM | POA: Diagnosis not present

## 2015-04-05 DIAGNOSIS — R2681 Unsteadiness on feet: Secondary | ICD-10-CM | POA: Diagnosis not present

## 2015-04-05 DIAGNOSIS — N2581 Secondary hyperparathyroidism of renal origin: Secondary | ICD-10-CM | POA: Diagnosis not present

## 2015-04-05 DIAGNOSIS — R278 Other lack of coordination: Secondary | ICD-10-CM | POA: Diagnosis not present

## 2015-04-05 DIAGNOSIS — I739 Peripheral vascular disease, unspecified: Secondary | ICD-10-CM | POA: Diagnosis not present

## 2015-04-05 DIAGNOSIS — M6281 Muscle weakness (generalized): Secondary | ICD-10-CM | POA: Diagnosis not present

## 2015-04-05 DIAGNOSIS — M545 Low back pain: Secondary | ICD-10-CM | POA: Diagnosis not present

## 2015-04-05 DIAGNOSIS — D631 Anemia in chronic kidney disease: Secondary | ICD-10-CM | POA: Diagnosis not present

## 2015-04-07 ENCOUNTER — Ambulatory Visit (INDEPENDENT_AMBULATORY_CARE_PROVIDER_SITE_OTHER): Payer: Medicare Other | Admitting: Internal Medicine

## 2015-04-07 ENCOUNTER — Encounter: Payer: Self-pay | Admitting: Internal Medicine

## 2015-04-07 VITALS — BP 118/70 | HR 77 | Temp 97.3°F | Resp 20 | Ht 67.0 in | Wt 170.0 lb

## 2015-04-07 DIAGNOSIS — M545 Low back pain: Secondary | ICD-10-CM | POA: Diagnosis not present

## 2015-04-07 DIAGNOSIS — F429 Obsessive-compulsive disorder, unspecified: Secondary | ICD-10-CM

## 2015-04-07 DIAGNOSIS — N183 Chronic kidney disease, stage 3 unspecified: Secondary | ICD-10-CM

## 2015-04-07 DIAGNOSIS — I10 Essential (primary) hypertension: Secondary | ICD-10-CM

## 2015-04-07 DIAGNOSIS — R0602 Shortness of breath: Secondary | ICD-10-CM

## 2015-04-07 DIAGNOSIS — N390 Urinary tract infection, site not specified: Secondary | ICD-10-CM | POA: Diagnosis not present

## 2015-04-07 DIAGNOSIS — M6281 Muscle weakness (generalized): Secondary | ICD-10-CM | POA: Diagnosis not present

## 2015-04-07 DIAGNOSIS — E1122 Type 2 diabetes mellitus with diabetic chronic kidney disease: Secondary | ICD-10-CM | POA: Diagnosis not present

## 2015-04-07 DIAGNOSIS — R413 Other amnesia: Secondary | ICD-10-CM

## 2015-04-07 DIAGNOSIS — Z9181 History of falling: Secondary | ICD-10-CM | POA: Diagnosis not present

## 2015-04-07 DIAGNOSIS — N393 Stress incontinence (female) (male): Secondary | ICD-10-CM | POA: Diagnosis not present

## 2015-04-07 DIAGNOSIS — Z86718 Personal history of other venous thrombosis and embolism: Secondary | ICD-10-CM

## 2015-04-07 DIAGNOSIS — I350 Nonrheumatic aortic (valve) stenosis: Secondary | ICD-10-CM

## 2015-04-07 DIAGNOSIS — N182 Chronic kidney disease, stage 2 (mild): Secondary | ICD-10-CM

## 2015-04-07 DIAGNOSIS — R2681 Unsteadiness on feet: Secondary | ICD-10-CM | POA: Diagnosis not present

## 2015-04-07 DIAGNOSIS — R278 Other lack of coordination: Secondary | ICD-10-CM | POA: Diagnosis not present

## 2015-04-07 DIAGNOSIS — M5442 Lumbago with sciatica, left side: Secondary | ICD-10-CM

## 2015-04-07 DIAGNOSIS — F411 Generalized anxiety disorder: Secondary | ICD-10-CM

## 2015-04-07 MED ORDER — AMLODIPINE BESYLATE 5 MG PO TABS: ORAL_TABLET | ORAL | Status: AC

## 2015-04-07 NOTE — Progress Notes (Signed)
Patient ID: Christine Pierce, female   DOB: 16-Jul-1921, 82 y.o.   MRN: [id number]    Facility  [facility]    Place of Service:   OFFICE    Allergies  Allergen Reactions  . Macrodantin [Nitrofurantoin Macrocrystal] Other (See Comments)    Affects kidneys  . Proventil [Albuterol] Other (See Comments)    Patient unable to coordinate use of inhaler    Chief Complaint  Patient presents with  . Follow-up    Follow-up on SOB  . OTHER    POA in room with patient    HPI:   patient presents today with a long list of rolem.   Complains of dyspnea. Sh has en Dr. Melvyn Novas again.she is using the Pepid that he recommended as well as pantoprazole. Oxygen is 95% ater walkin. She says she wheezes at times. She had a 2-D echocadiogram in 2013 that showeortic stenois. She has ot had folow-up echocardiogram since then. She does have a heart mrmr with a grade 2-3 intensity and some irregularity consisting of PVCs.   she says she has a tremor in her ankles.  Her appetite is declining.  Patient worries about drug interactions particularly between Bactrim and losaratn. Other drug interactions that have been listed are between aspirin and amlodipine, aspirin and losartan,  diphenhydramine and losartan , diphenhydramine and    myrbetriq, and aspirin and pantoprazole.  Patient is to see a clinical psychologist who specializes iri geriatricisues, Marisa Severin. She has constant worries about what the future holds for her and whether her finances will hold out.   Patient's back pain seems worse at night despite epidural injections.  She complains of pain in the legs and arms and pain on the left side.   She has urinary frequency and urgency. She is not convinced that her Edyth Gunnels is helping. She has nocturia about 3-4 times.  There is a throbbing in the left leg between the knee and ankle.   Patient has seen Dr. Jimmy Footman.She worries about her renal function.  Medications: Patient's Medications  New  Prescriptions   No medications on file  Previous Medications   ACETAMINOPHEN (TYLENOL 8 HOUR ARTHRITIS PAIN) 650 MG CR TABLET    Take 650 mg by mouth 3 (three) times daily. For arthritis pain   AMLODIPINE (NORVASC) 10 MG TABLET    Take 1 tablet (10 mg total) by mouth daily.   ASPIRIN EC 81 MG TABLET    One daily for anticoagulation   CALCITONIN, SALMON, (MIACALCIN/FORTICAL) 200 UNIT/ACT NASAL SPRAY    Place 1 spray into alternate nostrils every other day.    FAMOTIDINE (PEPCID) 20 MG TABLET    One at bedtime   FLUVOXAMINE (LUVOX) 100 MG TABLET    One at bedtime to help nerves   GUAIFENESIN (MUCINEX) 600 MG 12 HR TABLET    Take 600 mg by mouth daily as needed.   LIDOCAINE (ASPERCREME LIDOCAINE) 4 % PTCH    Apply 1 patch topically every morning. To the back to relieve pain   LOSARTAN (COZAAR) 50 MG TABLET    Take 50 mg by mouth daily.   MELATONIN 3 MG CAPS    Take 3 mg by mouth daily.   MIRABEGRON ER (MYRBETRIQ) 25 MG TB24 TABLET    One daily to help bladder control   NITROGLYCERIN (NITROSTAT) 0.4 MG SL TABLET    Place 0.4 mg under the tongue every 5 (five) minutes as needed for chest pain.   PANTOPRAZOLE (PROTONIX) 40 MG TABLET  Take 1 tablet (40 mg total) by mouth daily. Take 30-60 min before first meal of the day   SENNA-DOCUSATE (SENOKOT-S) 8.6-50 MG PER TABLET    One at bed to prevent constipation  Modified Medications   No medications on file  Discontinued Medications   ACETAMINOPHEN (TYLENOL ARTHRITIS PAIN) 650 MG CR TABLET    Take 650 mg by mouth 3 (three) times daily. For arthritis pain   ACETAMINOPHEN (TYLENOL) 500 MG TABLET    Take 500 mg by mouth every 4 (four) hours as needed (pain).    SULFAMETHOXAZOLE-TRIMETHOPRIM (BACTRIM DS,SEPTRA DS) 800-160 MG TABLET    Take 1 tablet by mouth 2 (two) times daily.    Review of Systems  Constitutional: Positive for fatigue. Negative for fever, chills, diaphoresis, activity change, appetite change and unexpected weight change.  HENT:  Positive for hearing loss.   Eyes: Negative.   Respiratory: Positive for cough and shortness of breath.        Complains of a hoarse voice for the last few months. There is no pain.  Cardiovascular: Positive for leg swelling. Negative for palpitations.  Gastrointestinal: Positive for constipation and rectal pain.       Occasional fecal incontinence. Sometimes has difficulty swallowing. Solids and liquids seem to be involved. There is no odynophagia.  Endocrine:       Elevated blood sugars. Currently on "dietary control". She is not following her diet closely.  Genitourinary: Positive for urgency and frequency. Negative for dysuria, decreased urine volume, vaginal bleeding, vaginal discharge, enuresis, genital sores, vaginal pain and pelvic pain.       UTI 02/17/14. Incontinent of urine.  Musculoskeletal:       Generalized weakness: Pain in the right hip area. Requires assistance in getting up and down. At risk for falls. Pain in the right leg between the knee and ankle close to a previous scar.  Skin: Negative.   Allergic/Immunologic: Negative.   Neurological: Positive for weakness.       Memory loss.  Hematological: Negative.   Psychiatric/Behavioral: Positive for confusion. The patient is nervous/anxious.        History of hoarding behavior.Past history of impulsive buying of products on television.    Filed Vitals:   04/07/15 1432  BP: 118/70  Pulse: 77  Temp: 97.3 F (36.3 C)  TempSrc: Oral  Resp: 20  Height: _0  (1.702 m)  Weight: 170 lb (77.111 kg)  SpO2: 93%   Body mass index is 26.62 kg/(m^2). Filed Weights   04/07/15 1432  Weight: 170 lb (77.111 kg)     Physical Exam  Constitutional: She is oriented to person, place, and time. No distress.  Overweight  HENT:  Head: Atraumatic.  Partial deafness. Uses hearing aids bilaterally. Complains of left ear pain.  Eyes:  Wears prescription lenses.  Neck: No JVD present. No tracheal deviation present. No thyromegaly  present.  Some tenderness with movement laterally to either side.  Cardiovascular: Normal rate and regular rhythm.  Exam reveals no friction rub.   Murmur (3/6 SEM at LSB and aortic ejection) heard. Bilateral varicose veins  Pulmonary/Chest: No respiratory distress. She has no wheezes. She has no rales. She exhibits tenderness (left lower chest wall).  Tender left flank area lower rib margins laterally and slightly anteriorly. Voice is slightly hoarse.  Abdominal: Soft. Bowel sounds are normal. She exhibits no distension and no mass. There is no tenderness.  Genitourinary: Guaiac negative stool.  Anal fissure at 6 o'clock  Musculoskeletal: Normal range of  motion. She exhibits no edema or tenderness.  Tender the greater trochanter and right groin. She is hobbled by a decrease in her stride length. Low back pains, more on the left side. Pain in the lower right leg close to a previous scar  Lymphadenopathy:    She has no cervical adenopathy.  Neurological: She is alert and oriented to person, place, and time. No cranial nerve deficit. She exhibits normal muscle tone. Coordination normal.  04/30/12 MMSE 27/30. Passed clock drawing. Having some trouble in sequencing events and in telling a coherent story.  Insensitive to vibration in the feet.  Skin: No rash noted. She is not diaphoretic. No erythema. No pallor.  Psychiatric: She has a normal mood and affect. Her behavior is normal. Judgment and thought content normal.    Labs reviewed: Lab Summary Latest Ref Rng 01/25/2015 11/16/2014 09/15/2014  Hemoglobin 12.0 - 15.0 g/dL 12.8 12.1 (None)  Hematocrit 36.0 - 46.0 % 39.6 37.0 (None)  White count 4.0 - 10.5 K/uL 9.4 5.4 (None)  Platelet count 150.0 - 400.0 K/uL 217.0 (None) (None)  Sodium 135 - 145 mEq/L 138 140 140  Potassium 3.5 - 5.1 mEq/L 4.7 4.9 5.2  Calcium 8.4 - 10.5 mg/dL 10.8(H) 10.6(H) 10.2  Phosphorus - (None) (None) (None)  Creatinine 0.40 - 1.20 mg/dL 1.16 1.22(H) 1.29(H)  AST 0  - 40 IU/L (None) 10 19  Alk Phos 39 - 117 IU/L (None) 78 86  Bilirubin 0.0 - 1.2 mg/dL (None) 0.4 0.3  Glucose 70 - 99 mg/dL 100(H) 94 97  Cholesterol - (None) (None) (None)  HDL cholesterol >39 mg/dL (None) (None) 71  Triglycerides 0 - 149 mg/dL (None) (None) 85  LDL Direct - (None) (None) (None)  LDL Calc 0 - 99 mg/dL (None) (None) 57  Total protein - (None) (None) (None)  Albumin 3.2 - 4.6 g/dL (None) 4.5 4.2   Lab Results  Component Value Date   TSH 2.02 01/25/2015   TSH 1.43 12/01/2013   TSH 1.150 03/26/2013   Lab Results  Component Value Date   BUN 32* 01/25/2015   BUN 27 11/16/2014   BUN 27 09/15/2014   Lab Results  Component Value Date   HGBA1C 6.0* 09/15/2014   HGBA1C 6.2* 01/19/2014   HGBA1C 6.1* 07/18/2013    Assessment/Plan 1. Aortic stenosis - Echocardiogram; Future  2. CKD (chronic kidney disease) stage 2, GFR 60-89 ml/min - CMP, future  3. Controlled type 2 diabetes mellitus with stage 3 chronic kidney disease, without long-term current use of insulin (HCC) -A1c, CMP, future  4. Essential hypertension Continue amlodipine and losartan  5. History of DVT (deep vein thrombosis) right leg Possible source of leg discomfort. No current palpable venous cord or localized inflammation, or tenderness.  6. Memory deficits persistent at about the sma level as last year  7. Anxiety state persistent  8. Obsessive compulsive disorder Variable activity. To See cliincal psychologist  9. Urinary tract infection without hematuria, site unspecified reolved  10. Shortness of breath Possibly related to AS. 2 D echo to be scheduled  11. Left-sided low back pain with left-sided sciatica She requests anther epidural, even though the last one did not seem effective to her. - DG Epidural/Nerve Root; Future

## 2015-04-14 DIAGNOSIS — F4322 Adjustment disorder with anxiety: Secondary | ICD-10-CM | POA: Diagnosis not present

## 2015-04-26 ENCOUNTER — Telehealth: Payer: Self-pay | Admitting: Neurology

## 2015-04-26 NOTE — Telephone Encounter (Signed)
Rn call patients daughter about wanting a referral to a neuro psychology md. Pt has appt with Dr.Xu on March 6,2017 for follow up. Pt was last seen 10/2015.Pt states her mom always scores a 27/30 on our MMSE, and wants her to have a different test to see why her memory is getting a little worse. Rn stated Dr.Xu can do a referral on 05/10/2015 at the office visit. Pts daughter Ascencion Dike stated the psychologist recommended the referral. Rn stated it she wanted the referral early, maybe the psychologist can do the referral. Pts daughter verbalized understanding.

## 2015-04-26 NOTE — Telephone Encounter (Signed)
Pt's daughter, [name] , called to request a referral to Neuro Rehab for her mother to have a memory test done. She was told that a neuropsychological exam needed to be done. She was told this by Dr. Raphael Gibney, [place] - [phone] pts pcp. Please call and advise [phone]

## 2015-04-28 ENCOUNTER — Other Ambulatory Visit: Payer: Self-pay

## 2015-04-28 ENCOUNTER — Ambulatory Visit (HOSPITAL_COMMUNITY): Payer: Medicare Other | Attending: Internal Medicine

## 2015-04-28 ENCOUNTER — Telehealth: Payer: Self-pay | Admitting: *Deleted

## 2015-04-28 DIAGNOSIS — E119 Type 2 diabetes mellitus without complications: Secondary | ICD-10-CM | POA: Insufficient documentation

## 2015-04-28 DIAGNOSIS — I350 Nonrheumatic aortic (valve) stenosis: Secondary | ICD-10-CM | POA: Insufficient documentation

## 2015-04-28 DIAGNOSIS — I119 Hypertensive heart disease without heart failure: Secondary | ICD-10-CM | POA: Diagnosis not present

## 2015-04-28 NOTE — Telephone Encounter (Signed)
Patient son, Doreatha Martin called and stated that he needed a FL2 Form for skilled nursing but may not need it for up to a year. In Process of looking for placement. Informed him that the Form is only good for 30 days and once a facility accepts her that we could fill one out.

## 2015-05-02 ENCOUNTER — Encounter: Payer: Self-pay | Admitting: Internal Medicine

## 2015-05-03 ENCOUNTER — Other Ambulatory Visit: Payer: Self-pay

## 2015-05-03 DIAGNOSIS — I359 Nonrheumatic aortic valve disorder, unspecified: Secondary | ICD-10-CM

## 2015-05-03 NOTE — Addendum Note (Signed)
Addended byParticia Lather C on: 05/03/2015 04:39 PM   Modules accepted: Orders

## 2015-05-04 NOTE — Telephone Encounter (Signed)
Patient son, Doreatha Martin called and Left message and stated that he needed an FL2 Form to be filled out and emailed to him. Called him and left message stating we needed to know which facility she was going to and that we could not email the FL2 form.

## 2015-05-05 ENCOUNTER — Other Ambulatory Visit: Payer: Self-pay

## 2015-05-05 ENCOUNTER — Telehealth: Payer: Self-pay

## 2015-05-05 DIAGNOSIS — M545 Low back pain: Secondary | ICD-10-CM | POA: Diagnosis not present

## 2015-05-05 DIAGNOSIS — R278 Other lack of coordination: Secondary | ICD-10-CM | POA: Diagnosis not present

## 2015-05-05 DIAGNOSIS — I359 Nonrheumatic aortic valve disorder, unspecified: Secondary | ICD-10-CM

## 2015-05-05 DIAGNOSIS — I129 Hypertensive chronic kidney disease with stage 1 through stage 4 chronic kidney disease, or unspecified chronic kidney disease: Secondary | ICD-10-CM | POA: Diagnosis not present

## 2015-05-05 DIAGNOSIS — N393 Stress incontinence (female) (male): Secondary | ICD-10-CM | POA: Diagnosis not present

## 2015-05-05 DIAGNOSIS — M6281 Muscle weakness (generalized): Secondary | ICD-10-CM | POA: Diagnosis not present

## 2015-05-05 NOTE — Telephone Encounter (Signed)
Patient son, Doreatha Martin called back and left message stating that patient is going to [facility] and [phone] [place] [address] [phone] Fax: [phone] Attn: Tomasa Hose Son would like for it to be faxed to them once completed. Filled out FL2 Form and left for Dr. Chilton Si to review and sign with last OV note and facesheet.

## 2015-05-05 NOTE — Telephone Encounter (Signed)
Patient was seen at the urologist today and Clarisse noticed some inaccuracies with patient's medication list. Patient is currently taking Amlodipine 10 mg daily while we have her listed as 5 mg daily. Clarisse would like for this message to wait for Dr.Green to confirm which dose patient should be on. Please advise  Clarisse also asked when was patient switched from Micardis for she though patient was suppose to still take that medication. I researched both concerns and was unable to tell when medication was switched from Micardis and when the dose of Amlodipine was changed.   Patient's blood pressure is stable

## 2015-05-06 NOTE — Telephone Encounter (Signed)
Our list should reflect amlodipine 5 mg daily that is at the end of the 04/07/15 note. Losartan is the lower cost equivalent of MIcardis. She should be on Losartan, not Micardis.

## 2015-05-10 ENCOUNTER — Ambulatory Visit (INDEPENDENT_AMBULATORY_CARE_PROVIDER_SITE_OTHER): Payer: Medicare Other | Admitting: Neurology

## 2015-05-10 ENCOUNTER — Encounter: Payer: Self-pay | Admitting: Neurology

## 2015-05-10 VITALS — Ht 67.0 in | Wt 175.0 lb

## 2015-05-10 DIAGNOSIS — R413 Other amnesia: Secondary | ICD-10-CM

## 2015-05-10 DIAGNOSIS — I10 Essential (primary) hypertension: Secondary | ICD-10-CM | POA: Diagnosis not present

## 2015-05-10 DIAGNOSIS — E785 Hyperlipidemia, unspecified: Secondary | ICD-10-CM | POA: Diagnosis not present

## 2015-05-10 DIAGNOSIS — F329 Major depressive disorder, single episode, unspecified: Secondary | ICD-10-CM

## 2015-05-10 DIAGNOSIS — R4587 Impulsiveness: Secondary | ICD-10-CM

## 2015-05-10 DIAGNOSIS — G3184 Mild cognitive impairment, so stated: Secondary | ICD-10-CM | POA: Diagnosis not present

## 2015-05-10 DIAGNOSIS — F32A Depression, unspecified: Secondary | ICD-10-CM

## 2015-05-10 NOTE — Patient Instructions (Addendum)
-   still recommend ASA 81mg  unless contraindicated. There is no interaction between ASA and losartan. But will up to Dr. Chilton SiGreen.  - agree with neuropsychology testing.  - recommend psychiatrist referral for impulsivity and behavior issue. - follow up with PCP regularly. - follow up in 6 months.

## 2015-05-10 NOTE — Telephone Encounter (Signed)
LMOM to return call.

## 2015-05-10 NOTE — Progress Notes (Signed)
STROKE NEUROLOGY FOLLOW UP NOTE  NAME: Christine Pierce DOB: [birth year]  REASON FOR VISIT: stroke follow up HISTORY FROM: chart and friend  Today we had the pleasure of seeing Christine Pierce in follow-up at our Neurology Clinic. Pt was accompanied by friend.   History Summary Christine Pierce is a 82 y.o. female with PMH of HTN, HLD, COPD, GERD, CKD, and anxiety who was seen as a new patient for cognitive concerns on 12/26/13  Pt was doing well and in her normal health until around 2000 when she lost her mom and son within 2-year period. Pt was depressed at that time and became social withdraw. She was not going out and stayed in her house, lived alone, not eating well, not dressing well, not speak well, very disorganized in house, rodents everywhere, uncontrolled buying through telephone, accumulated a lot of stuff in house such that no one could get into her house. She spent all her money and also borrowed significant amount of debt. Eventually, she had to sale her house to pay debt and she was moved to ALF about 1.5 years ago. Since then, she was eating better, more cooperative, had financial help from her friend, although still a lot of debt left but she is going to the right direction.  While she lives in ALF, friends found her having trouble to learn new things, such as how to use inhalers, remote control, and new hearing aids, how to dial numbers from her new phones, etc. She was taught many times but just not able to do it herself. However, she was able to do all her ADLs, writing checks, and still very talkative. She denies any weakness, numbness, LOC, vision changes or speech changes. She denies she has any memory problem. She follows with her PCP every 2-3 months and she denies any smoking, alcohol or illicit drugs. She was referred by Dr. Chilton Si for cognitive evaluation.  Had conversation with her during visit, she behaved very impulsive, pressured speech, super talkative and with minimal  cognitive impairment. However, her MOCA was 27/30. She was not felt to have cognitive impairment but rather decreased concentration and attention with picture of ADHD type psychiatric disorders. She was recommended to have psych follow up and check dementia labs.  Follow up 05/11/14 - she was hospitalized first from 02/13/2014 to 02/21/2014 for L2 compression fracture following a fall. She under went kyphoplasty on 02/19/14. Hospitalization was complicated by urinary tract infection. She was then discharged to [facility] skilled nursing facility.  She was re-hospitalized from 02/27/2014 to 03/01/2014 for chest pain and right hip pain. Cardiac workup did not disclose myocardial infarction. However, she was found to have a pulmonary embolus and an acute DVT. She was discharged with Xarelto. She had urinary retention which required Foley catheter insertion. Lab showed acute renal failure, which was resolved prior to discharge. She is still on ASA.   Patient currently still in [facility]. Her Foley catheter has been removed and she was able to void on her own. Interestingly, she is still on oxybutynin which she has been on for several years Qhs to decreased urination at night. As per her friend, for the last several months, her cognition is getting worse. Her short memory loss seems getting worse, for example, she kept asking the friend "how about her niece" even giving answers everytime. She also less active as before, more sedentary life style. She was put on Addrell and her celexa was discontinued. Friend did not see much improvement.  She has not seen psychiatrist or psychologist yet.   10/29/14 follow up - she was doing well. Still lives in ALF. General cognition is good and she is sharp. However, mentally, she is not very stable. She has episodes of confusion, decreased memory, depression and anxiety. She has seen psychiatrist for anxiety and depression treatment.  She has stopped oxybutynin but did not  find any improvement of cognition but has frequent urination at night which makes her not sleep well. She is on Mirabegron for bladder control now. Her anxiety and depression also makes her not sleep well. She is on melatonin.  She has reasonable worries. Her son in [place] comes to see her 3-4 time a year. She felt not get much family support. She also worries about her insurance coverage for her ALF. She wants to go back to independent living. She worries about her future and what is going to happen to her next at the age of 89, etc.  She is still on Xarelto now and she is going to discuss with PCP next visit. BP 170/78 today but she is on norvasc and losartan and she said her BP OK in ALF.   Interval History During the interval time, pt has been doing the same. As per her caregiver, she had psychology counseling but not effective. Currently, counseling has been stopped but neuropsych testing has been ordered and will be done in 07/2015. Not seen psychiatrist yet. Pt continued to be impulsive, short memory loss, sometimes anxious and confused. Has been changing living facilities several times. Not sure if the environment change plays role in her behavior. As per pt, she felt anxious because she wants to live close to her son who is her only family memeber now. She also concerns that her money is running out.   REVIEW OF SYSTEMS: Full 14 system review of systems performed and notable only for those listed below and in HPI above, all others are negative:  Constitutional:   Cardiovascular:  murmur Ear/Nose/Throat:   Carry loss Skin:  Eyes:   Respiratory:   SOB Gastroitestinal:   Genitourinary:  Incontinence of bladder, frequency of urination , urgency Hematology/Lymphatic:   Endocrine:  Musculoskeletal:   Back pain, walking difficulty Allergy/Immunology:   Neurological:   Memory loss Psychiatric: confusion, decreased concentration, anxious Sleep:  Frequent waking  The following represents the  patient's updated allergies and side effects list: Allergies  Allergen Reactions  . Macrodantin [Nitrofurantoin Macrocrystal] Other (See Comments)    Affects kidneys  . Proventil [Albuterol] Other (See Comments)    Patient unable to coordinate use of inhaler    The neurologically relevant items on the patient's problem list were reviewed on today's visit.  Neurologic Examination  A problem focused neurological exam (12 or more points of the single system neurologic examination, vital signs counts as 1 point, cranial nerves count for 8 points) was performed.  Height  (1.702 m), weight 175 lb (79.379 kg).  General - Well nourished, well developed, in no apparent distress.  Ophthalmologic - Sharp disc margins OU.  Cardiovascular - Regular rate and rhythm with no murmur. Carotid pulses were 2+ without bruits .   Neck - supple, no nuchal rigidity .  Mental Status -  Level of arousal and orientation to year and month, place, and person were intact, but not to date. Language including expression, naming, repetition, comprehension, reading, and writing was assessed and found intact. Attention span and concentration were normal. Recent and remote memory were 5/5 registration  and 3/5 delayed recall with category cues 5/5. Fund of Knowledge was assessed and was intact.  Cranial Nerves II - XII - II - Visual field intact OU. III, IV, VI - Extraocular movements intact. V - Facial sensation intact bilaterally. VII - Facial movement intact bilaterally. VIII - Hearing & vestibular intact bilaterally. X - Palate elevates symmetrically. XI - Chin turning & shoulder shrug intact bilaterally. XII - Tongue protrusion intact.  Motor Strength - The patient's strength was normal in all extremities and pronator drift was absent. Bulk was normal and fasciculations were absent.  Motor Tone - Muscle tone was assessed at the neck and appendages and was normal.  Reflexes - The patient's  reflexes were normal in all extremities and she had no pathological reflexes.  Sensory - Light touch, temperature/pinprick were assessed and were normal.   Coordination - The patient had normal movements in the hands and feet with no ataxia or dysmetria. Tremor was absent.  Gait and Station - walk with rolling walker, slow, small stride, stooped posturing.  Data reviewed: I personally reviewed the images and agree with the radiology interpretations.  VQ scan 02/27/14 - Findings compatible high probability for pulmonary embolism.  Venous doppler 02/27/14 - No evidence of deep vein thrombosis involving the left lower extremity. - Findings consistent with indeterminate age, probably chronic, deep vein thrombosis involving the right popliteal vein. - No evidence of Baker&'s cyst on the right or left.  Component     Latest Ref Rng 12/01/2013 01/19/2014 02/27/2014  Hemoglobin A1C     4.8 - 5.6 %  6.2 (H)   Est. average glucose Bld gHb Est-mCnc       131   TSH     0.35 - 4.50 uIU/mL 1.43    D-Dimer, Quant     0.00 - 0.48 ug/mL-FEU   >20.00 (H)   Component     Latest Ref Rng 09/15/2014  Cholesterol, Total     100 - 199 mg/dL 161  Triglycerides     0 - 149 mg/dL 85  HDL Cholesterol     >39 mg/dL 71  VLDL Cholesterol Cal     5 - 40 mg/dL 17  LDL (calc)     0 - 99 mg/dL 57  Total CHOL/HDL Ratio     0.0 - 4.4 ratio units 2.0  Hemoglobin A1C     4.8 - 5.6 % 6.0 (H)  Est. average glucose Bld gHb Est-mCnc      126    Assessment: As you may recall, she is a 82 y.o. Caucasian female with PMH of HTN, HLD, COPD, GERD, CKD, and anxiety presented first on 12/26/13 as a new consult for cognitive decline. However, on repeated neuro exam and interaction with pt, she does not have dementia, instead her mind is sharp. Her MOCA 27/30, 23/30 and 26/30 on serial tests. 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 However, she does have  general anxiety disorder and depression, worrying about everything and depressed about her situation which are all reasonable. She had L2 compression fracture and probable PE and DVT last year, and was on Xarelto, now off. Not on ASA due to concerns of interaction between ASA and losartan ?? Her oxybutynin has stopped due to anticholinergic effects. She has insomnia on melatonin. At this time, she is cognitively intact and no sign of dementia but she dose have anxiety and depression as well as impulsivity. Had psychology counseling but not effective. neuropsych testing  pending. Would recommend psychiatry referral.    Plan:  - still recommend ASA 81mg  unless contraindicated. There is no interaction between ASA and losartan. But will up to Dr. Chilton SiGreen.  - agree with neuropsychology testing.  - recommend psychiatrist referral for impulsivity and behavior issue. - follow up with PCP regularly. - follow up in 6 months.  I spent more than 25 minutes of face to face time with the patient. Greater than 50% of time was spent in counseling and coordination of care. We discussed about ASA indication, psychiatry referral and neuropsych testing.   Orders Placed This Encounter  Procedures  . Ambulatory referral to Psychiatry    Referral Priority:  Routine    Referral Type:  Psychiatric    Referral Reason:  Specialty Services Required    Requested Specialty:  Psychiatry    Number of Visits Requested:  1    No orders of the defined types were placed in this encounter.    Patient Instructions  - still recommend ASA 81mg  unless contraindicated. There is no interaction between ASA and losartan. But will up to Dr. Chilton SiGreen.  - agree with neuropsychology testing.  - recommend psychiatrist referral for impulsivity and behavior issue. - follow up with PCP regularly. - follow up in 6 months.    Marvel PlanJindong Shamyah Stantz, MD PhD [facility] [address], [address] [place], [place] [address] [phone]  [phone]

## 2015-05-12 DIAGNOSIS — R4587 Impulsiveness: Secondary | ICD-10-CM | POA: Insufficient documentation

## 2015-05-14 DIAGNOSIS — R278 Other lack of coordination: Secondary | ICD-10-CM | POA: Diagnosis not present

## 2015-05-14 DIAGNOSIS — M6281 Muscle weakness (generalized): Secondary | ICD-10-CM | POA: Diagnosis not present

## 2015-05-14 DIAGNOSIS — M545 Low back pain: Secondary | ICD-10-CM | POA: Diagnosis not present

## 2015-05-14 DIAGNOSIS — N393 Stress incontinence (female) (male): Secondary | ICD-10-CM | POA: Diagnosis not present

## 2015-05-18 DIAGNOSIS — M6281 Muscle weakness (generalized): Secondary | ICD-10-CM | POA: Diagnosis not present

## 2015-05-18 DIAGNOSIS — R278 Other lack of coordination: Secondary | ICD-10-CM | POA: Diagnosis not present

## 2015-05-18 DIAGNOSIS — N393 Stress incontinence (female) (male): Secondary | ICD-10-CM | POA: Diagnosis not present

## 2015-05-18 DIAGNOSIS — M545 Low back pain: Secondary | ICD-10-CM | POA: Diagnosis not present

## 2015-06-04 ENCOUNTER — Encounter: Payer: Self-pay | Admitting: Cardiovascular Disease

## 2015-06-04 ENCOUNTER — Ambulatory Visit (INDEPENDENT_AMBULATORY_CARE_PROVIDER_SITE_OTHER): Payer: Medicare Other | Admitting: Cardiovascular Disease

## 2015-06-04 VITALS — BP 124/74 | HR 80 | Ht 67.0 in | Wt 176.1 lb

## 2015-06-04 DIAGNOSIS — I359 Nonrheumatic aortic valve disorder, unspecified: Secondary | ICD-10-CM

## 2015-06-04 DIAGNOSIS — I350 Nonrheumatic aortic (valve) stenosis: Secondary | ICD-10-CM

## 2015-06-04 NOTE — Patient Instructions (Signed)
Medication Instructions:  Your physician recommends that you continue on your current medications as directed. Please refer to the Current Medication list given to you today.   Labwork: None Ordered   Testing/Procedures: None Ordered   Follow-Up: Your physician wants you to follow-up in: 6 months with Dr. Nahser.  You will receive a reminder letter in the mail two months in advance. If you don't receive a letter, please call our office to schedule the follow-up appointment.   If you need a refill on your cardiac medications before your next appointment, please call your pharmacy.   Thank you for choosing [facility]! Carolyna Yerian, RN [phone]    

## 2015-06-04 NOTE — Progress Notes (Signed)
Cardiology Office Note   Date:  06/04/2015   ID:  Christine Pierce, Christine Pierce Aug 16, 1921, MRN [id number]  PCP:  Kimber Relic, MD  Cardiologist:   Vesta Mixer, MD   Chief Complaint  Patient presents with  . New Patient (Initial Visit)    dyspnea, leg swelling   . Aortic Stenosis   Problem List 1. Aortic stenosis    History of Present Illness: Christine Pierce is a 82 y.o. female who presents for evaluation of her shortness of breath Echo revealed severe aortic stenosis.   Was seen with Christine Pierce, ( health care POA)   Christine Pierce has had some chronic memory decline . Has had progressive shortness of breath  Is a retired Comptroller. Lives in [place]. Assisted living .  Past Medical History  Diagnosis Date  . Hypertension 10/23/2011  . Cystocele, midline 04/10/2012  . Cough 04/10/2012  . Full incontinence of feces 04/10/2012  . Urgency of urination 04/10/2012  . Abdominal pain, other specified site   . Orthopnea 12/01/2011  . Other and unspecified hyperlipidemia 10/20/2011  . Urinary tract infection, site not specified 09/11/2011  . Muscle weakness (generalized) 09/11/2011  . Syncope and collapse 03/29/2011  . Pain in joint, ankle and foot 12/27/2010  . Sprain of ribs 08/15/2010  . Unspecified tinnitus 07/04/2010  . Pneumonia, organism unspecified 05/04/2010  . Diverticulosis of colon (without mention of hemorrhage)   . Other and unspecified ovarian cyst 05/04/2010  . Abdominal pain, right lower quadrant 05/02/2010  . Anxiety state, unspecified 02/14/2010  . Shortness of breath 01/05/2010  . Pain in joint, shoulder region 11/15/2009  . Pain in joint, pelvic region and thigh 07/29/2009  . Personal history of fall 07/29/2009  . Other malaise and fatigue 02/15/2009  . Obesity, unspecified 07/16/2008  . Unspecified hereditary and idiopathic peripheral neuropathy   . Reflux esophagitis 10/18/2007  . Unspecified vitamin D deficiency 07/19/2007  . Chest  pain, unspecified 07/19/2007  . Varicose veins of lower extremities with inflammation 04/25/2006  . Restless legs syndrome (RLS) 01/16/2006  . Nocturia 07/07/2005  . Cervicalgia 01/20/2004  . Osteoporosis, unspecified 01/23/2003  . Sciatica 04/03/2001  . Pain in joint, lower leg 12/11/2001  . Lumbago 02/20/2001  . Hard of hearing     both ears  . Chronic kidney disease, stage III (moderate) 12/15/09  . Acute kidney failure, unspecified (HCC) 12/10/2009  . Hydronephrosis 07/16/2008  . Anxiety   . Depression   . Abnormality of gait 12/17/2012  . Compression fracture of L2 (HCC)   . COPD (chronic obstructive pulmonary disease) (HCC) 03/26/2013  . Diabetes mellitus with renal manifestations, controlled (HCC) 12/17/2013  . Dysphagia, pharyngoesophageal phase 07/22/2013  . Hyperlipidemia 04/28/2014  . Memory deficits 05/08/2013  . Abnormal CT scan of lung 09/04/2012    Linear area of increased density at the posterior aspect of the superior segment left lower lobe appears slightly less prominent on the current exam suggesting atelectasis. No definite new or progressive findings are seen which are worrisome for tumor.   . ADHD (attention deficit hyperactivity disorder) 01/27/2014  . Anal fissure 10/21/2013  . Anxiety state 10/21/2013  . Back pain   . Cerebral atrophy 04/29/2014  . Cerebrovascular disease 04/29/2014  . Constipation 10/21/2013  . Fatigue 09/04/2012  . History of DVT (deep vein thrombosis) right leg 04/29/2014  . History of pulmonary embolism 02/13/2014  . Hoarding behavior 05/08/2013  . Pain in hip 10/22/2012  . Right hip pain 02/27/2014  .  Tremor 10/21/2013    Bilateral fine quiver   . Upper airway cough syndrome 09/17/2013    Followed in Pulmonary clinic/ [facility]/ [place]  - hfa 0% 09/17/2013 so try off all inhalers and change losartan to alternative arb > no worse off all inhalers 12/01/2013    . UTI (urinary tract infection) 09/04/2012  . Varicose veins 07/22/2013    Past  Surgical History  Procedure Laterality Date  . Appendectomy    . Stapedectomy Bilateral [phone]    middle ear surgery  . Eye surgery Left 1988    cataract w IOL implant, Dr. Vic RipperPerlman  . Fracture surgery Right 09/2004    distal radius/Dr. Teressa SenterSypher  . Tonsillectomy and adenoidectomy  age 82  . Esophagogastroduodenoscopy (egd) with propofol N/A 09/03/2013    Procedure: ESOPHAGOGASTRODUODENOSCOPY (EGD) WITH PROPOFOL;  Surgeon: Willis ModenaWilliam Outlaw, MD;  Location: [facility] END[facility];  Service: Endoscopy;  Laterality: N/A;  . Botox injection N/A 09/03/2013    Procedure: BOTOX INJECTION;  Surgeon: Willis ModenaWilliam Outlaw, MD;  Location: [facility] ENDOSCOPY;  Service: Endoscopy;  Laterality: N/A;  . Vertebroplasty  02/13/14     Current Outpatient Prescriptions  Medication Sig Dispense Refill  . acetaminophen (TYLENOL 8 HOUR ARTHRITIS PAIN) 650 MG CR tablet Take 650 mg by mouth 3 (three) times daily. For arthritis pain    . amLODipine (NORVASC) 5 MG tablet One daily to control BP (Patient taking differently: Take 5 mg by mouth daily. One daily to control BP) 90 tablet 3  . calcitonin, salmon, (MIACALCIN/FORTICAL) 200 UNIT/ACT nasal spray Place 1 spray into alternate nostrils every other day.     . famotidine (PEPCID) 20 MG tablet One at bedtime (Patient taking differently: Take 20 mg by mouth at bedtime. One at bedtime) 30 tablet 2  . guaiFENesin (MUCINEX) 600 MG 12 hr tablet Take 600 mg by mouth daily as needed for cough or to loosen phlegm.     . Lidocaine (ASPERCREME LIDOCAINE) 4 % PTCH Apply 1 patch topically every morning. To the back to relieve pain 30 patch 5  . losartan (COZAAR) 50 MG tablet Take 50 mg by mouth daily.    . Melatonin 3 MG CAPS Take 3 mg by mouth daily.    . nitroGLYCERIN (NITROSTAT) 0.4 MG SL tablet Place 0.4 mg under the tongue every 5 (five) minutes as needed for chest pain.    . pantoprazole (PROTONIX) 40 MG tablet Take 1 tablet (40 mg total) by mouth daily. Take 30-60 min before first meal of the day  30 tablet 2  . senna-docusate (SENOKOT-S) 8.6-50 MG per tablet One at bed to prevent constipation (Patient taking differently: Take 1 tablet by mouth at bedtime. One at bed to prevent constipation) 30 tablet 5   No current facility-administered medications for this visit.    Allergies:   Macrodantin and Proventil    Social History:  The patient  reports that she has never smoked. She has never used smokeless tobacco. She reports that she does not drink alcohol or use illicit drugs.   Family History:  The patient's family history includes Cancer in her father and mother; Heart disease in her son.    ROS:  Please see the history of present illness.    Review of Systems: Constitutional:  denies fever, chills, diaphoresis, appetite change and fatigue.  HEENT: denies photophobia, eye pain, redness, hearing loss, ear pain, congestion, sore throat, rhinorrhea, sneezing, neck pain, neck stiffness and tinnitus.  Respiratory: admits to SOB, DOE, cough, chest tightness, and wheezing.  Cardiovascular: admits to  leg swelling.  Gastrointestinal: denies nausea, vomiting, abdominal pain, diarrhea, constipation, blood in stool.  Genitourinary: denies dysuria, urgency, frequency, hematuria, flank pain and difficulty urinating.  Musculoskeletal: denies  myalgias, back pain, joint swelling, arthralgias and gait problem.   Skin: denies pallor, rash and wound.  Neurological: denies dizziness, seizures, syncope, weakness, light-headedness, numbness and headaches.   Hematological: denies adenopathy, easy bruising, personal or family bleeding history.  Psychiatric/ Behavioral: denies suicidal ideation, mood changes, confusion, nervousness, sleep disturbance and agitation.       All other systems are reviewed and negative.    PHYSICAL EXAM: VS:  BP 124/74 mmHg  Pulse 80  Ht  (1.702 m)  Wt 176 lb 1.9 oz (79.888 kg)  BMI 27.58 kg/m2 , BMI Body mass index is 27.58 kg/(m^2). GEN: Well nourished,  well developed, in no acute distress HEENT: normal Neck: no JVD, carotid bruits, or masses Cardiac: RRR; 2-3/6 systolic murmur, no  rubs, or gallops,no edema  Respiratory:  clear to auscultation bilaterally, normal work of breathing GI: soft, nontender, nondistended, + BS MS: no deformity or atrophy Skin: warm and dry, no rash Neuro:  Strength and sensation are intact Psych: normal   EKG:  EKG is ordered today. The ekg ordered today demonstrates  NSR at 80.   NS ST /T abn.    Recent Labs: 11/16/2014: ALT 5 01/25/2015: BUN 32*; Creatinine, Ser 1.16; Hemoglobin 12.8; Platelets 217.0; Potassium 4.7; Pro B Natriuretic peptide (BNP) 68.0; Sodium 138; TSH 2.02    Lipid Panel    Component Value Date/Time   CHOL 145 09/15/2014 1516   TRIG 85 09/15/2014 1516   HDL 71 09/15/2014 1516   CHOLHDL 2.0 09/15/2014 1516   LDLCALC 57 09/15/2014 1516      Wt Readings from Last 3 Encounters:  06/04/15 176 lb 1.9 oz (79.888 kg)  05/10/15 175 lb (79.379 kg)  04/07/15 170 lb (77.111 kg)      Other studies Reviewed: Additional studies/ records that were reviewed today include: . Review of the above records demonstrates:    ASSESSMENT AND PLAN:  1.  Aortic stenosis:  Tacora Athanas has had aortic stenosis for years. Her echocardiogram in 2013 revealed that she had moderate aortic stenosis. She had a recent echo for further evaluation of shortness of breath and leg swelling was found have severe aortic stenosis. Unfortunately she has also developed some significant memory impairment and has developed dementia. She now has a power of attorney who makes most of her decisions for  her.  She has appointments to see an neurologist and also psychologist in the near future. To make things more complicated, she is planning on moving to [place], [place] later this summer.  In my opinion I think that she would physically be able to go through a TAVR procedure but I'm concerned  about her  mental capabilities. I don't think it makes much sense to perform a TAVR on someone who has progressive decline in their mental capacity.  Right now she is very comfortable at rest. She does have some shortness of breath with exertion but I think that we can wait and see how she does over the next several months.  She denies any chest pain or syncope.    I've set her up an appointment to see me in 6 months.     She may be down in [place] by that time. We will forward our  Notes to her new  cardiologist  in the [place] area.  I've encouraged her to call me if she has any worsening problems and needs to be seen sooner.   Current medicines are reviewed at length with the patient today.  The patient does not have concerns regarding medicines.  The following changes have been made:  no change  Labs/ tests ordered today include:  No orders of the defined types were placed in this encounter.     Disposition:   FU with me in 6 months      Lashica Hannay, Deloris Ping, MD  06/04/2015 2:44 PM    [facility] [address] [place], [place], [place]  [address] Phone: [phone]; Fax: [phone]   [facility]  [address] [place], [place]  [address] [phone]   Fax [phone]

## 2015-06-10 NOTE — Telephone Encounter (Signed)
Patient seen cardiology on 06-04-15

## 2015-06-15 ENCOUNTER — Ambulatory Visit: Payer: Medicare Other | Admitting: Internal Medicine

## 2015-06-16 ENCOUNTER — Ambulatory Visit: Payer: Medicare Other | Admitting: Internal Medicine

## 2015-06-23 ENCOUNTER — Telehealth: Payer: Self-pay | Admitting: Neurology

## 2015-06-23 NOTE — Telephone Encounter (Signed)
Patient called and spoke with Practice Admin. To reschedule September appointment with Dr. Roda ShuttersXu. Patient called 4/19 2:47pm, appointment rescheduled to Thursday June 15th 10:30am, patient advised to arrive 10:15am. Patient states she had a good meeting with Dr. Roda ShuttersXu last time and mentioned to him that she would like to get some good reading material if he has any to share with her. Patient is still interested in obtaining interesting reading material related to medical topics. 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

## 2015-06-23 NOTE — Telephone Encounter (Signed)
Pt called said she had been talking with Marylu LundJanet B. Regarding an appointment. She stepped out for awhile and had a message left on her phone, she didn't know when it was left but she is back in now. She said thank you for your time in this matter and hung up. I did not get an opportunity to ask anything.

## 2015-06-23 NOTE — Telephone Encounter (Signed)
Rn call patients HCPOA Clarisse about patient calling to change her appt. Clarisse stated she has been out of town for two [place],and was unaware she change the appointment. Clarisse stated pt will be moving to [place] where her son has reside. Pt has other health problems that Clarisse does not feel comfortable with, and wants pts son to make the final decision. Clarisse wrote down the appt for June 2017 with Dr. Roda ShuttersXu. Pt will be seeing Dr.Zelson in May 2017 for neuropsychiatry testing. Clarisse stated she and the pts son both her HCPOA. They do not have a place of where she will be residing. They will be [place] all [facility] [place] [facility] [place] nearby. Clarisse appreciate a call from [facility].

## 2015-07-02 ENCOUNTER — Ambulatory Visit
Admission: RE | Admit: 2015-07-02 | Discharge: 2015-07-02 | Disposition: A | Discharge status: Home / Self Care | Payer: Medicare Other | Source: Ambulatory Visit | Attending: Internal Medicine | Admitting: Internal Medicine

## 2015-07-02 DIAGNOSIS — M5442 Lumbago with sciatica, left side: Secondary | ICD-10-CM

## 2015-07-02 DIAGNOSIS — M549 Dorsalgia, unspecified: Secondary | ICD-10-CM | POA: Diagnosis not present

## 2015-07-02 MED ORDER — METHYLPREDNISOLONE ACETATE 40 MG/ML INJ SUSP (RADIOLOG
120.0000 mg | Freq: Once | INTRAMUSCULAR | Status: AC
  Administered 2015-07-02: 120 mg via EPIDURAL

## 2015-07-02 MED ORDER — IOPAMIDOL (ISOVUE-M 200) INJECTION 41%
1.0000 mL | Freq: Once | INTRAMUSCULAR | Status: AC
  Administered 2015-07-02: 1 mL via EPIDURAL

## 2015-07-02 NOTE — Discharge Instructions (Signed)

## 2015-07-12 ENCOUNTER — Telehealth: Payer: Self-pay | Admitting: Psychology

## 2015-07-12 NOTE — Telephone Encounter (Signed)
Hi, Dr. Leonides CaveZelson:   That is great. Thank you so much for your help.  Jocelyn Nold

## 2015-07-12 NOTE — Telephone Encounter (Signed)
Ms. Christine Pierce had called my office last week to cancel her appointment on 07/14/15 for neuropsychological evaluation. She did not wish to reschedule. Today her medical POA, Ms. Christine Pierce, called me to re-schedule her. I scheduled her for my earliest opening on 08/26/15.

## 2015-07-13 ENCOUNTER — Ambulatory Visit: Payer: Medicare Other | Admitting: Internal Medicine

## 2015-07-14 ENCOUNTER — Ambulatory Visit: Payer: Medicare Other | Admitting: Internal Medicine

## 2015-07-14 ENCOUNTER — Ambulatory Visit: Payer: Medicare Other | Admitting: Psychology

## 2015-07-15 ENCOUNTER — Ambulatory Visit (HOSPITAL_COMMUNITY): Payer: Medicare Other | Admitting: Psychiatry

## 2015-08-03 ENCOUNTER — Telehealth: Payer: Self-pay | Admitting: Neurology

## 2015-08-03 NOTE — Telephone Encounter (Signed)
Message For: APPT CXL             Taken 30-MAY-17 at  2:16PM by JAK ------------------------------------------------------------  Caller  CLARICE/POWER OF ATTORNEY   CID  [id number]   Patient  Christine PomfretMARY [facility]ANE [facility]     Pt's Dr  Roda ShuttersXU            Area Code  288  Phone#  [phone]     *  DOB  [birth year]       RE  CXL 6/15TH AT 10:30PM---PATIENT MOVING TO CARE    CARE FACILITY IN [place]/WILL NO LONGER SEE DR   Disp:Y/N  N  If Y = C/B If No Response In 20minutes  ============================================================  appt cancelled

## 2015-08-04 ENCOUNTER — Encounter: Payer: Self-pay | Admitting: Internal Medicine

## 2015-08-04 ENCOUNTER — Ambulatory Visit (INDEPENDENT_AMBULATORY_CARE_PROVIDER_SITE_OTHER): Payer: Medicare Other | Admitting: Internal Medicine

## 2015-08-04 VITALS — BP 132/66 | HR 70 | Temp 98.1°F | Ht 67.0 in | Wt 180.0 lb

## 2015-08-04 DIAGNOSIS — G4700 Insomnia, unspecified: Secondary | ICD-10-CM | POA: Insufficient documentation

## 2015-08-04 DIAGNOSIS — F429 Obsessive-compulsive disorder, unspecified: Secondary | ICD-10-CM | POA: Diagnosis not present

## 2015-08-04 DIAGNOSIS — R531 Weakness: Secondary | ICD-10-CM

## 2015-08-04 DIAGNOSIS — R1012 Left upper quadrant pain: Secondary | ICD-10-CM | POA: Diagnosis not present

## 2015-08-04 DIAGNOSIS — N183 Chronic kidney disease, stage 3 (moderate): Secondary | ICD-10-CM | POA: Diagnosis not present

## 2015-08-04 DIAGNOSIS — I10 Essential (primary) hypertension: Secondary | ICD-10-CM | POA: Diagnosis not present

## 2015-08-04 DIAGNOSIS — M25512 Pain in left shoulder: Secondary | ICD-10-CM

## 2015-08-04 DIAGNOSIS — E1122 Type 2 diabetes mellitus with diabetic chronic kidney disease: Secondary | ICD-10-CM

## 2015-08-04 DIAGNOSIS — R350 Frequency of micturition: Secondary | ICD-10-CM | POA: Diagnosis not present

## 2015-08-04 DIAGNOSIS — K921 Melena: Secondary | ICD-10-CM | POA: Diagnosis not present

## 2015-08-04 DIAGNOSIS — E785 Hyperlipidemia, unspecified: Secondary | ICD-10-CM | POA: Diagnosis not present

## 2015-08-04 DIAGNOSIS — M5442 Lumbago with sciatica, left side: Secondary | ICD-10-CM

## 2015-08-04 DIAGNOSIS — I350 Nonrheumatic aortic (valve) stenosis: Secondary | ICD-10-CM | POA: Diagnosis not present

## 2015-08-04 DIAGNOSIS — R0600 Dyspnea, unspecified: Secondary | ICD-10-CM

## 2015-08-04 HISTORY — DX: Pain in left shoulder: M25.512

## 2015-08-04 HISTORY — DX: Melena: K92.1

## 2015-08-04 NOTE — Progress Notes (Signed)
Patient ID: Christine Pierce, female   DOB: [birth year], 82 y.o.   MRN: [id number]    Facility  [facility]    Place of Service:   OFFICE    Allergies  Allergen Reactions  . Macrodantin [Nitrofurantoin Macrocrystal] Other (See Comments)    Affects kidneys  . Proventil [Albuterol] Other (See Comments)    Patient unable to coordinate use of inhaler    Chief Complaint  Patient presents with  . Medical Management of Chronic Issues    3 month medication management blood sugar, CKD, blood pressure, anxiety. Here with friend Clarisse  . left side pain    worse then it was last visit  . Blood In Stools    2 days ago, not now  . Shoulder Pain    left shoulder  . Urinary Frequency    up every 2-3 hours at night, no problem durning the day  . Weakness    dizziness when she trys to use cane, no problem when she use the walker  . FL2 Form    moving to [place], [place] to [place] June 30th  . Insomnia    Problems getting back to sleep    HPI:   Planning a move to [place], [place] to [place] on September 03, 2015, an AL facility. It will be closer to her son.  Aortic stenosis - most likely associated with her dyspnea on exertion. Ha seen the cardiologist. Does not consider her a good candidate for TAVR.  Dyspnea - Most likely associated with her severe aortic stenosis  Essential hypertension - controlled  Controlled type 2 diabetes mellitus with stage 3 chronic kidney disease, without long-term current use of insulin ([place]) - follow lab  Hematochezia - multiple episodes of smearing of blood in the toilet and on toilet paper.  Pain, abdominal, LUQ - persistent complaints of left sided abdominal discomfort  Pain, joint, shoulder region, left - persistent complaints of pain in the left shoulder area and arm. Insertion of the left deltoid muscle seems to be the most tender area.  Urine frequency - denies dysuria. Frequency interferes with sleep at night.  Weak - persistent issues of  fatigue and weakness  Insomnia - she believes related to the problems of urinary frequency at night  Obsessive compulsive disorder - patient has a tendency to dwell on things and become upset when answers are not readily apparent.  HLD (hyperlipidemia) - follow Lipid panel  Left-sided low back pain with left-sided sciatica - continues to complain low back discomfort and left leg discomfort.    Medications: Patient's Medications  New Prescriptions   No medications on file  Previous Medications   ACETAMINOPHEN (TYLENOL 8 HOUR ARTHRITIS PAIN) 650 MG CR TABLET    Take 650 mg by mouth 3 (three) times daily. For arthritis pain   AMLODIPINE (NORVASC) 5 MG TABLET    One daily to control BP   CALCITONIN, SALMON, (MIACALCIN/FORTICAL) 200 UNIT/ACT NASAL SPRAY    Place 1 spray into alternate nostrils every other day.    FAMOTIDINE (PEPCID) 20 MG TABLET    One at bedtime   GUAIFENESIN (MUCINEX) 600 MG 12 HR TABLET    Take 600 mg by mouth daily as needed for cough or to loosen phlegm.    LIDOCAINE (ASPERCREME LIDOCAINE) 4 % PTCH    Apply 1 patch topically every morning. To the back to relieve pain   LOSARTAN (COZAAR) 50 MG TABLET    Take 50 mg by mouth daily.  MELATONIN 3 MG CAPS    Take 3 mg by mouth daily.   NITROGLYCERIN (NITROSTAT) 0.4 MG SL TABLET    Place 0.4 mg under the tongue every 5 (five) minutes as needed for chest pain.   PANTOPRAZOLE (PROTONIX) 40 MG TABLET    Take 1 tablet (40 mg total) by mouth daily. Take 30-60 min before first meal of the day   SENNA-DOCUSATE (SENOKOT-S) 8.6-50 MG PER TABLET    One at bed to prevent constipation  Modified Medications   No medications on file  Discontinued Medications   No medications on file    Review of Systems  Constitutional: Positive for fatigue. Negative for fever, chills, diaphoresis, activity change, appetite change and unexpected weight change.  HENT: Positive for hearing loss.   Eyes: Negative.   Respiratory: Positive for cough and  shortness of breath.        Complains of a hoarse voice for the last few months. There is no pain.  Cardiovascular: Positive for leg swelling. Negative for palpitations.  Gastrointestinal: Positive for constipation and rectal pain.       Occasional fecal incontinence. Sometimes has difficulty swallowing. Solids and liquids seem to be involved. There is no odynophagia.  Endocrine:       Elevated blood sugars. Currently on "dietary control". She is not following her diet closely.  Genitourinary: Positive for urgency and frequency. Negative for dysuria, decreased urine volume, vaginal bleeding, vaginal discharge, enuresis, genital sores, vaginal pain and pelvic pain.       UTI 02/17/14. Incontinent of urine. Nocturia x 4-5  Musculoskeletal:       Generalized weakness: Pain in the right hip area. Requires assistance in getting up and down. At risk for falls. Pain in the right leg between the knee and ankle close to a previous scar.  Skin: Negative.   Allergic/Immunologic: Negative.   Neurological: Positive for weakness.       Memory loss.  Hematological: Negative.   Psychiatric/Behavioral: Positive for confusion. The patient is nervous/anxious.        History of hoarding behavior.Past history of impulsive buying of products on television.    Filed Vitals:   08/04/15 1226  BP: 132/66  Pulse: 70  Temp: 98.1 F (36.7 C)  TempSrc: Oral  Height: '5\' 7"'  (1.702 m)  Weight: 180 lb (81.647 kg)  SpO2: 96%   Body mass index is 28.19 kg/(m^2). Filed Weights   08/04/15 1226  Weight: 180 lb (81.647 kg)     Physical Exam  Constitutional: She is oriented to person, place, and time. No distress.  Overweight  HENT:  Head: Atraumatic.  Partial deafness. Uses hearing aids bilaterally. Complains of left ear pain.  Eyes:  Wears prescription lenses.  Neck: No JVD present. No tracheal deviation present. No thyromegaly present.  Some tenderness with movement laterally to either side.    Cardiovascular: Normal rate and regular rhythm.  Exam reveals no friction rub.   Murmur (3/6 SEM at LSB and aortic ejection) heard. Bilateral varicose veins  Pulmonary/Chest: No respiratory distress. She has no wheezes. She has no rales. She exhibits tenderness (left lower chest wall).  Tender left flank area lower rib margins laterally and slightly anteriorly. Voice is slightly hoarse.  Abdominal: Soft. Bowel sounds are normal. She exhibits no distension and no mass. There is no tenderness.  Genitourinary: Guaiac negative stool.  Anal fissure at 6 o'clock  Musculoskeletal: Normal range of motion. She exhibits no edema or tenderness.  Tender the greater trochanter and  right groin. She is hobbled by a decrease in her stride length. Low back pains, more on the left side. Pain in the lower right leg close to a previous scar  Lymphadenopathy:    She has no cervical adenopathy.  Neurological: She is alert and oriented to person, place, and time. No cranial nerve deficit. She exhibits normal muscle tone. Coordination normal.  04/30/12 MMSE 27/30. Passed clock drawing. Having some trouble in sequencing events and in telling a coherent story.  Insensitive to vibration in the feet.  Skin: No rash noted. She is not diaphoretic. No erythema. No pallor.  Psychiatric: She has a normal mood and affect. Her behavior is normal. Judgment and thought content normal.    Labs reviewed: Lab Summary Latest Ref Rng 01/25/2015 11/16/2014  Hemoglobin 12.0 - 15.0 g/dL 12.8 12.1  Hematocrit 36.0 - 46.0 % 39.6 37.0  White count 4.0 - 10.5 K/uL 9.4 5.4  Platelet count 150.0 - 400.0 K/uL 217.0 (None)  Sodium 135 - 145 mEq/L 138 140  Potassium 3.5 - 5.1 mEq/L 4.7 4.9  Calcium 8.4 - 10.5 mg/dL 10.8(H) 10.6(H)  Phosphorus - (None) (None)  Creatinine 0.40 - 1.20 mg/dL 1.16 1.22(H)  AST 0 - 40 IU/L (None) 10  Alk Phos 39 - 117 IU/L (None) 78  Bilirubin 0.0 - 1.2 mg/dL (None) 0.4  Glucose 70 - 99 mg/dL 100(H) 94   Cholesterol - (None) (None)  HDL cholesterol - (None) (None)  Triglycerides - (None) (None)  LDL Direct - (None) (None)  LDL Calc - (None) (None)  Total protein - (None) (None)  Albumin 3.2 - 4.6 g/dL (None) 4.5   Lab Results  Component Value Date   TSH 2.02 01/25/2015   TSH 1.43 12/01/2013   TSH 1.150 03/26/2013   Lab Results  Component Value Date   BUN 32* 01/25/2015   BUN 27 11/16/2014   BUN 27 09/15/2014   Lab Results  Component Value Date   HGBA1C 6.0* 09/15/2014   HGBA1C 6.2* 01/19/2014   HGBA1C 6.1* 07/18/2013    Assessment/Plan   1. Aortic stenosis Although this problem is significant enough that it may be creating some debility with exhaustion, fatigue, and dyspnea on exertion, there seems to be no good solution.  2. Dyspnea Most likely related to her severe aortic stenosis  3. Essential hypertension Continue current medication - CMP  4. Controlled type 2 diabetes mellitus with stage 3 chronic kidney disease, without long-term current use of insulin ([facility]) Continue current medication - Hemoglobin A1c  5. Hematochezia - Cologard - CBC with Differential/Platelet  6. Pain, abdominal, LUQ -Cologuard  7. Pain, joint, shoulder region, left Tylenol prn  8. Urine frequency - Urinalysis - Urine culture  9. Weak May be multifactorial or related to her aortic stenosis -CBC, CMP  10. Insomnia Some of this may be related to her urinary frequency at night. Urinalysis and culture are to be obtained  11. Obsessive compulsive disorder Continue to monitor  12. HLD (hyperlipidemia) - Lipid panel  13. Left-sided low back pain with left-sided sciatica observe

## 2015-08-05 LAB — CBC WITH DIFFERENTIAL/PLATELET
Basophils Absolute: 0 10*3/uL (ref 0.0–0.2)
Basos: 1 %
EOS (ABSOLUTE): 0.1 10*3/uL (ref 0.0–0.4)
EOS: 2 %
HEMATOCRIT: 36.9 % (ref 34.0–46.6)
HEMOGLOBIN: 12.1 g/dL (ref 11.1–15.9)
IMMATURE GRANS (ABS): 0 10*3/uL (ref 0.0–0.1)
Immature Granulocytes: 0 %
LYMPHS ABS: 1.4 10*3/uL (ref 0.7–3.1)
Lymphs: 26 %
MCH: 30 pg (ref 26.6–33.0)
MCHC: 32.8 g/dL (ref 31.5–35.7)
MCV: 91 fL (ref 79–97)
MONOCYTES: 10 %
Monocytes Absolute: 0.5 10*3/uL (ref 0.1–0.9)
NEUTROS ABS: 3.2 10*3/uL (ref 1.4–7.0)
Neutrophils: 61 %
Platelets: 248 10*3/uL (ref 150–379)
RBC: 4.04 x10E6/uL (ref 3.77–5.28)
RDW: 14.5 % (ref 12.3–15.4)
WBC: 5.2 10*3/uL (ref 3.4–10.8)

## 2015-08-05 LAB — COMPREHENSIVE METABOLIC PANEL
ALK PHOS: 83 IU/L (ref 39–117)
ALT: 7 IU/L (ref 0–32)
AST: 12 IU/L (ref 0–40)
Albumin/Globulin Ratio: 1.8 (ref 1.2–2.2)
Albumin: 4.4 g/dL (ref 3.2–4.6)
BILIRUBIN TOTAL: 0.3 mg/dL (ref 0.0–1.2)
BUN / CREAT RATIO: 20 (ref 12–28)
BUN: 23 mg/dL (ref 10–36)
CALCIUM: 10.2 mg/dL (ref 8.7–10.3)
CO2: 23 mmol/L (ref 18–29)
Chloride: 103 mmol/L (ref 96–106)
Creatinine, Ser: 1.14 mg/dL — ABNORMAL HIGH (ref 0.57–1.00)
GFR calc Af Amer: 49 mL/min/{1.73_m2} — ABNORMAL LOW (ref >59)
GFR calc non Af Amer: 42 mL/min/{1.73_m2} — ABNORMAL LOW (ref >59)
Globulin, Total: 2.4 g/dL (ref 1.5–4.5)
Glucose: 101 mg/dL — ABNORMAL HIGH (ref 65–99)
POTASSIUM: 5.4 mmol/L — AB (ref 3.5–5.2)
SODIUM: 141 mmol/L (ref 134–144)
Total Protein: 6.8 g/dL (ref 6.0–8.5)

## 2015-08-05 LAB — LIPID PANEL
CHOL/HDL RATIO: 3.2 ratio (ref 0.0–4.4)
Cholesterol, Total: 218 mg/dL — ABNORMAL HIGH (ref 100–199)
HDL: 69 mg/dL (ref >39)
LDL Calculated: 121 mg/dL — ABNORMAL HIGH (ref 0–99)
Triglycerides: 140 mg/dL (ref 0–149)
VLDL Cholesterol Cal: 28 mg/dL (ref 5–40)

## 2015-08-05 LAB — URINALYSIS
BILIRUBIN UA: NEGATIVE
Glucose, UA: NEGATIVE
Ketones, UA: NEGATIVE
Nitrite, UA: POSITIVE — AB
PROTEIN UA: NEGATIVE
RBC, UA: NEGATIVE
SPEC GRAV UA: 1.023 (ref 1.005–1.030)
UUROB: 0.2 mg/dL (ref 0.2–1.0)
pH, UA: 6 (ref 5.0–7.5)

## 2015-08-05 LAB — HEMOGLOBIN A1C
ESTIMATED AVERAGE GLUCOSE: 128 mg/dL
HEMOGLOBIN A1C: 6.1 % — AB (ref 4.8–5.6)

## 2015-08-11 ENCOUNTER — Telehealth: Payer: Self-pay

## 2015-08-11 DIAGNOSIS — R1012 Left upper quadrant pain: Secondary | ICD-10-CM

## 2015-08-11 DIAGNOSIS — R350 Frequency of micturition: Secondary | ICD-10-CM

## 2015-08-11 NOTE — Telephone Encounter (Signed)
Dr. Chilton SiGreen received urine culture report from 08/04/15, Comments: greater than 2 organisms recovered, none predominant. Please submit another sample if clinically indicated. Greater than 100,000 colony forming units per mL. Left message on patients voice mail that she needs to come to the office to give another urine sample to repeat urine culture. Order put into computer

## 2015-08-19 ENCOUNTER — Ambulatory Visit: Payer: Medicare Other | Admitting: Neurology

## 2015-08-20 ENCOUNTER — Ambulatory Visit: Payer: Medicare Other | Admitting: Psychology

## 2015-08-26 ENCOUNTER — Encounter: Payer: Medicare Other | Admitting: Psychology

## 2015-09-01 ENCOUNTER — Telehealth: Payer: Self-pay

## 2015-09-01 ENCOUNTER — Other Ambulatory Visit: Payer: Medicare Other

## 2015-09-01 DIAGNOSIS — R1012 Left upper quadrant pain: Secondary | ICD-10-CM

## 2015-09-01 DIAGNOSIS — R350 Frequency of micturition: Secondary | ICD-10-CM | POA: Diagnosis not present

## 2015-09-01 NOTE — Telephone Encounter (Signed)
Patient on fluvoxaMINE (LUVOX) 100 MG tablet off/on. Patient's caregiver would like for patient to remain on this medication. Clarisse is not sure why this medication is not on her medication list, patient back on medication since May 4,2017. If Dr.Green agrees that patient should remain on antidepressant this needs to be added back to her medication list, medication list to be printed, and  signed by Dr.Green. Medication list to be  [facility] @ [phone], phone number is [phone]. Attention Ludwig Clarksathy Hansen    Please advise

## 2015-09-01 NOTE — Telephone Encounter (Signed)
Medication added, list printed, signed by Dr.Green and faxed to provided number

## 2015-09-01 NOTE — Telephone Encounter (Signed)
It is OK to renew the Luvox 100 mg daily

## 2015-09-03 LAB — URINE CULTURE

## 2015-09-15 DIAGNOSIS — I10 Essential (primary) hypertension: Secondary | ICD-10-CM | POA: Diagnosis not present

## 2015-09-15 DIAGNOSIS — I38 Endocarditis, valve unspecified: Secondary | ICD-10-CM | POA: Diagnosis not present

## 2015-09-15 DIAGNOSIS — N3281 Overactive bladder: Secondary | ICD-10-CM | POA: Diagnosis not present

## 2015-09-15 DIAGNOSIS — F320 Major depressive disorder, single episode, mild: Secondary | ICD-10-CM | POA: Diagnosis not present

## 2015-09-15 LAB — URINE CULTURE

## 2015-09-22 DIAGNOSIS — M79675 Pain in left toe(s): Secondary | ICD-10-CM | POA: Diagnosis not present

## 2015-09-22 DIAGNOSIS — R1084 Generalized abdominal pain: Secondary | ICD-10-CM | POA: Diagnosis not present

## 2015-10-13 DIAGNOSIS — R1084 Generalized abdominal pain: Secondary | ICD-10-CM | POA: Diagnosis not present

## 2015-10-14 DIAGNOSIS — R1084 Generalized abdominal pain: Secondary | ICD-10-CM | POA: Diagnosis not present

## 2015-10-15 DIAGNOSIS — F329 Major depressive disorder, single episode, unspecified: Secondary | ICD-10-CM | POA: Diagnosis not present

## 2015-10-15 DIAGNOSIS — R109 Unspecified abdominal pain: Secondary | ICD-10-CM | POA: Diagnosis not present

## 2015-10-20 DIAGNOSIS — N200 Calculus of kidney: Secondary | ICD-10-CM | POA: Diagnosis not present

## 2015-10-20 DIAGNOSIS — I10 Essential (primary) hypertension: Secondary | ICD-10-CM | POA: Diagnosis not present

## 2015-10-20 DIAGNOSIS — I38 Endocarditis, valve unspecified: Secondary | ICD-10-CM | POA: Diagnosis not present

## 2015-10-20 DIAGNOSIS — M25562 Pain in left knee: Secondary | ICD-10-CM | POA: Diagnosis not present

## 2015-11-03 DIAGNOSIS — R300 Dysuria: Secondary | ICD-10-CM | POA: Diagnosis not present

## 2015-11-04 DIAGNOSIS — N390 Urinary tract infection, site not specified: Secondary | ICD-10-CM | POA: Diagnosis not present

## 2015-11-10 ENCOUNTER — Ambulatory Visit: Payer: Medicare Other | Admitting: Neurology

## 2015-11-17 DIAGNOSIS — R05 Cough: Secondary | ICD-10-CM | POA: Diagnosis not present

## 2015-11-17 DIAGNOSIS — M5440 Lumbago with sciatica, unspecified side: Secondary | ICD-10-CM | POA: Diagnosis not present

## 2015-11-17 DIAGNOSIS — R0602 Shortness of breath: Secondary | ICD-10-CM | POA: Diagnosis not present

## 2015-11-17 DIAGNOSIS — M25562 Pain in left knee: Secondary | ICD-10-CM | POA: Diagnosis not present

## 2015-11-17 DIAGNOSIS — N200 Calculus of kidney: Secondary | ICD-10-CM | POA: Diagnosis not present

## 2015-11-18 DIAGNOSIS — R0602 Shortness of breath: Secondary | ICD-10-CM | POA: Diagnosis not present

## 2015-11-18 DIAGNOSIS — I38 Endocarditis, valve unspecified: Secondary | ICD-10-CM | POA: Diagnosis not present

## 2015-11-18 DIAGNOSIS — M5440 Lumbago with sciatica, unspecified side: Secondary | ICD-10-CM | POA: Diagnosis not present

## 2015-11-18 DIAGNOSIS — G8929 Other chronic pain: Secondary | ICD-10-CM | POA: Diagnosis not present

## 2015-11-19 DIAGNOSIS — N200 Calculus of kidney: Secondary | ICD-10-CM | POA: Diagnosis not present

## 2015-12-03 DIAGNOSIS — Z87442 Personal history of urinary calculi: Secondary | ICD-10-CM | POA: Diagnosis not present

## 2015-12-03 DIAGNOSIS — I272 Other secondary pulmonary hypertension: Secondary | ICD-10-CM | POA: Diagnosis not present

## 2015-12-03 DIAGNOSIS — R002 Palpitations: Secondary | ICD-10-CM | POA: Diagnosis not present

## 2015-12-03 DIAGNOSIS — J449 Chronic obstructive pulmonary disease, unspecified: Secondary | ICD-10-CM | POA: Diagnosis present

## 2015-12-03 DIAGNOSIS — I4891 Unspecified atrial fibrillation: Secondary | ICD-10-CM | POA: Diagnosis not present

## 2015-12-03 DIAGNOSIS — B9620 Unspecified Escherichia coli [E. coli] as the cause of diseases classified elsewhere: Secondary | ICD-10-CM | POA: Diagnosis present

## 2015-12-03 DIAGNOSIS — I484 Atypical atrial flutter: Secondary | ICD-10-CM | POA: Diagnosis not present

## 2015-12-03 DIAGNOSIS — R319 Hematuria, unspecified: Secondary | ICD-10-CM | POA: Diagnosis present

## 2015-12-03 DIAGNOSIS — I10 Essential (primary) hypertension: Secondary | ICD-10-CM | POA: Diagnosis not present

## 2015-12-03 DIAGNOSIS — Z888 Allergy status to other drugs, medicaments and biological substances status: Secondary | ICD-10-CM | POA: Diagnosis not present

## 2015-12-03 DIAGNOSIS — R1012 Left upper quadrant pain: Secondary | ICD-10-CM | POA: Diagnosis not present

## 2015-12-03 DIAGNOSIS — I350 Nonrheumatic aortic (valve) stenosis: Secondary | ICD-10-CM | POA: Diagnosis not present

## 2015-12-03 DIAGNOSIS — N183 Chronic kidney disease, stage 3 (moderate): Secondary | ICD-10-CM | POA: Diagnosis present

## 2015-12-03 DIAGNOSIS — R7989 Other specified abnormal findings of blood chemistry: Secondary | ICD-10-CM | POA: Diagnosis not present

## 2015-12-03 DIAGNOSIS — R000 Tachycardia, unspecified: Secondary | ICD-10-CM | POA: Diagnosis not present

## 2015-12-03 DIAGNOSIS — I083 Combined rheumatic disorders of mitral, aortic and tricuspid valves: Secondary | ICD-10-CM | POA: Diagnosis not present

## 2015-12-03 DIAGNOSIS — N3001 Acute cystitis with hematuria: Secondary | ICD-10-CM | POA: Diagnosis not present

## 2015-12-03 DIAGNOSIS — R410 Disorientation, unspecified: Secondary | ICD-10-CM | POA: Diagnosis not present

## 2015-12-03 DIAGNOSIS — N390 Urinary tract infection, site not specified: Secondary | ICD-10-CM | POA: Diagnosis present

## 2015-12-03 DIAGNOSIS — I214 Non-ST elevation (NSTEMI) myocardial infarction: Secondary | ICD-10-CM | POA: Diagnosis not present

## 2015-12-03 DIAGNOSIS — Z79899 Other long term (current) drug therapy: Secondary | ICD-10-CM | POA: Diagnosis not present

## 2015-12-03 DIAGNOSIS — I519 Heart disease, unspecified: Secondary | ICD-10-CM | POA: Diagnosis not present

## 2015-12-03 DIAGNOSIS — R0600 Dyspnea, unspecified: Secondary | ICD-10-CM | POA: Diagnosis not present

## 2015-12-03 DIAGNOSIS — F418 Other specified anxiety disorders: Secondary | ICD-10-CM | POA: Diagnosis present

## 2015-12-03 DIAGNOSIS — R0602 Shortness of breath: Secondary | ICD-10-CM | POA: Diagnosis not present

## 2015-12-03 DIAGNOSIS — I129 Hypertensive chronic kidney disease with stage 1 through stage 4 chronic kidney disease, or unspecified chronic kidney disease: Secondary | ICD-10-CM | POA: Diagnosis present

## 2015-12-03 DIAGNOSIS — I4892 Unspecified atrial flutter: Secondary | ICD-10-CM | POA: Diagnosis not present

## 2015-12-03 DIAGNOSIS — I517 Cardiomegaly: Secondary | ICD-10-CM | POA: Diagnosis not present

## 2015-12-03 DIAGNOSIS — G894 Chronic pain syndrome: Secondary | ICD-10-CM | POA: Diagnosis present

## 2015-12-03 DIAGNOSIS — E1122 Type 2 diabetes mellitus with diabetic chronic kidney disease: Secondary | ICD-10-CM | POA: Diagnosis present

## 2015-12-03 DIAGNOSIS — M810 Age-related osteoporosis without current pathological fracture: Secondary | ICD-10-CM | POA: Diagnosis present

## 2015-12-03 DIAGNOSIS — K210 Gastro-esophageal reflux disease with esophagitis: Secondary | ICD-10-CM | POA: Diagnosis not present

## 2015-12-08 DIAGNOSIS — G894 Chronic pain syndrome: Secondary | ICD-10-CM | POA: Diagnosis not present

## 2015-12-08 DIAGNOSIS — M5430 Sciatica, unspecified side: Secondary | ICD-10-CM | POA: Diagnosis not present

## 2015-12-08 DIAGNOSIS — I214 Non-ST elevation (NSTEMI) myocardial infarction: Secondary | ICD-10-CM | POA: Diagnosis not present

## 2015-12-08 DIAGNOSIS — I480 Paroxysmal atrial fibrillation: Secondary | ICD-10-CM | POA: Diagnosis not present

## 2015-12-14 DIAGNOSIS — G894 Chronic pain syndrome: Secondary | ICD-10-CM | POA: Diagnosis not present

## 2015-12-14 DIAGNOSIS — M5430 Sciatica, unspecified side: Secondary | ICD-10-CM | POA: Diagnosis not present

## 2015-12-16 DIAGNOSIS — M5430 Sciatica, unspecified side: Secondary | ICD-10-CM | POA: Diagnosis not present

## 2015-12-16 DIAGNOSIS — G894 Chronic pain syndrome: Secondary | ICD-10-CM | POA: Diagnosis not present

## 2015-12-20 DIAGNOSIS — Z23 Encounter for immunization: Secondary | ICD-10-CM | POA: Diagnosis not present

## 2015-12-21 DIAGNOSIS — M5430 Sciatica, unspecified side: Secondary | ICD-10-CM | POA: Diagnosis not present

## 2015-12-21 DIAGNOSIS — G894 Chronic pain syndrome: Secondary | ICD-10-CM | POA: Diagnosis not present

## 2015-12-23 DIAGNOSIS — M5430 Sciatica, unspecified side: Secondary | ICD-10-CM | POA: Diagnosis not present

## 2015-12-23 DIAGNOSIS — G894 Chronic pain syndrome: Secondary | ICD-10-CM | POA: Diagnosis not present

## 2015-12-28 DIAGNOSIS — G894 Chronic pain syndrome: Secondary | ICD-10-CM | POA: Diagnosis not present

## 2015-12-28 DIAGNOSIS — M5430 Sciatica, unspecified side: Secondary | ICD-10-CM | POA: Diagnosis not present

## 2015-12-30 DIAGNOSIS — M5430 Sciatica, unspecified side: Secondary | ICD-10-CM | POA: Diagnosis not present

## 2015-12-30 DIAGNOSIS — G894 Chronic pain syndrome: Secondary | ICD-10-CM | POA: Diagnosis not present

## 2016-01-03 DIAGNOSIS — M5430 Sciatica, unspecified side: Secondary | ICD-10-CM | POA: Diagnosis not present

## 2016-01-03 DIAGNOSIS — G894 Chronic pain syndrome: Secondary | ICD-10-CM | POA: Diagnosis not present

## 2016-01-05 DIAGNOSIS — M5430 Sciatica, unspecified side: Secondary | ICD-10-CM | POA: Diagnosis not present

## 2016-01-05 DIAGNOSIS — G894 Chronic pain syndrome: Secondary | ICD-10-CM | POA: Diagnosis not present

## 2016-01-07 DIAGNOSIS — I10 Essential (primary) hypertension: Secondary | ICD-10-CM | POA: Diagnosis not present

## 2016-01-10 DIAGNOSIS — M5430 Sciatica, unspecified side: Secondary | ICD-10-CM | POA: Diagnosis not present

## 2016-01-10 DIAGNOSIS — G894 Chronic pain syndrome: Secondary | ICD-10-CM | POA: Diagnosis not present

## 2016-01-12 DIAGNOSIS — M5430 Sciatica, unspecified side: Secondary | ICD-10-CM | POA: Diagnosis not present

## 2016-01-12 DIAGNOSIS — G894 Chronic pain syndrome: Secondary | ICD-10-CM | POA: Diagnosis not present

## 2016-01-17 DIAGNOSIS — M5430 Sciatica, unspecified side: Secondary | ICD-10-CM | POA: Diagnosis not present

## 2016-01-17 DIAGNOSIS — G894 Chronic pain syndrome: Secondary | ICD-10-CM | POA: Diagnosis not present

## 2016-01-19 DIAGNOSIS — M5430 Sciatica, unspecified side: Secondary | ICD-10-CM | POA: Diagnosis not present

## 2016-01-19 DIAGNOSIS — G894 Chronic pain syndrome: Secondary | ICD-10-CM | POA: Diagnosis not present

## 2016-01-21 DIAGNOSIS — I10 Essential (primary) hypertension: Secondary | ICD-10-CM | POA: Diagnosis not present

## 2016-01-21 DIAGNOSIS — N390 Urinary tract infection, site not specified: Secondary | ICD-10-CM | POA: Diagnosis not present

## 2016-01-26 DIAGNOSIS — N390 Urinary tract infection, site not specified: Secondary | ICD-10-CM | POA: Diagnosis not present

## 2016-03-09 DIAGNOSIS — I480 Paroxysmal atrial fibrillation: Secondary | ICD-10-CM | POA: Diagnosis not present

## 2016-03-09 DIAGNOSIS — M5440 Lumbago with sciatica, unspecified side: Secondary | ICD-10-CM | POA: Diagnosis not present

## 2016-03-15 DIAGNOSIS — R1084 Generalized abdominal pain: Secondary | ICD-10-CM | POA: Diagnosis not present

## 2016-03-17 DIAGNOSIS — I10 Essential (primary) hypertension: Secondary | ICD-10-CM | POA: Diagnosis not present

## 2016-03-22 DIAGNOSIS — L298 Other pruritus: Secondary | ICD-10-CM | POA: Diagnosis not present

## 2016-03-24 DIAGNOSIS — L299 Pruritus, unspecified: Secondary | ICD-10-CM | POA: Diagnosis not present

## 2016-04-05 DIAGNOSIS — R1084 Generalized abdominal pain: Secondary | ICD-10-CM | POA: Diagnosis not present

## 2016-04-05 DIAGNOSIS — L298 Other pruritus: Secondary | ICD-10-CM | POA: Diagnosis not present

## 2016-05-04 DIAGNOSIS — M545 Low back pain: Secondary | ICD-10-CM | POA: Diagnosis not present

## 2016-05-04 DIAGNOSIS — E119 Type 2 diabetes mellitus without complications: Secondary | ICD-10-CM | POA: Diagnosis not present

## 2016-05-04 DIAGNOSIS — I10 Essential (primary) hypertension: Secondary | ICD-10-CM | POA: Diagnosis not present

## 2016-05-04 DIAGNOSIS — N390 Urinary tract infection, site not specified: Secondary | ICD-10-CM | POA: Diagnosis not present

## 2016-05-04 DIAGNOSIS — R300 Dysuria: Secondary | ICD-10-CM | POA: Diagnosis not present

## 2016-05-05 DIAGNOSIS — E119 Type 2 diabetes mellitus without complications: Secondary | ICD-10-CM | POA: Diagnosis not present

## 2016-05-10 DIAGNOSIS — N390 Urinary tract infection, site not specified: Secondary | ICD-10-CM | POA: Diagnosis not present

## 2016-05-10 DIAGNOSIS — M5442 Lumbago with sciatica, left side: Secondary | ICD-10-CM | POA: Diagnosis not present

## 2016-05-25 DIAGNOSIS — M5442 Lumbago with sciatica, left side: Secondary | ICD-10-CM | POA: Diagnosis not present

## 2016-06-06 DIAGNOSIS — M549 Dorsalgia, unspecified: Secondary | ICD-10-CM | POA: Diagnosis not present

## 2016-06-06 DIAGNOSIS — T8332XA Displacement of intrauterine contraceptive device, initial encounter: Secondary | ICD-10-CM | POA: Diagnosis not present

## 2016-06-06 DIAGNOSIS — I214 Non-ST elevation (NSTEMI) myocardial infarction: Secondary | ICD-10-CM | POA: Diagnosis not present

## 2016-06-15 DIAGNOSIS — R195 Other fecal abnormalities: Secondary | ICD-10-CM | POA: Diagnosis not present

## 2016-06-15 DIAGNOSIS — M5442 Lumbago with sciatica, left side: Secondary | ICD-10-CM | POA: Diagnosis not present

## 2016-07-08 IMAGING — CR DG HUMERUS 2V *R*
2 series · 2 of 2 positions shown · non-contrast
Comparison: 10/10/2006

CLINICAL DATA: Fall. Right upper arm pain. History of osteoporosis.

EXAM:
RIGHT HUMERUS - 2+ VIEW

[humerus lat]
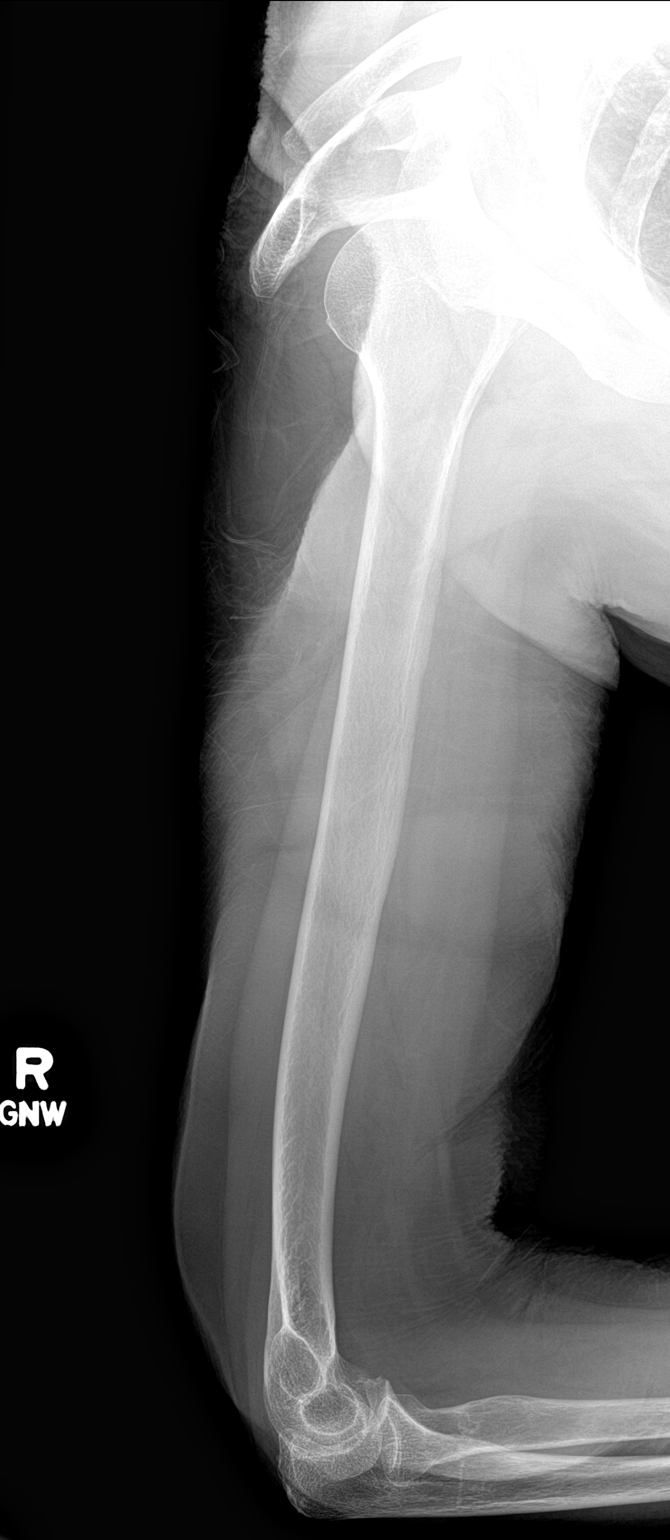

[humerus ap]
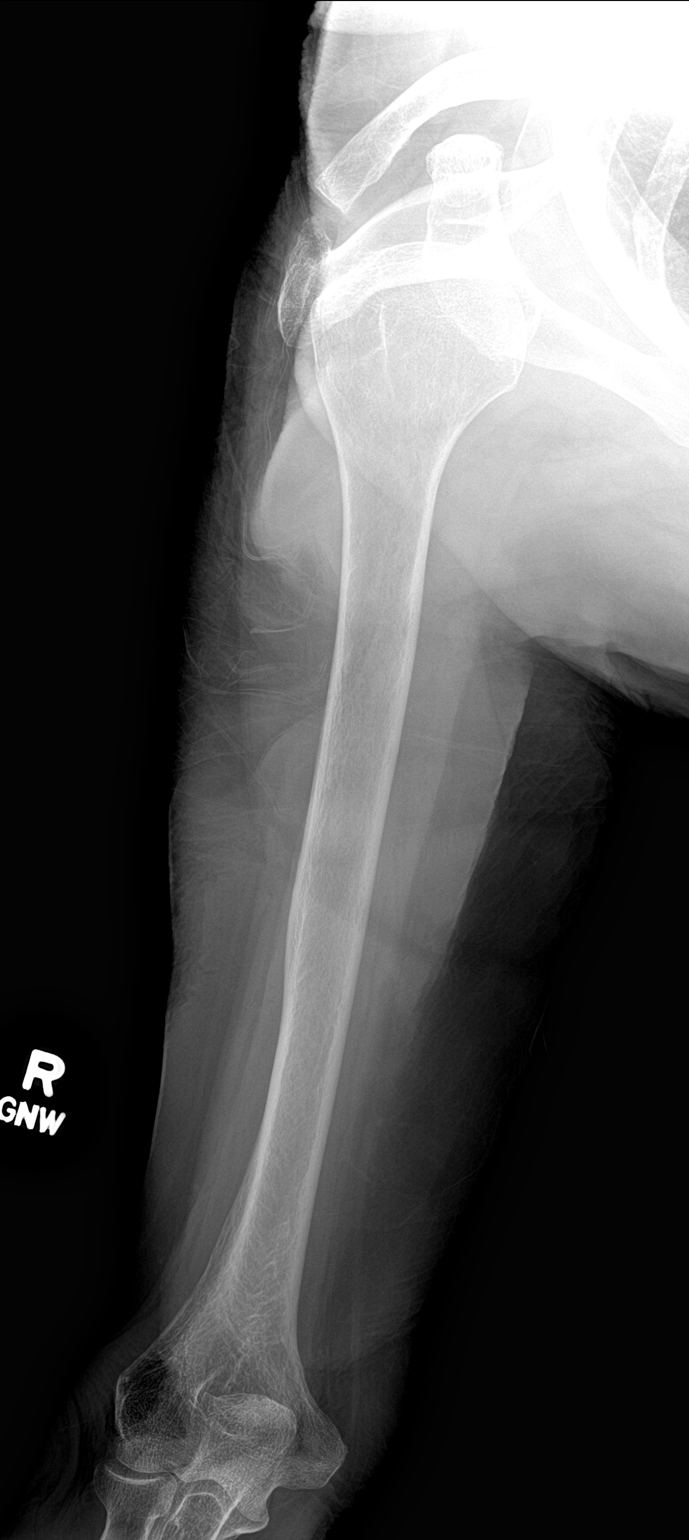

[2 of 2 positions shown; findings below may reference images not displayed]

FINDINGS: No fracture observed. The lateral projection raises the possibility
of an elbow joint effusion although this is equivocal. If the
patient has symptoms in the vicinity of the elbow, consider
dedicated elbow 0 radiography.
IMPRESSION: 1. No acute bony findings. Questionable elbow joint effusion.
Correlate with any symptoms in the vicinity of the elbow in
determining whether dedicated elbow radiography is warranted.

## 2016-07-13 IMAGING — MR MR LUMBAR SPINE W/O CM
4 of 11 series · 18 of 48 positions shown · non-contrast
Comparison: Lumbar spine radiographs 02/13/2014.

CLINICAL DATA: Back pain after a mechanical fall 02/12/2014.

EXAM:
MRI LUMBAR SPINE WITHOUT CONTRAST
TECHNIQUE: Multiplanar, multisequence MR imaging of the lumbar spine was
performed. No intravenous contrast was administered.

[Series 3: T2 · sagittal · 4.0mm · 0.55mm/px · 3 of 13 slices shown (1 of 2)]
[im 1/13]
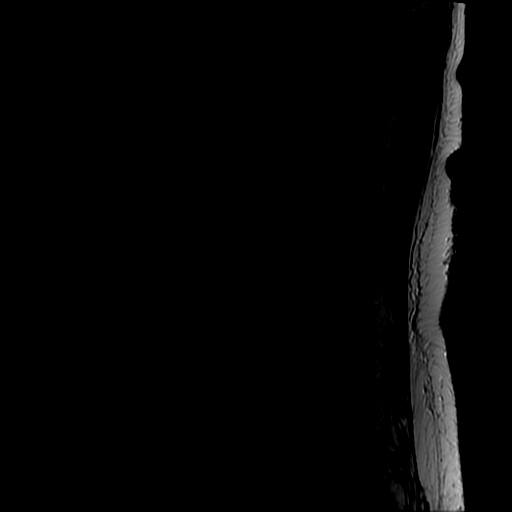
[im 7/13]
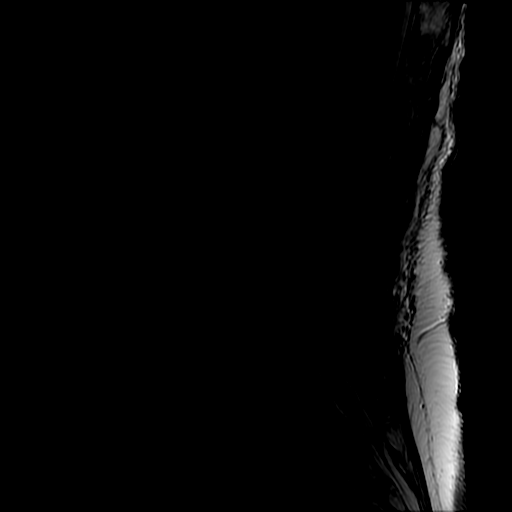
[im 13/13]
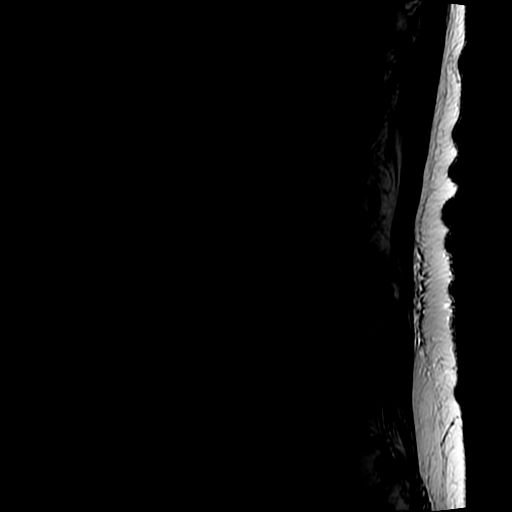

[Series 5: T1 · sagittal · 4.0mm · 0.55mm/px · 2 of 13 slices shown (1 of 2)]
[im 1/13]
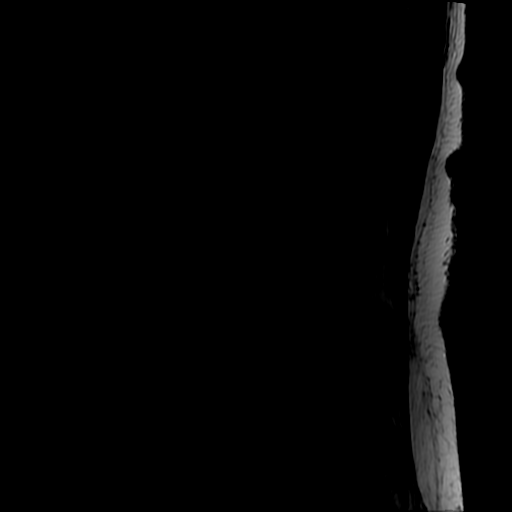
[im 13/13]
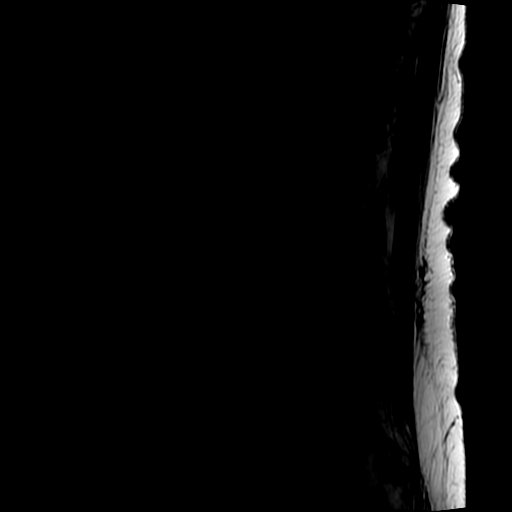

[Series 6: T2 · axial · 4.0mm · 0.39mm/px · z∈[-163,+43]mm · 7 of 37 slices shown (2 of 2)]
[im 1/37]
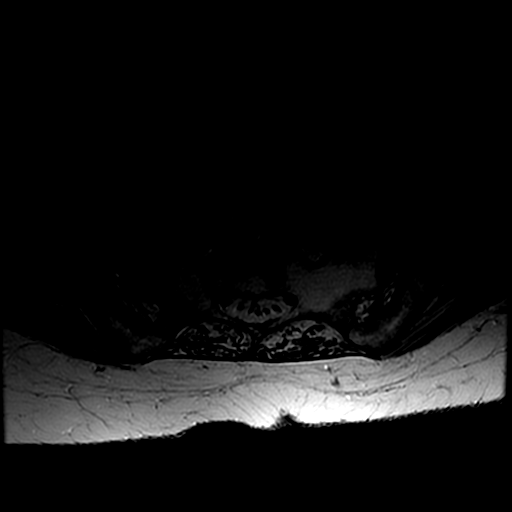
[im 7/37]
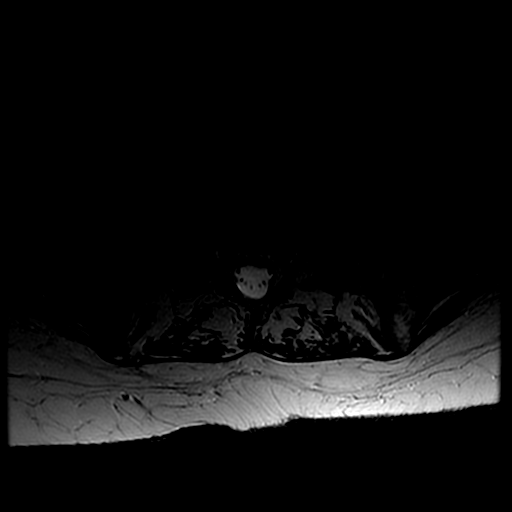
[im 13/37]
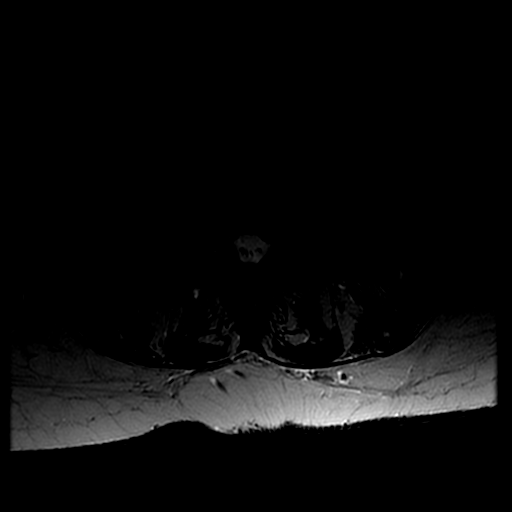
[im 19/37]
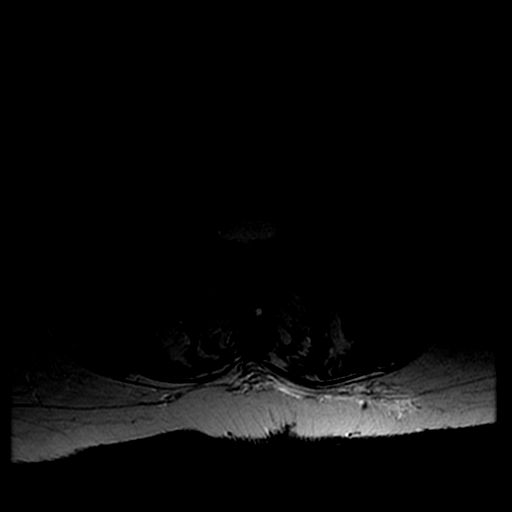
[im 25/37]
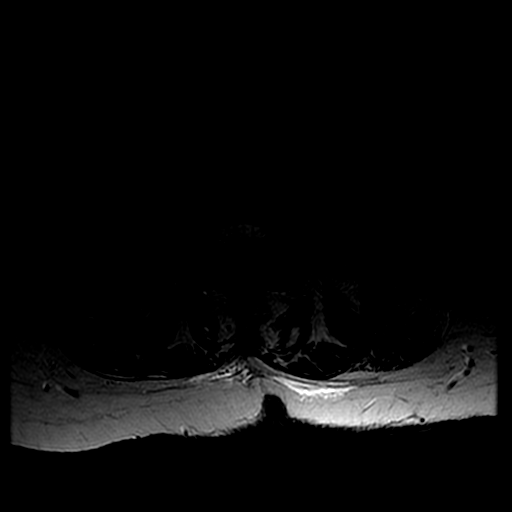
[im 31/37]
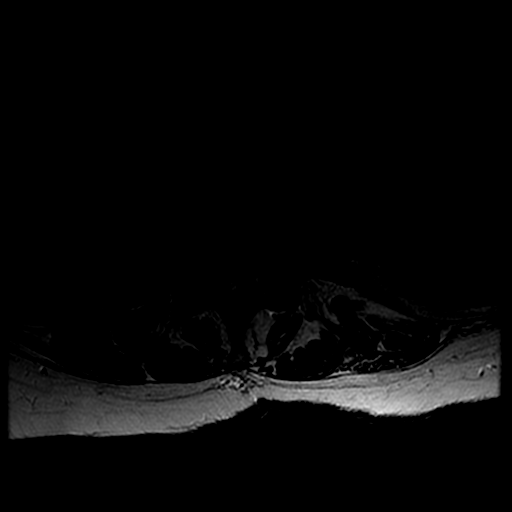
[im 37/37]
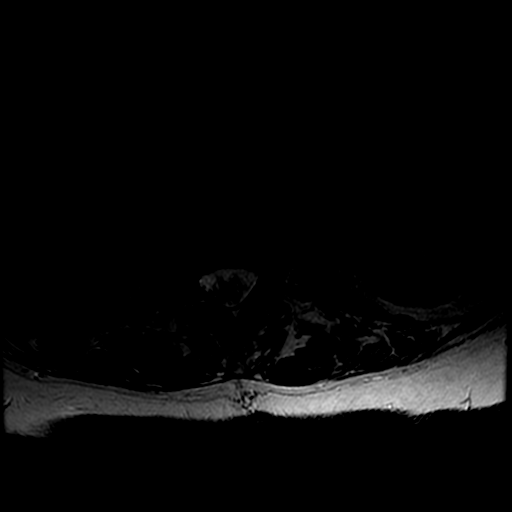

[Series 7: T1 · axial · 4.0mm · 0.39mm/px · z∈[-163,+13]mm · 6 of 37 slices shown (2 of 2)]
[im 1/37]
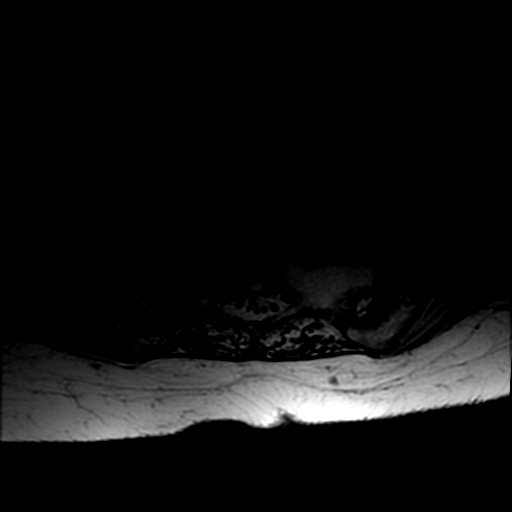
[im 7/37]
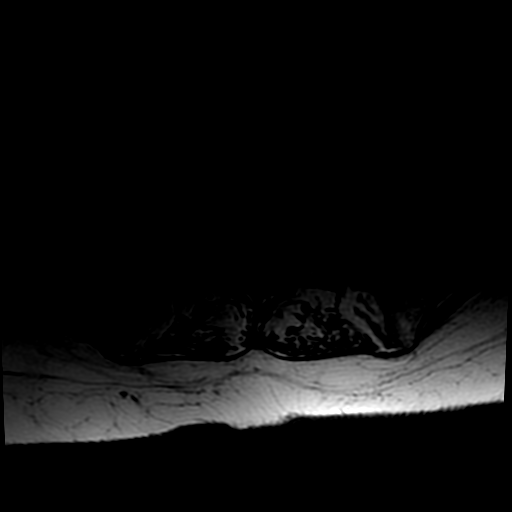
[im 13/37]
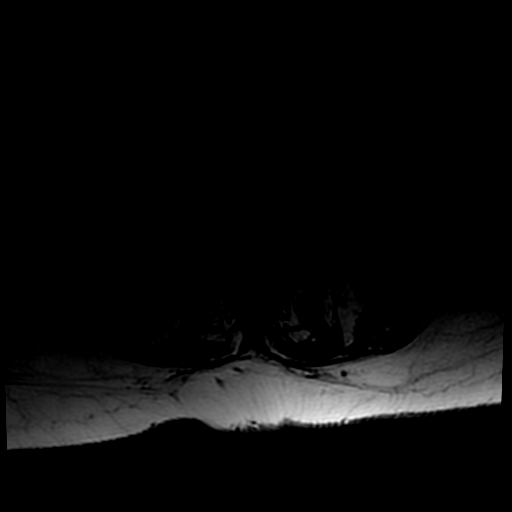
[im 19/37]
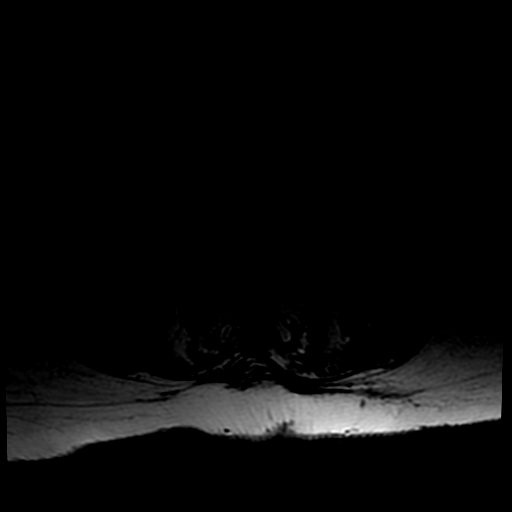
[im 25/37]
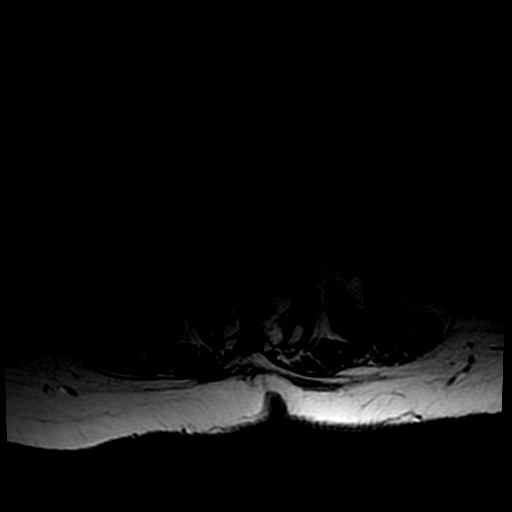
[im 31/37]
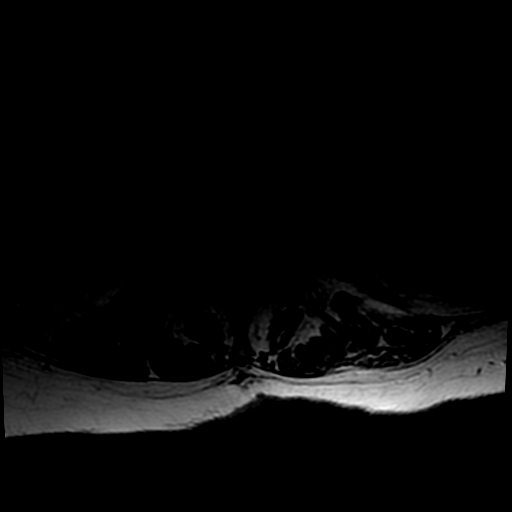

[18 of 48 positions shown; findings below may reference images not displayed]

FINDINGS: There is significant progression of a severe compression fracture is
present at L2. Retropulsed bone extends 7.5 mm into the canal. There
is severe central canal stenosis at the L2 level as a result. Mild
to moderate foraminal narrowing is present bilaterally at L1-2 and
L2-3. Vertebral body heights are otherwise maintained. There is a
superior endplate Schmorl's node at L4. Slight anterolisthesis is
present at L4-5.

A right renal cyst is present. A 3 cm septated right adnexal cyst is
noted. Limited imaging of the abdomen is otherwise unremarkable.
There is no significant adenopathy.

L3-4: A broad-based disc protrusion and mild facet hypertrophy
bilaterally results in mild left subarticular and bilateral
foraminal narrowing.

L4-5: Anterolisthesis results in uncovering of a broad-based disc
protrusion. Moderate facet hypertrophy is present. Moderate
subarticular stenosis is worse on the right. Mild foraminal
narrowing is also worse on the right.

L5-S1: There is chronic loss of disc height without a significant
stenosis.
IMPRESSION: 1. Progression of a now severe osteoporotic compression fracture at
L2 with significant retropulsion of bone and severe central canal
stenosis at the L2 level.
2. Mild to moderate foraminal narrowing bilaterally at L1-2 and
L2-3.
3. Mild left subarticular and bilateral foraminal narrowing at L3-4.
4. Grade 1 anterolisthesis with moderate serve radicular stenosis
bilaterally at L4-5, worse on the right.
5. Mild foraminal narrowing at L4-5 is worse on the right.
6. 3 cm septated right adnexal cyst. This is stable dating back to a
CT scan of 1901.
These results will be called to the ordering clinician or
representative by the Radiologist Assistant, and communication
documented in the PACS or zVision Dashboard.

## 2016-07-19 DIAGNOSIS — S32020A Wedge compression fracture of second lumbar vertebra, initial encounter for closed fracture: Secondary | ICD-10-CM | POA: Diagnosis not present

## 2016-07-19 DIAGNOSIS — M546 Pain in thoracic spine: Secondary | ICD-10-CM | POA: Diagnosis not present

## 2016-07-19 DIAGNOSIS — M5442 Lumbago with sciatica, left side: Secondary | ICD-10-CM | POA: Diagnosis not present

## 2016-08-10 DIAGNOSIS — M5442 Lumbago with sciatica, left side: Secondary | ICD-10-CM | POA: Diagnosis not present

## 2016-08-16 DIAGNOSIS — M5442 Lumbago with sciatica, left side: Secondary | ICD-10-CM | POA: Diagnosis not present

## 2016-08-16 DIAGNOSIS — S32020D Wedge compression fracture of second lumbar vertebra, subsequent encounter for fracture with routine healing: Secondary | ICD-10-CM | POA: Diagnosis not present

## 2016-08-30 DIAGNOSIS — R05 Cough: Secondary | ICD-10-CM | POA: Diagnosis not present

## 2016-10-18 DIAGNOSIS — R008 Other abnormalities of heart beat: Secondary | ICD-10-CM | POA: Diagnosis not present

## 2016-10-18 DIAGNOSIS — L602 Onychogryphosis: Secondary | ICD-10-CM | POA: Diagnosis not present

## 2016-10-18 DIAGNOSIS — R531 Weakness: Secondary | ICD-10-CM | POA: Diagnosis not present

## 2016-10-19 DIAGNOSIS — M5430 Sciatica, unspecified side: Secondary | ICD-10-CM | POA: Diagnosis not present

## 2016-10-19 DIAGNOSIS — R269 Unspecified abnormalities of gait and mobility: Secondary | ICD-10-CM | POA: Diagnosis not present

## 2016-10-23 DIAGNOSIS — M5430 Sciatica, unspecified side: Secondary | ICD-10-CM | POA: Diagnosis not present

## 2016-10-23 DIAGNOSIS — R269 Unspecified abnormalities of gait and mobility: Secondary | ICD-10-CM | POA: Diagnosis not present

## 2016-10-25 DIAGNOSIS — M5430 Sciatica, unspecified side: Secondary | ICD-10-CM | POA: Diagnosis not present

## 2016-10-25 DIAGNOSIS — R269 Unspecified abnormalities of gait and mobility: Secondary | ICD-10-CM | POA: Diagnosis not present

## 2016-10-30 DIAGNOSIS — R269 Unspecified abnormalities of gait and mobility: Secondary | ICD-10-CM | POA: Diagnosis not present

## 2016-10-30 DIAGNOSIS — M5430 Sciatica, unspecified side: Secondary | ICD-10-CM | POA: Diagnosis not present

## 2016-11-01 DIAGNOSIS — R269 Unspecified abnormalities of gait and mobility: Secondary | ICD-10-CM | POA: Diagnosis not present

## 2016-11-01 DIAGNOSIS — M5430 Sciatica, unspecified side: Secondary | ICD-10-CM | POA: Diagnosis not present

## 2016-11-08 DIAGNOSIS — R269 Unspecified abnormalities of gait and mobility: Secondary | ICD-10-CM | POA: Diagnosis not present

## 2016-11-08 DIAGNOSIS — M5430 Sciatica, unspecified side: Secondary | ICD-10-CM | POA: Diagnosis not present

## 2016-11-10 DIAGNOSIS — M5430 Sciatica, unspecified side: Secondary | ICD-10-CM | POA: Diagnosis not present

## 2016-11-10 DIAGNOSIS — R269 Unspecified abnormalities of gait and mobility: Secondary | ICD-10-CM | POA: Diagnosis not present

## 2016-11-13 DIAGNOSIS — M5430 Sciatica, unspecified side: Secondary | ICD-10-CM | POA: Diagnosis not present

## 2016-11-13 DIAGNOSIS — R269 Unspecified abnormalities of gait and mobility: Secondary | ICD-10-CM | POA: Diagnosis not present

## 2016-11-15 DIAGNOSIS — M5430 Sciatica, unspecified side: Secondary | ICD-10-CM | POA: Diagnosis not present

## 2016-11-15 DIAGNOSIS — R269 Unspecified abnormalities of gait and mobility: Secondary | ICD-10-CM | POA: Diagnosis not present

## 2016-11-20 DIAGNOSIS — R269 Unspecified abnormalities of gait and mobility: Secondary | ICD-10-CM | POA: Diagnosis not present

## 2016-11-20 DIAGNOSIS — M5430 Sciatica, unspecified side: Secondary | ICD-10-CM | POA: Diagnosis not present

## 2016-11-22 DIAGNOSIS — M5430 Sciatica, unspecified side: Secondary | ICD-10-CM | POA: Diagnosis not present

## 2016-11-22 DIAGNOSIS — R269 Unspecified abnormalities of gait and mobility: Secondary | ICD-10-CM | POA: Diagnosis not present

## 2016-11-27 DIAGNOSIS — R269 Unspecified abnormalities of gait and mobility: Secondary | ICD-10-CM | POA: Diagnosis not present

## 2016-11-27 DIAGNOSIS — M5430 Sciatica, unspecified side: Secondary | ICD-10-CM | POA: Diagnosis not present

## 2016-11-28 DIAGNOSIS — E119 Type 2 diabetes mellitus without complications: Secondary | ICD-10-CM | POA: Diagnosis not present

## 2016-11-28 DIAGNOSIS — I10 Essential (primary) hypertension: Secondary | ICD-10-CM | POA: Diagnosis not present

## 2016-11-28 DIAGNOSIS — I480 Paroxysmal atrial fibrillation: Secondary | ICD-10-CM | POA: Diagnosis not present

## 2016-11-28 DIAGNOSIS — R1012 Left upper quadrant pain: Secondary | ICD-10-CM | POA: Diagnosis not present

## 2016-11-29 DIAGNOSIS — R269 Unspecified abnormalities of gait and mobility: Secondary | ICD-10-CM | POA: Diagnosis not present

## 2016-11-29 DIAGNOSIS — R1084 Generalized abdominal pain: Secondary | ICD-10-CM | POA: Diagnosis not present

## 2016-11-29 DIAGNOSIS — M5430 Sciatica, unspecified side: Secondary | ICD-10-CM | POA: Diagnosis not present

## 2016-11-29 DIAGNOSIS — N390 Urinary tract infection, site not specified: Secondary | ICD-10-CM | POA: Diagnosis not present

## 2016-12-01 DIAGNOSIS — I10 Essential (primary) hypertension: Secondary | ICD-10-CM | POA: Diagnosis not present

## 2016-12-06 DIAGNOSIS — N390 Urinary tract infection, site not specified: Secondary | ICD-10-CM | POA: Diagnosis not present

## 2016-12-25 DIAGNOSIS — Z23 Encounter for immunization: Secondary | ICD-10-CM | POA: Diagnosis not present
